# Patient Record
Sex: Male | Born: 1945 | Race: White | Hispanic: No | Smoking: Former smoker
Health system: Southern US, Community
[De-identification: ages and names within clinical notes are randomized; demographics above are authoritative.]

## PROBLEM LIST (undated history)

## (undated) DIAGNOSIS — F1910 Other psychoactive substance abuse, uncomplicated: Secondary | ICD-10-CM

## (undated) DIAGNOSIS — K219 Gastro-esophageal reflux disease without esophagitis: Secondary | ICD-10-CM

## (undated) HISTORY — PX: JOINT REPLACEMENT: SHX530

---

## 2020-11-28 ENCOUNTER — Inpatient Hospital Stay
Admission: EM | Admit: 2020-11-28 | Discharge: 2020-11-28 | DRG: 871 | Disposition: A | Discharge status: Other Acute Inpatient Hospital | Payer: Medicare Other | Attending: Internal Medicine | Admitting: Internal Medicine

## 2020-11-28 ENCOUNTER — Emergency Department: Payer: Medicare Other

## 2020-11-28 ENCOUNTER — Inpatient Hospital Stay: Payer: Medicare Other

## 2020-11-28 ENCOUNTER — Other Ambulatory Visit: Payer: Self-pay

## 2020-11-28 ENCOUNTER — Encounter: Payer: Self-pay | Admitting: Emergency Medicine

## 2020-11-28 DIAGNOSIS — I482 Chronic atrial fibrillation, unspecified: Secondary | ICD-10-CM | POA: Diagnosis present

## 2020-11-28 DIAGNOSIS — E872 Acidosis, unspecified: Secondary | ICD-10-CM | POA: Diagnosis present

## 2020-11-28 DIAGNOSIS — Z888 Allergy status to other drugs, medicaments and biological substances status: Secondary | ICD-10-CM | POA: Diagnosis not present

## 2020-11-28 DIAGNOSIS — S2241XA Multiple fractures of ribs, right side, initial encounter for closed fracture: Secondary | ICD-10-CM | POA: Diagnosis present

## 2020-11-28 DIAGNOSIS — D696 Thrombocytopenia, unspecified: Secondary | ICD-10-CM | POA: Diagnosis present

## 2020-11-28 DIAGNOSIS — Z20822 Contact with and (suspected) exposure to covid-19: Secondary | ICD-10-CM | POA: Diagnosis present

## 2020-11-28 DIAGNOSIS — Z87891 Personal history of nicotine dependence: Secondary | ICD-10-CM

## 2020-11-28 DIAGNOSIS — Z7901 Long term (current) use of anticoagulants: Secondary | ICD-10-CM | POA: Diagnosis not present

## 2020-11-28 DIAGNOSIS — W19XXXA Unspecified fall, initial encounter: Secondary | ICD-10-CM | POA: Diagnosis present

## 2020-11-28 DIAGNOSIS — K219 Gastro-esophageal reflux disease without esophagitis: Secondary | ICD-10-CM | POA: Diagnosis present

## 2020-11-28 DIAGNOSIS — S27391A Other injuries of lung, unilateral, initial encounter: Secondary | ICD-10-CM | POA: Diagnosis present

## 2020-11-28 DIAGNOSIS — I4891 Unspecified atrial fibrillation: Secondary | ICD-10-CM | POA: Diagnosis present

## 2020-11-28 DIAGNOSIS — R410 Disorientation, unspecified: Secondary | ICD-10-CM | POA: Diagnosis not present

## 2020-11-28 DIAGNOSIS — Z880 Allergy status to penicillin: Secondary | ICD-10-CM

## 2020-11-28 DIAGNOSIS — A419 Sepsis, unspecified organism: Secondary | ICD-10-CM | POA: Diagnosis present

## 2020-11-28 DIAGNOSIS — S271XXA Traumatic hemothorax, initial encounter: Secondary | ICD-10-CM | POA: Diagnosis present

## 2020-11-28 DIAGNOSIS — R296 Repeated falls: Secondary | ICD-10-CM

## 2020-11-28 DIAGNOSIS — I700 Atherosclerosis of aorta: Secondary | ICD-10-CM | POA: Diagnosis present

## 2020-11-28 DIAGNOSIS — Z91013 Allergy to seafood: Secondary | ICD-10-CM

## 2020-11-28 DIAGNOSIS — F1010 Alcohol abuse, uncomplicated: Secondary | ICD-10-CM | POA: Diagnosis present

## 2020-11-28 DIAGNOSIS — Z66 Do not resuscitate: Secondary | ICD-10-CM | POA: Diagnosis present

## 2020-11-28 DIAGNOSIS — J948 Other specified pleural conditions: Secondary | ICD-10-CM

## 2020-11-28 DIAGNOSIS — R4182 Altered mental status, unspecified: Secondary | ICD-10-CM | POA: Diagnosis present

## 2020-11-28 DIAGNOSIS — I10 Essential (primary) hypertension: Secondary | ICD-10-CM | POA: Diagnosis present

## 2020-11-28 DIAGNOSIS — J942 Hemothorax: Secondary | ICD-10-CM

## 2020-11-28 HISTORY — DX: Gastro-esophageal reflux disease without esophagitis: K21.9

## 2020-11-28 HISTORY — DX: Other psychoactive substance abuse, uncomplicated: F19.10

## 2020-11-28 LAB — URINALYSIS, ROUTINE W REFLEX MICROSCOPIC
Bacteria, UA: NONE SEEN
Bilirubin Urine: NEGATIVE
Glucose, UA: 50 mg/dL — AB
Ketones, ur: 80 mg/dL — AB
Leukocytes,Ua: NEGATIVE
Nitrite: NEGATIVE
Protein, ur: 100 mg/dL — AB
Specific Gravity, Urine: 1.027 (ref 1.005–1.030)
pH: 5 (ref 5.0–8.0)

## 2020-11-28 LAB — COMPREHENSIVE METABOLIC PANEL
ALT: 41 U/L (ref 0–44)
AST: 41 U/L (ref 15–41)
Albumin: 3.5 g/dL (ref 3.5–5.0)
Alkaline Phosphatase: 50 U/L (ref 38–126)
Anion gap: 17 — ABNORMAL HIGH (ref 5–15)
BUN: 25 mg/dL — ABNORMAL HIGH (ref 8–23)
CO2: 21 mmol/L — ABNORMAL LOW (ref 22–32)
Calcium: 9.4 mg/dL (ref 8.9–10.3)
Chloride: 99 mmol/L (ref 98–111)
Creatinine, Ser: 1.07 mg/dL (ref 0.61–1.24)
GFR, Estimated: >60 mL/min (ref >60)
Glucose, Bld: 154 mg/dL — ABNORMAL HIGH (ref 70–99)
Potassium: 4.1 mmol/L (ref 3.5–5.1)
Sodium: 137 mmol/L (ref 135–145)
Total Bilirubin: 3 mg/dL — ABNORMAL HIGH (ref 0.3–1.2)
Total Protein: 7.5 g/dL (ref 6.5–8.1)

## 2020-11-28 LAB — LIPID PANEL
Cholesterol: 113 mg/dL (ref 0–200)
HDL: 57 mg/dL (ref >40)
LDL Cholesterol: 43 mg/dL (ref 0–99)
Total CHOL/HDL Ratio: 2 RATIO
Triglycerides: 66 mg/dL (ref <150)
VLDL: 13 mg/dL (ref 0–40)

## 2020-11-28 LAB — CK: Total CK: 359 U/L (ref 49–397)

## 2020-11-28 LAB — CBC WITH DIFFERENTIAL/PLATELET
Abs Immature Granulocytes: 0.12 10*3/uL — ABNORMAL HIGH (ref 0.00–0.07)
Basophils Absolute: 0 10*3/uL (ref 0.0–0.1)
Basophils Relative: 0 %
Eosinophils Absolute: 0 10*3/uL (ref 0.0–0.5)
Eosinophils Relative: 0 %
HCT: 35.8 % — ABNORMAL LOW (ref 39.0–52.0)
Hemoglobin: 12.3 g/dL — ABNORMAL LOW (ref 13.0–17.0)
Immature Granulocytes: 1 %
Lymphocytes Relative: 7 %
Lymphs Abs: 0.7 10*3/uL (ref 0.7–4.0)
MCH: 35.4 pg — ABNORMAL HIGH (ref 26.0–34.0)
MCHC: 34.4 g/dL (ref 30.0–36.0)
MCV: 103.2 fL — ABNORMAL HIGH (ref 80.0–100.0)
Monocytes Absolute: 1 10*3/uL (ref 0.1–1.0)
Monocytes Relative: 10 %
Neutro Abs: 7.7 10*3/uL (ref 1.7–7.7)
Neutrophils Relative %: 82 %
Platelets: 133 10*3/uL — ABNORMAL LOW (ref 150–400)
RBC: 3.47 MIL/uL — ABNORMAL LOW (ref 4.22–5.81)
RDW: 15.4 % (ref 11.5–15.5)
WBC: 9.5 10*3/uL (ref 4.0–10.5)
nRBC: 0 % (ref 0.0–0.2)

## 2020-11-28 LAB — TROPONIN I (HIGH SENSITIVITY)
Troponin I (High Sensitivity): 43 ng/L — ABNORMAL HIGH (ref <18)
Troponin I (High Sensitivity): 36 ng/L — ABNORMAL HIGH (ref <18)

## 2020-11-28 LAB — RESP PANEL BY RT-PCR (FLU A&B, COVID) ARPGX2
Influenza A by PCR: NEGATIVE
Influenza B by PCR: NEGATIVE
SARS Coronavirus 2 by RT PCR: NEGATIVE

## 2020-11-28 LAB — LACTIC ACID, PLASMA
Lactic Acid, Venous: 3.2 mmol/L (ref 0.5–1.9)
Lactic Acid, Venous: 1.6 mmol/L (ref 0.5–1.9)

## 2020-11-28 LAB — TSH: TSH: 1.095 u[IU]/mL (ref 0.350–4.500)

## 2020-11-28 LAB — ETHANOL: Alcohol, Ethyl (B): <10 mg/dL (ref <10)

## 2020-11-28 IMAGING — MR MR HEAD W/O CM
11 series · 44 of 48 positions shown · non-contrast
Comparison: Prior head CT from earlier the same day.

CLINICAL DATA: Follow-up examination for acute stroke.  Found down.

EXAM:
MRI HEAD WITHOUT CONTRAST
TECHNIQUE: Multiplanar, multiecho pulse sequences of the brain and surrounding
structures were obtained without intravenous contrast.

[Series 9: ax dwi_tracew · axial · 3.0mm · 0.65mm/px · z∈[-132,+21]mm · 4 of 48 slices shown]
[im 1/48]
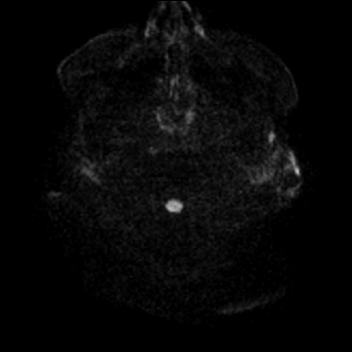
[im 16/48]
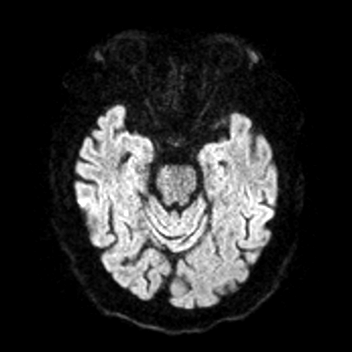
[im 32/48]
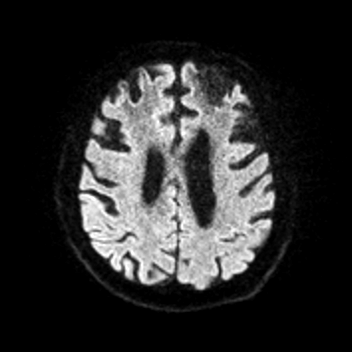
[im 48/48]
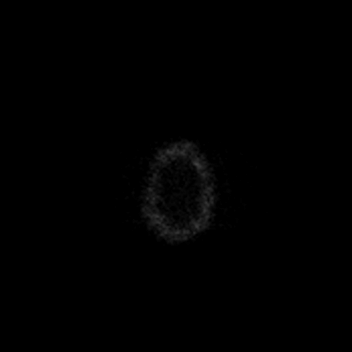

[Series 10: ax dwi_adc · axial · 3.0mm · 0.65mm/px · z∈[-132,+21]mm · 4 of 48 slices shown]
[im 1/48]
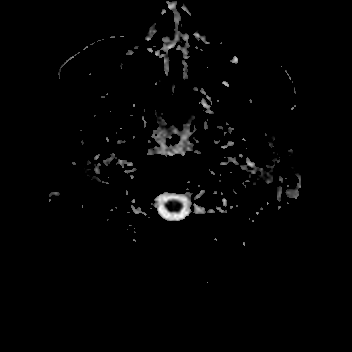
[im 16/48]
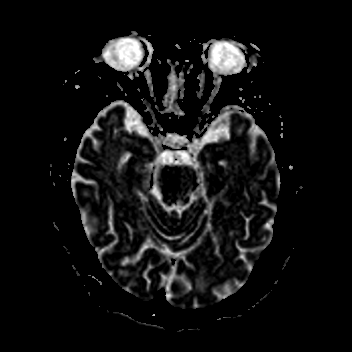
[im 32/48]
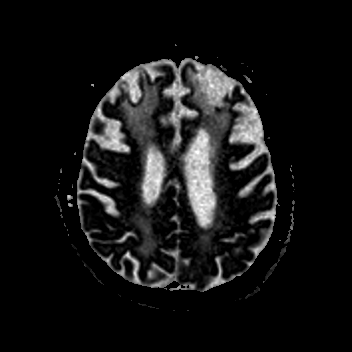
[im 48/48]
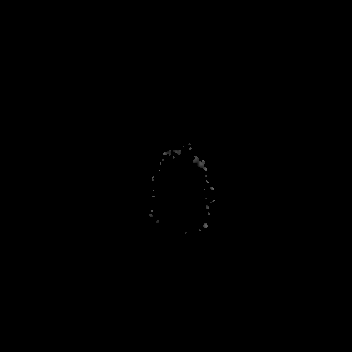

[Series 11: cor dwi_tracew · coronal · 5.0mm · 0.65mm/px · 3 of 40 slices shown]
[im 1/40]
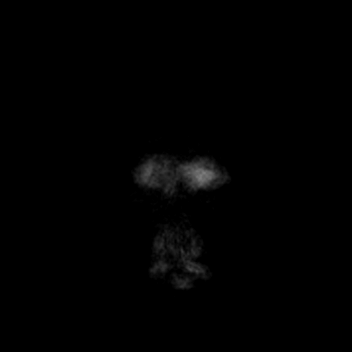
[im 20/40]
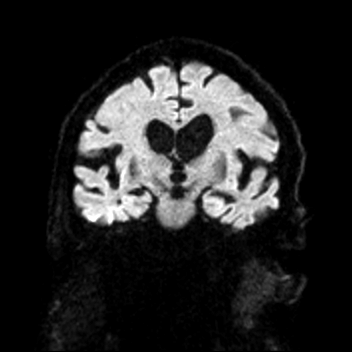
[im 40/40]
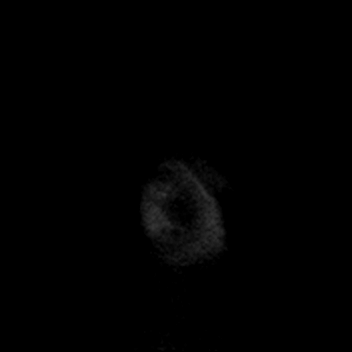

[Series 12: cor dwi_adc · coronal · 5.0mm · 0.65mm/px · 3 of 37 slices shown]
[im 1/37]
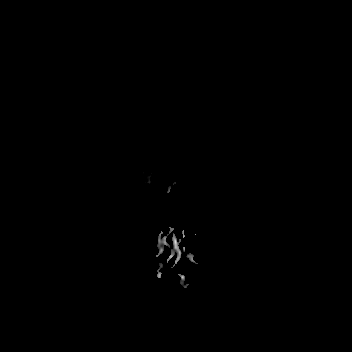
[im 19/37]
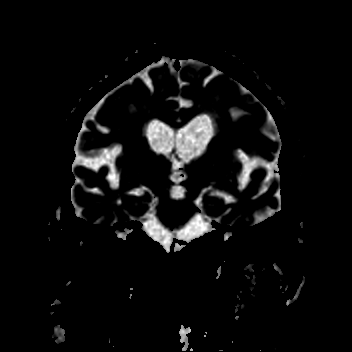
[im 37/37]
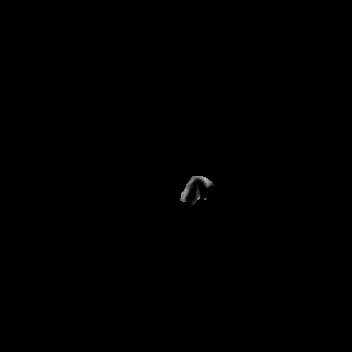

[Series 13: T1 · sagittal · 5.0mm · 0.62mm/px · 2 of 25 slices shown (1 of 2)]
[im 1/25]
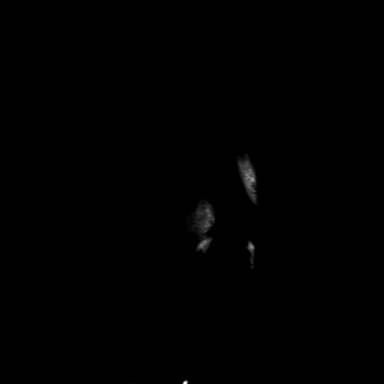
[im 25/25]
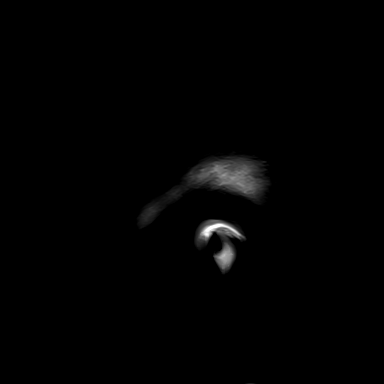

[Series 14: T2 · axial · 5.0mm · 0.53mm/px · z∈[-131,+23]mm · 2 of 27 slices shown (1 of 2)]
[im 1/27]
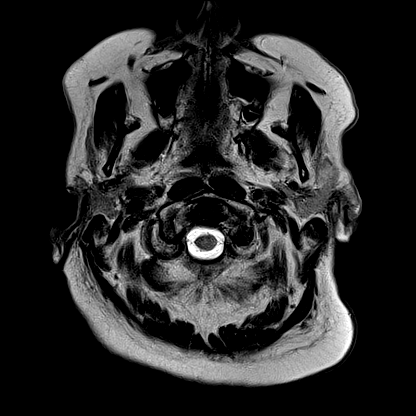
[im 27/27]
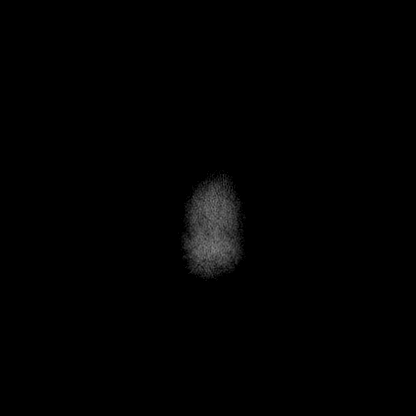

[Series 16: pha_images · axial · 3.0mm · 0.90mm/px · z∈[-142,+27]mm · 5 of 57 slices shown]
[im 1/57]
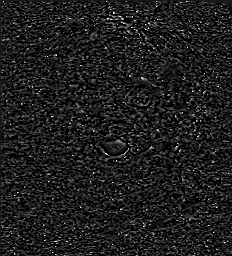
[im 15/57]
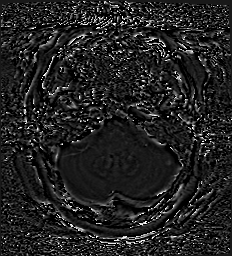
[im 29/57]
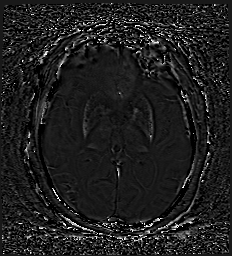
[im 43/57]
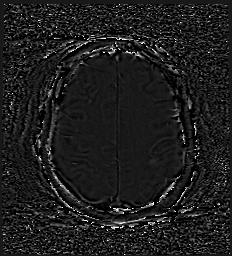
[im 57/57]
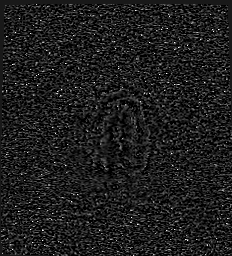

[Series 17: swi_images · axial · 3.0mm · 0.90mm/px · z∈[-142,+33]mm · 5 of 60 slices shown]
[im 1/60]
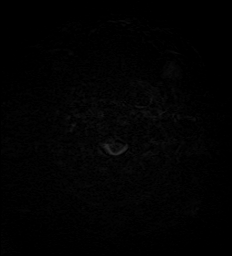
[im 15/60]
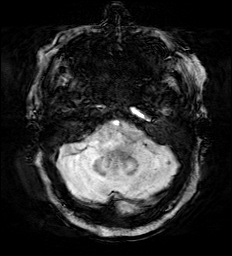
[im 30/60]
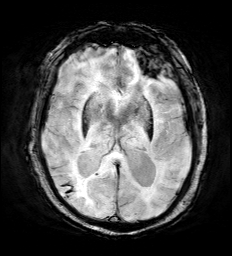
[im 45/60]
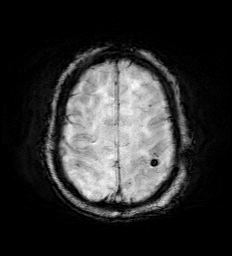
[im 60/60]
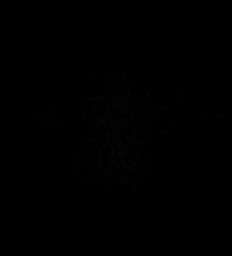

[Series 19: FLAIR · axial · 3.0mm · 0.53mm/px · z∈[-134,+26]mm · 4 of 55 slices shown]
[im 1/55]
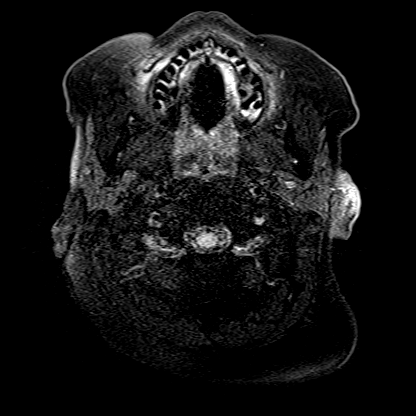
[im 19/55]
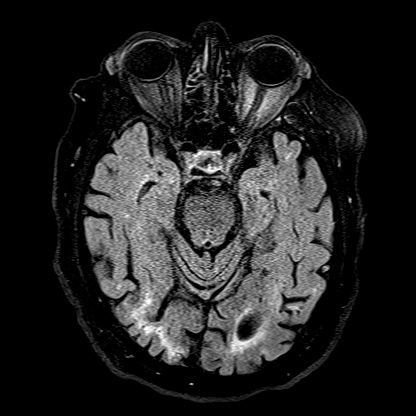
[im 37/55]
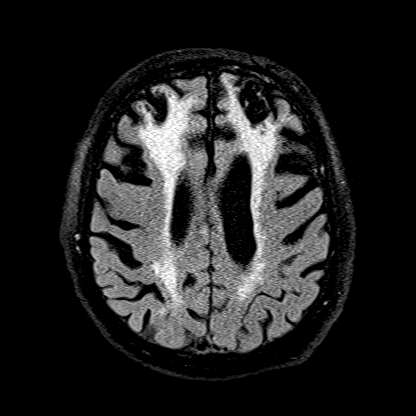
[im 55/55]
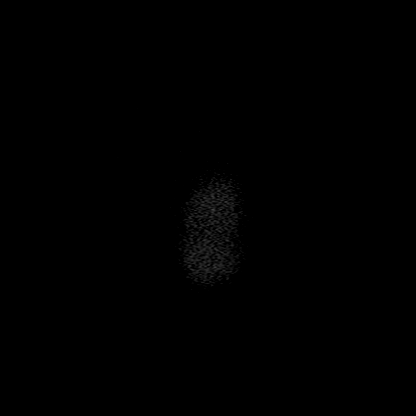

[Series 20: T1 · axial · 1.0mm · 0.98mm/px · z∈[-143,+30]mm · 10 of 176 slices shown (2 of 2)]
[im 1/176]
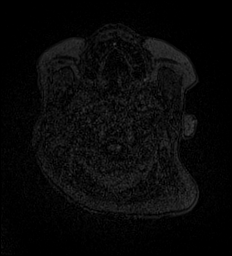
[im 14/176]
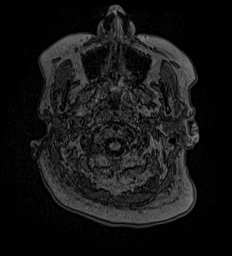
[im 27/176]
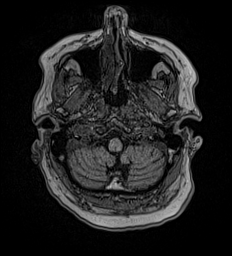
[im 41/176]
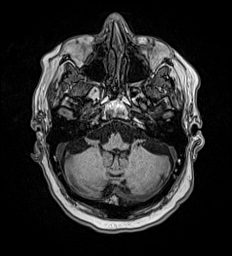
[im 54/176]
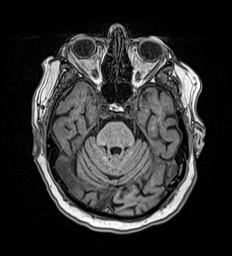
[im 81/176]
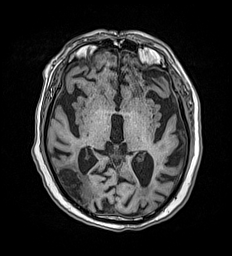
[im 95/176]
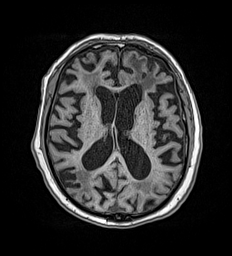
[im 122/176]
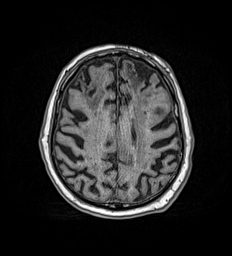
[im 149/176]
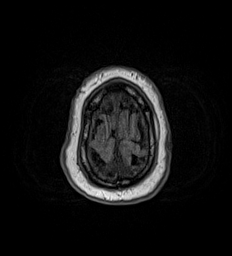
[im 176/176]
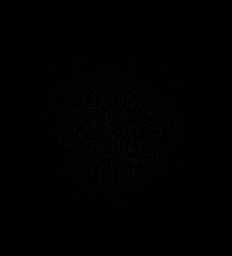

[Series 21: T2 · coronal · 5.0mm · 0.57mm/px · 2 of 30 slices shown (2 of 2)]
[im 1/30]
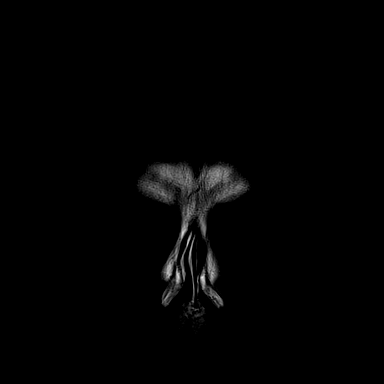
[im 30/30]
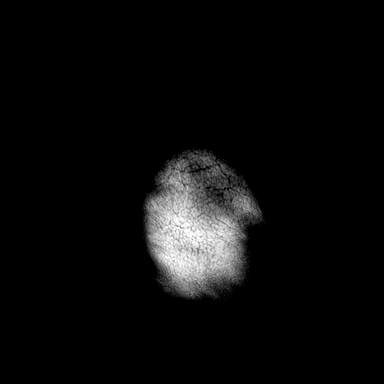

[44 of 48 positions shown; findings below may reference images not displayed]

FINDINGS: Brain: Diffuse prominence of the CSF containing spaces compatible
with generalized cerebral atrophy, moderately advanced in nature.
Extensive patchy and confluent T2/FLAIR hyperintensity involving the
periventricular and deep white matter both cerebral hemispheres most
consistent with chronic small vessel ischemic disease, moderate to
advanced in nature. Multiple scattered areas of encephalomalacia and
gliosis seen involving the bilateral frontal and right parietal
lobes, consistent with chronic ischemic infarcts. Few small remote
bilateral cerebellar infarcts noted, left greater than right.
Scattered chronic hemosiderin staining noted about several of these
chronic infarcts. Note made of a few additional small remote lacunar
infarcts about the deep gray nuclei.

Two small foci of restricted diffusion measuring up to 1 cm seen
involving the left cerebellum, consistent with acute ischemic
infarcts (series 9, image 9). No associated hemorrhage or mass
effect. No other evidence for acute or subacute ischemia. Gray-white
matter differentiation otherwise maintained. No acute intracranial
hemorrhage. Few additional chronic micro hemorrhages noted,
nonspecific, but could be hypertensive and/or small vessel related.

No mass lesion, midline shift or mass effect. Mild ventricular
prominence related to global parenchymal volume loss of
hydrocephalus. No extra-axial fluid collection. Pituitary gland and
suprasellar region normal.

Vascular: Major intracranial vascular flow voids are maintained.

Skull and upper cervical spine: Craniocervical junction normal. Bone
marrow signal intensity within normal limits. No scalp soft tissue
abnormality.

Sinuses/Orbits: Globes and orbital soft tissues within normal
limits. Paranasal sinuses are clear. No significant mastoid
effusion. Inner ear structures grossly normal.

Other: None.
IMPRESSION: 1. Two small acute ischemic nonhemorrhagic left cerebellar infarcts
measuring up to 1 cm.
2. Moderately advanced cerebral atrophy with underlying advanced
chronic microvascular ischemic disease with multiple remote infarcts
as above.

## 2020-11-28 IMAGING — CT CT HEAD W/O CM
3 series · 16 of 47 positions shown, 19 images · non-contrast
Comparison: None.

CLINICAL DATA: Syncope.  Normal neuro exam

EXAM:
CT HEAD WITHOUT CONTRAST
TECHNIQUE: Contiguous axial images were obtained from the base of the skull
through the vertex without intravenous contrast.

[Series 2: head wo · axial · 0.43mm/px · z∈[-95,+40]mm · 10 of 33 slices shown, 13 images]
[im 3/33  brain]
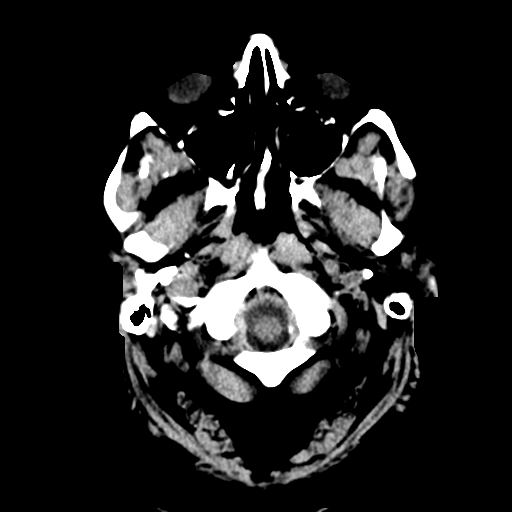
[im 3/33  bone]
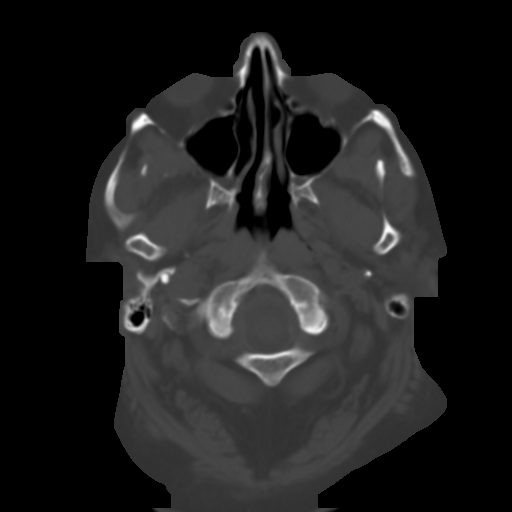
[im 6/33  brain]
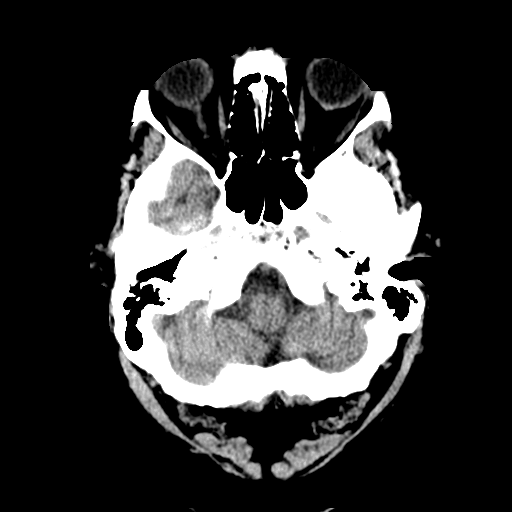
[im 9/33  brain]
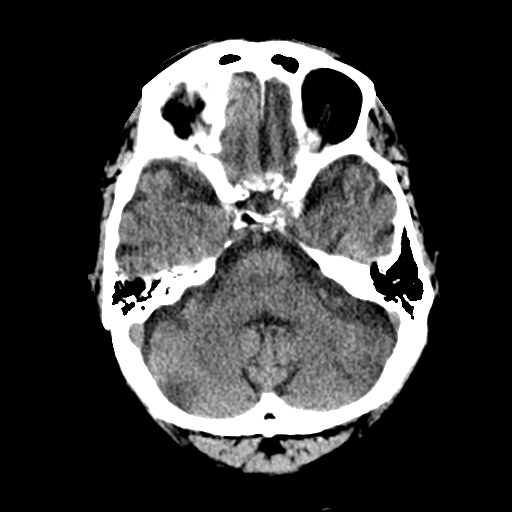
[im 12/33  brain]
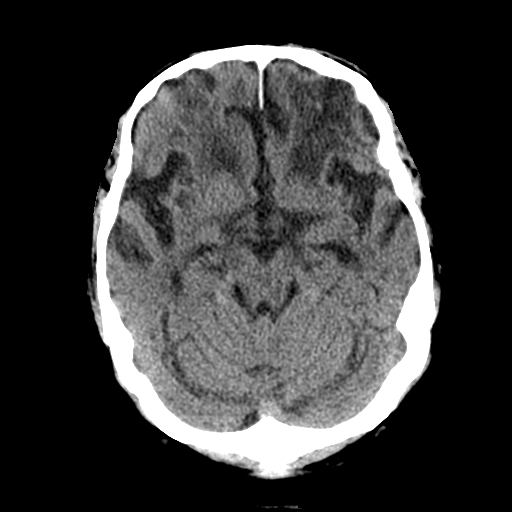
[im 15/33  brain]
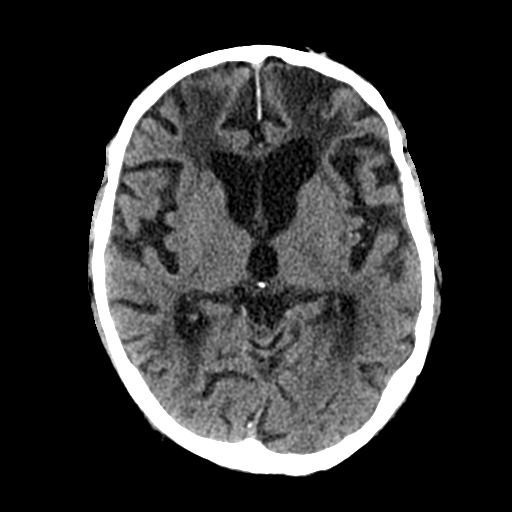
[im 15/33  bone]
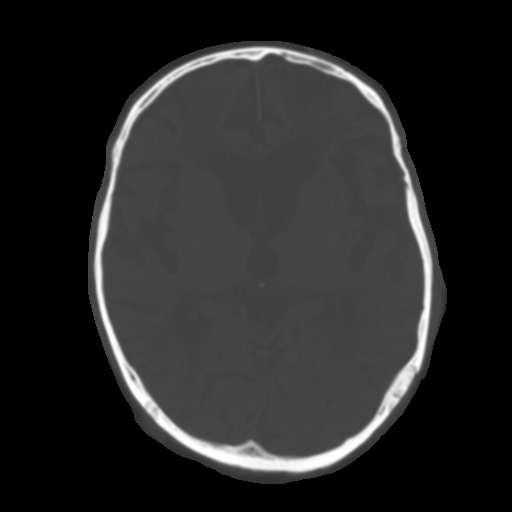
[im 18/33  brain]
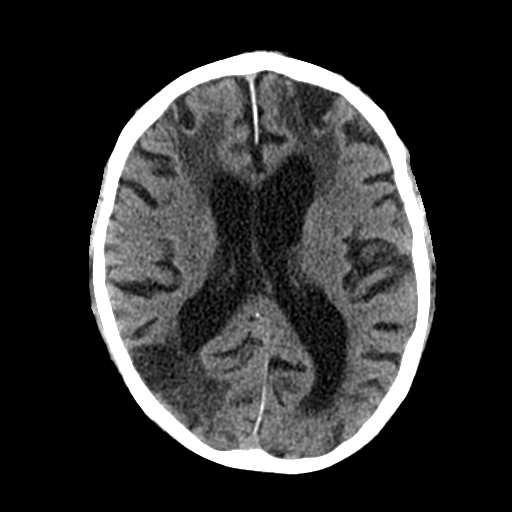
[im 21/33  brain]
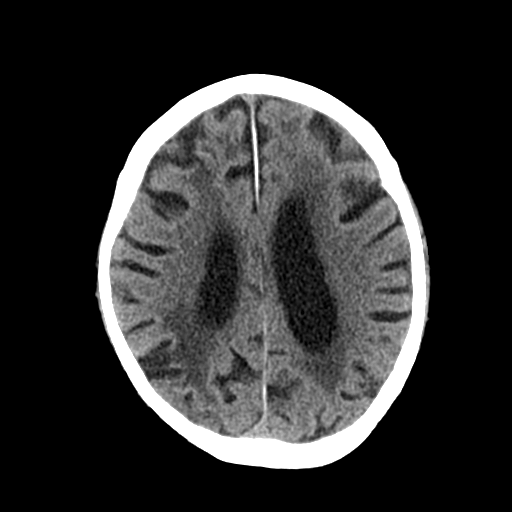
[im 25/33  brain]
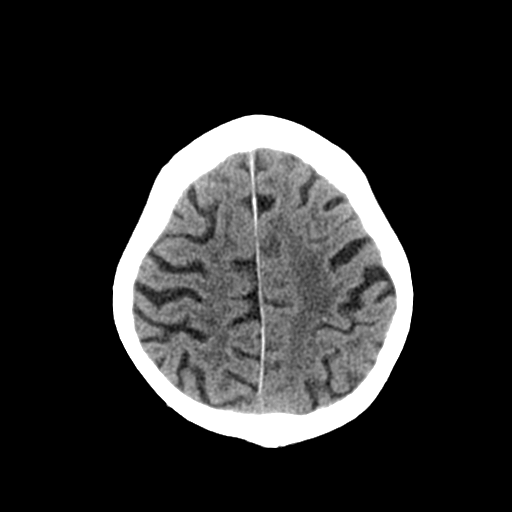
[im 27/33  brain]
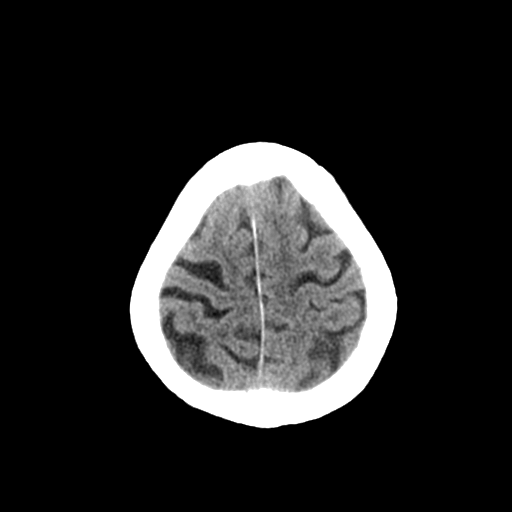
[im 27/33  bone]
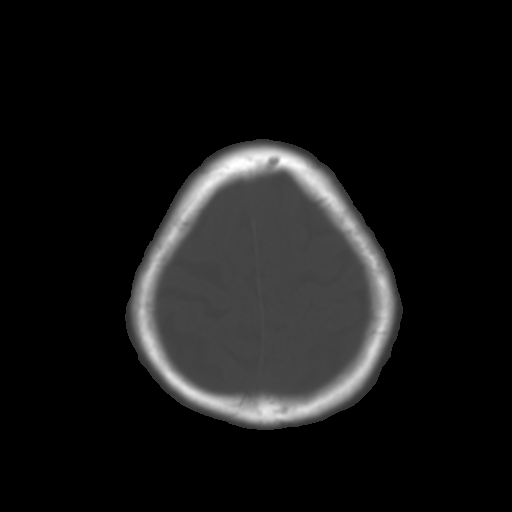
[im 30/33  brain]
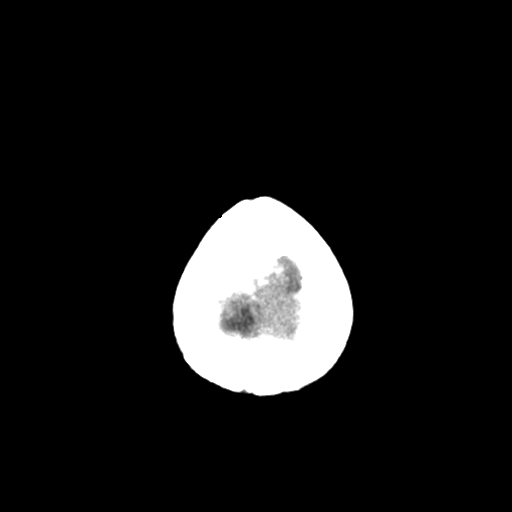

[Series 4: coronal soft tissue · coronal · 0.32mm/px · 3 of 69 slices shown]
[im 23/69  brain]
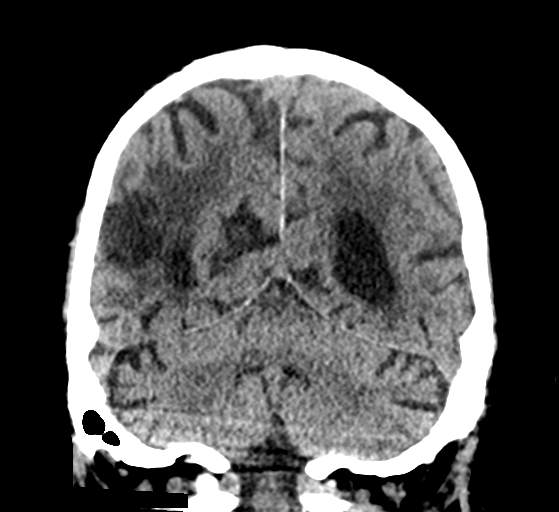
[im 31/69  brain]
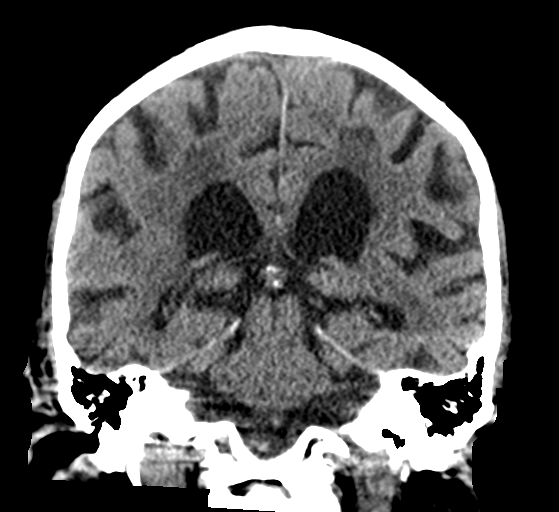
[im 38/69  brain]
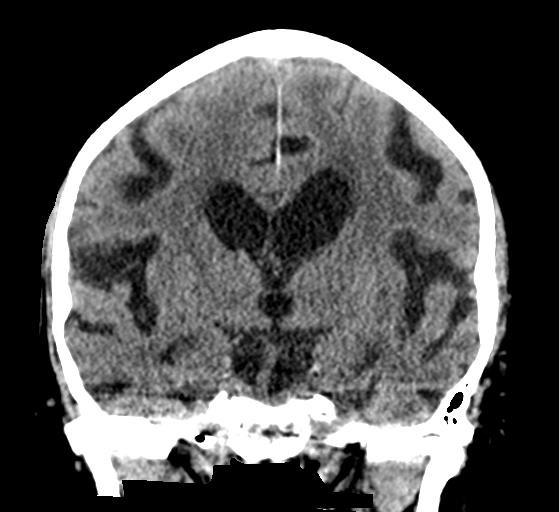

[Series 5: sagittal soft tissue · sagittal · 0.32mm/px · 3 of 60 slices shown]
[im 20/60  brain]
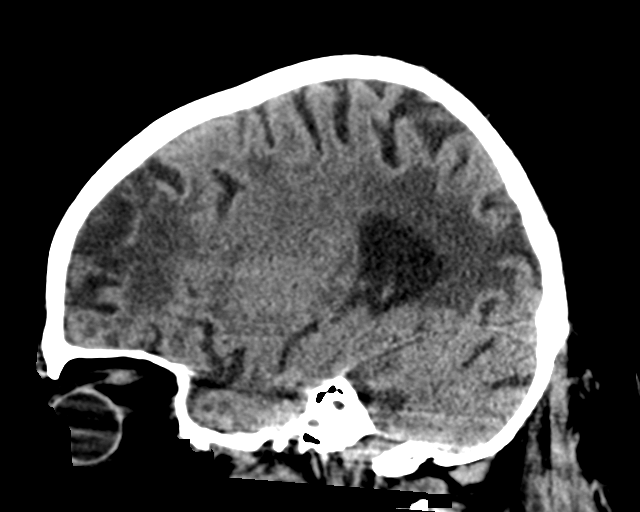
[im 30/60  brain]
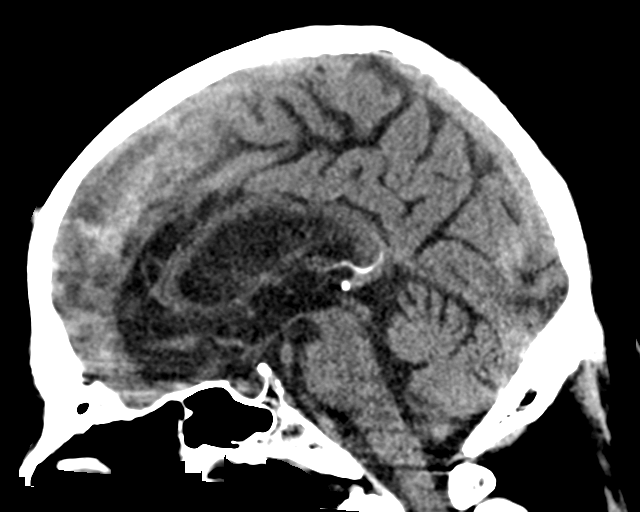
[im 40/60  brain]
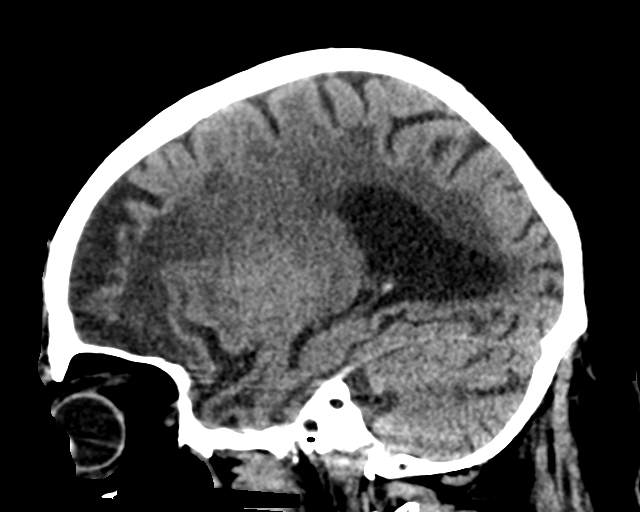

[16 of 47 positions shown; findings below may reference images not displayed]

FINDINGS: Brain: Moderate to advanced atrophy. Ventricular enlargement
consistent with the degree of atrophy.

Hypodensities in the frontal lobes bilaterally compatible with
chronic infarct. Hypodensity in the right parietal cortex also
consistent with chronic infarct. Hypodensity throughout the cerebral
white matter bilaterally compatible chronic ischemia.

Negative for acute cortical infarct, hemorrhage, mass.

Vascular: Negative for hyperdense vessel

Skull: Negative

Sinuses/Orbits: Negative

Other: None
IMPRESSION: Moderate to advanced atrophy.  Extensive chronic ischemic changes.

No acute abnormality detected.

## 2020-11-28 IMAGING — CT CT CHEST W/O CM
2 of 3 series · 15 of 36 positions shown, 18 images · non-contrast
Comparison: None.

CLINICAL DATA: chest trauma, minor.

EXAM:
CT CHEST WITHOUT CONTRAST
TECHNIQUE: Multidetector CT imaging of the chest was performed following the
standard protocol without IV contrast.

[Series 2: thorax · axial · 0.78mm/px · z∈[-501,-225]mm · 12 of 163 slices shown, 15 images]
[im 13/163  mediastinal]
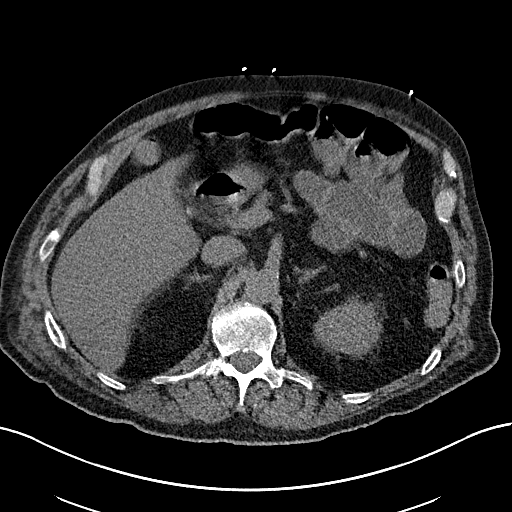
[im 13/163  lung]
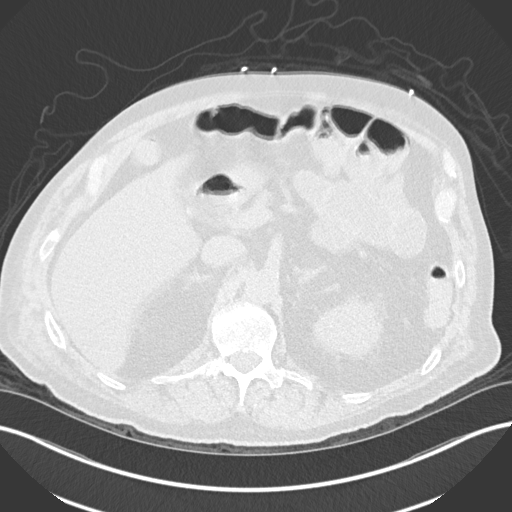
[im 25/163  lung]
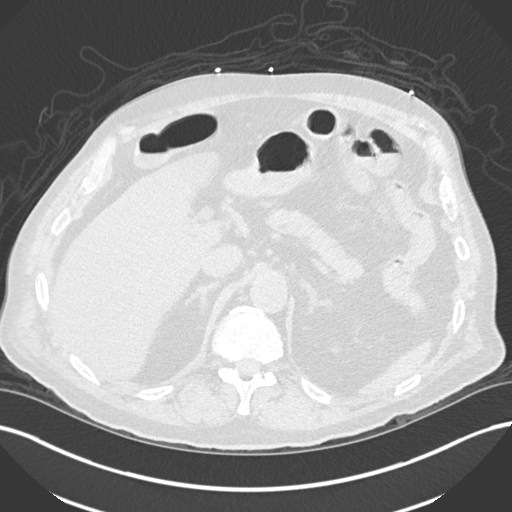
[im 37/163  lung]
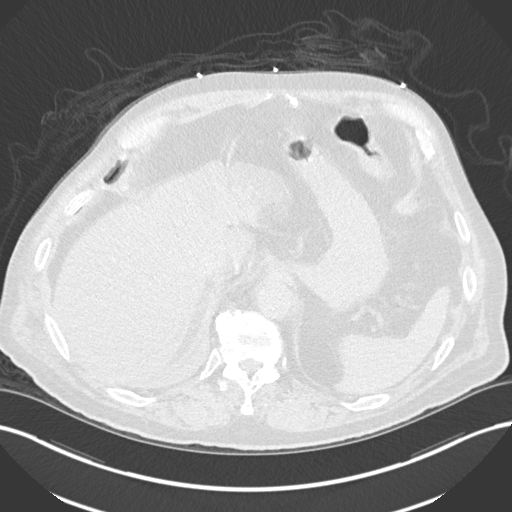
[im 49/163  lung]
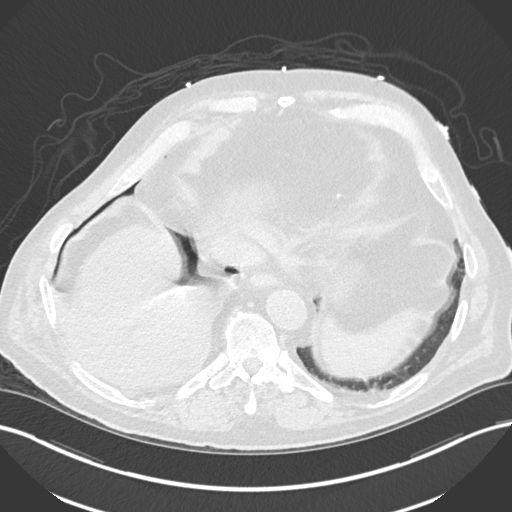
[im 61/163  mediastinal]
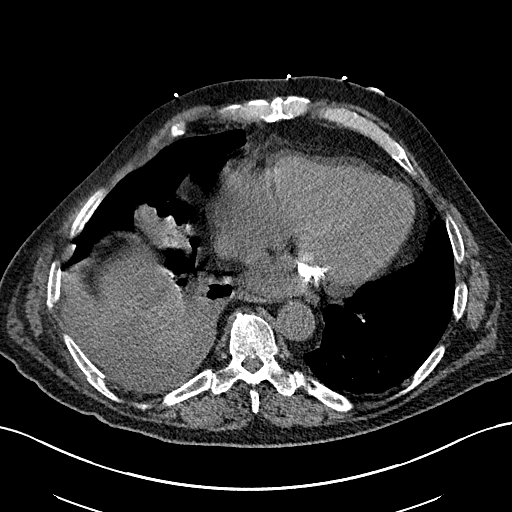
[im 61/163  lung]
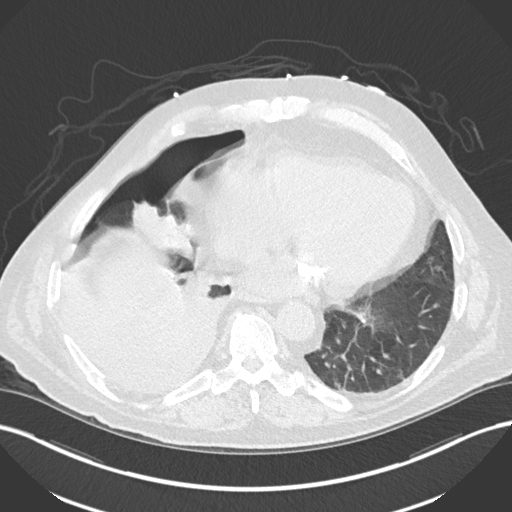
[im 73/163  lung]
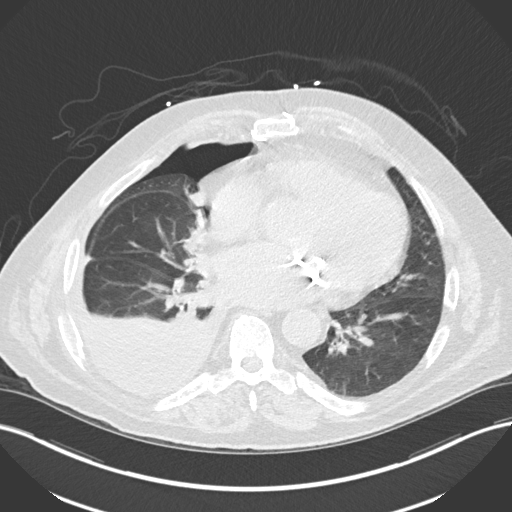
[im 91/163  lung]
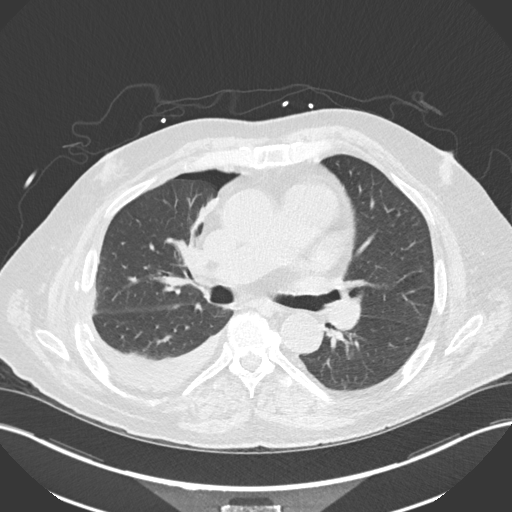
[im 103/163  lung]
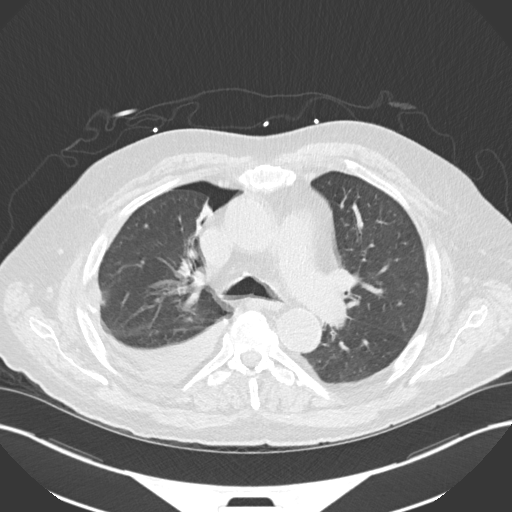
[im 115/163  mediastinal]
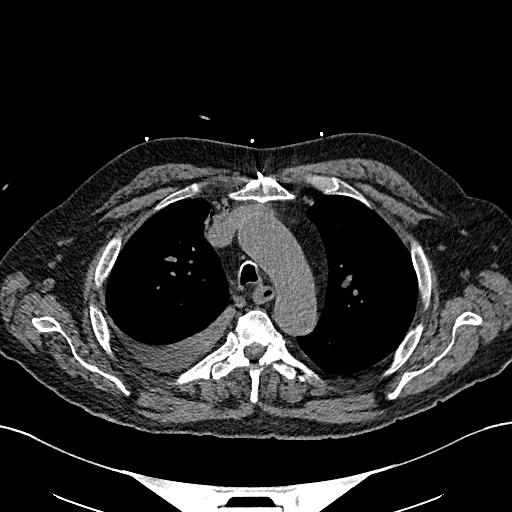
[im 115/163  lung]
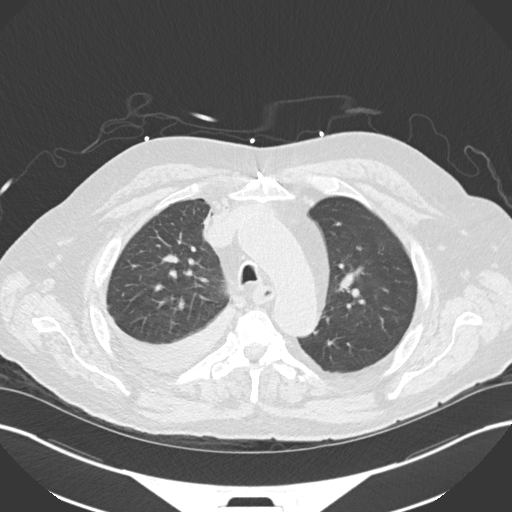
[im 127/163  lung]
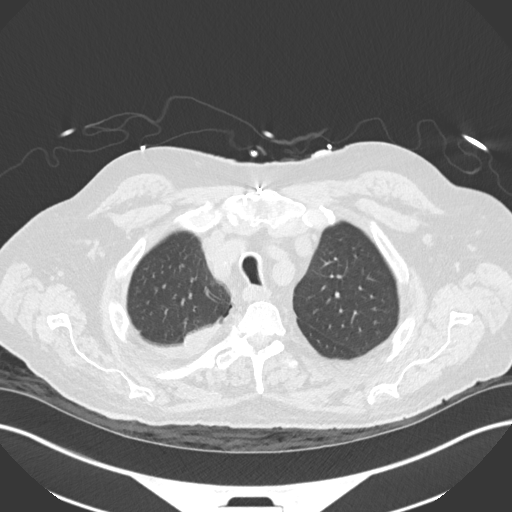
[im 139/163  lung]
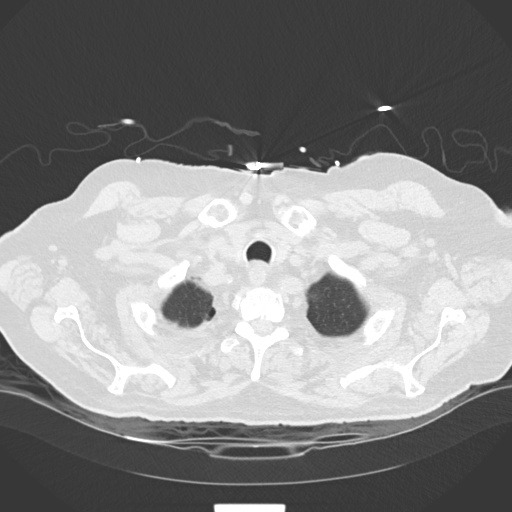
[im 151/163  lung]
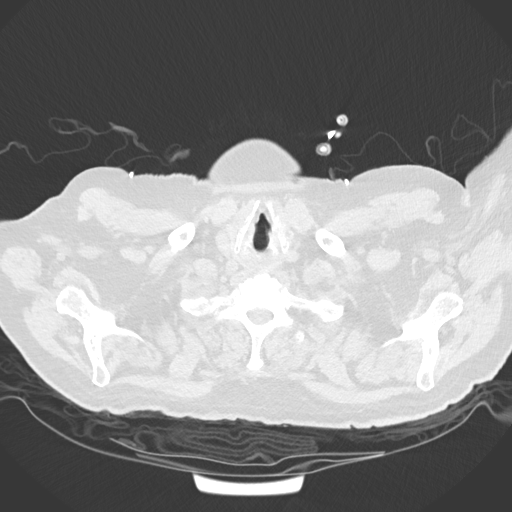

[Series 5: coronal · coronal · 0.68mm/px · 3 of 147 slices shown]
[im 30/147  lung]
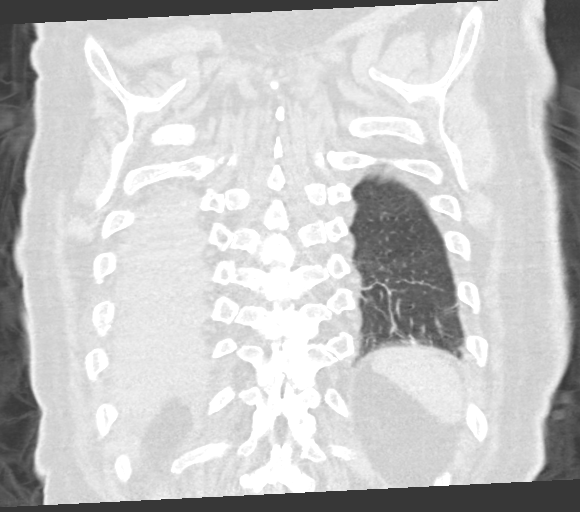
[im 59/147  lung]
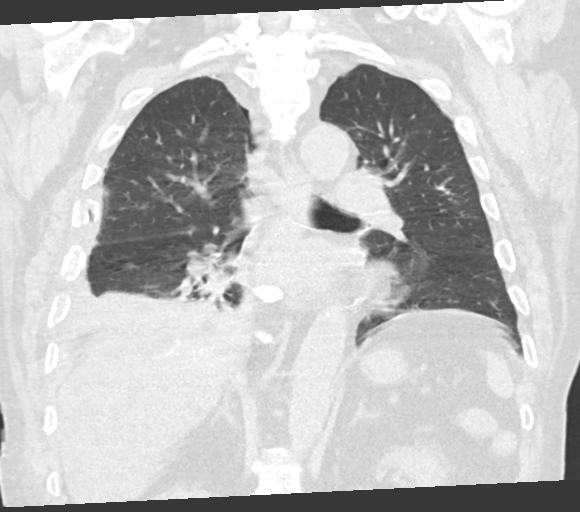
[im 88/147  lung]
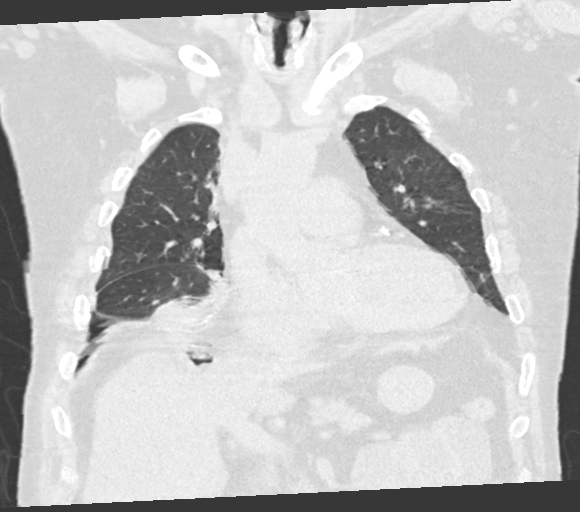

[15 of 36 positions shown; findings below may reference images not displayed]

FINDINGS: Evaluation of the lung bases is degraded by respiratory motion.

Cardiovascular: Aortic atherosclerosis without aneurysmal dilation.
Cardiac enlargement. Three-vessel coronary artery calcifications.
Prostatic mitral valve.

Mediastinum/Nodes: No pathologically enlarged mediastinal, hilar or
axillary lymph nodes, noting limited sensitivity for the detection
of hilar adenopathy on this noncontrast study. No discrete thyroid
nodule. The trachea and esophagus are unremarkable.

Lungs/Pleura: Moderate right hydropneumothorax with adjacent
airspace opacities. Left basilar atelectasis.

Upper Abdomen: No acute abnormality.

Musculoskeletal: Nondisplaced fourth left lateral rib fracture.
Displaced fifth, sixth and seventh left lateral rib fractures.
Nondisplaced eighth, ninth and tenth left lateral rib fractures.
Prior median sternotomy.
IMPRESSION: 1. Moderate right hydropneumothorax with adjacent airspace
opacities, likely reflecting atelectasis.
2. Multiple right-sided rib fractures as described above.
3. Cardiac enlargement with three-vessel coronary artery
calcifications.
4. Aortic Atherosclerosis ([9S]-[9S]).

These results were called by telephone at the time of interpretation
on [DATE] at [DATE] to provider MILEY , who verbally
acknowledged these results.

## 2020-11-28 IMAGING — CR DG CHEST 2V
2 series · 2 of 2 positions shown · non-contrast
Comparison: None.

CLINICAL DATA: Weakness.

EXAM:
CHEST - 2 VIEW

[chest ap]
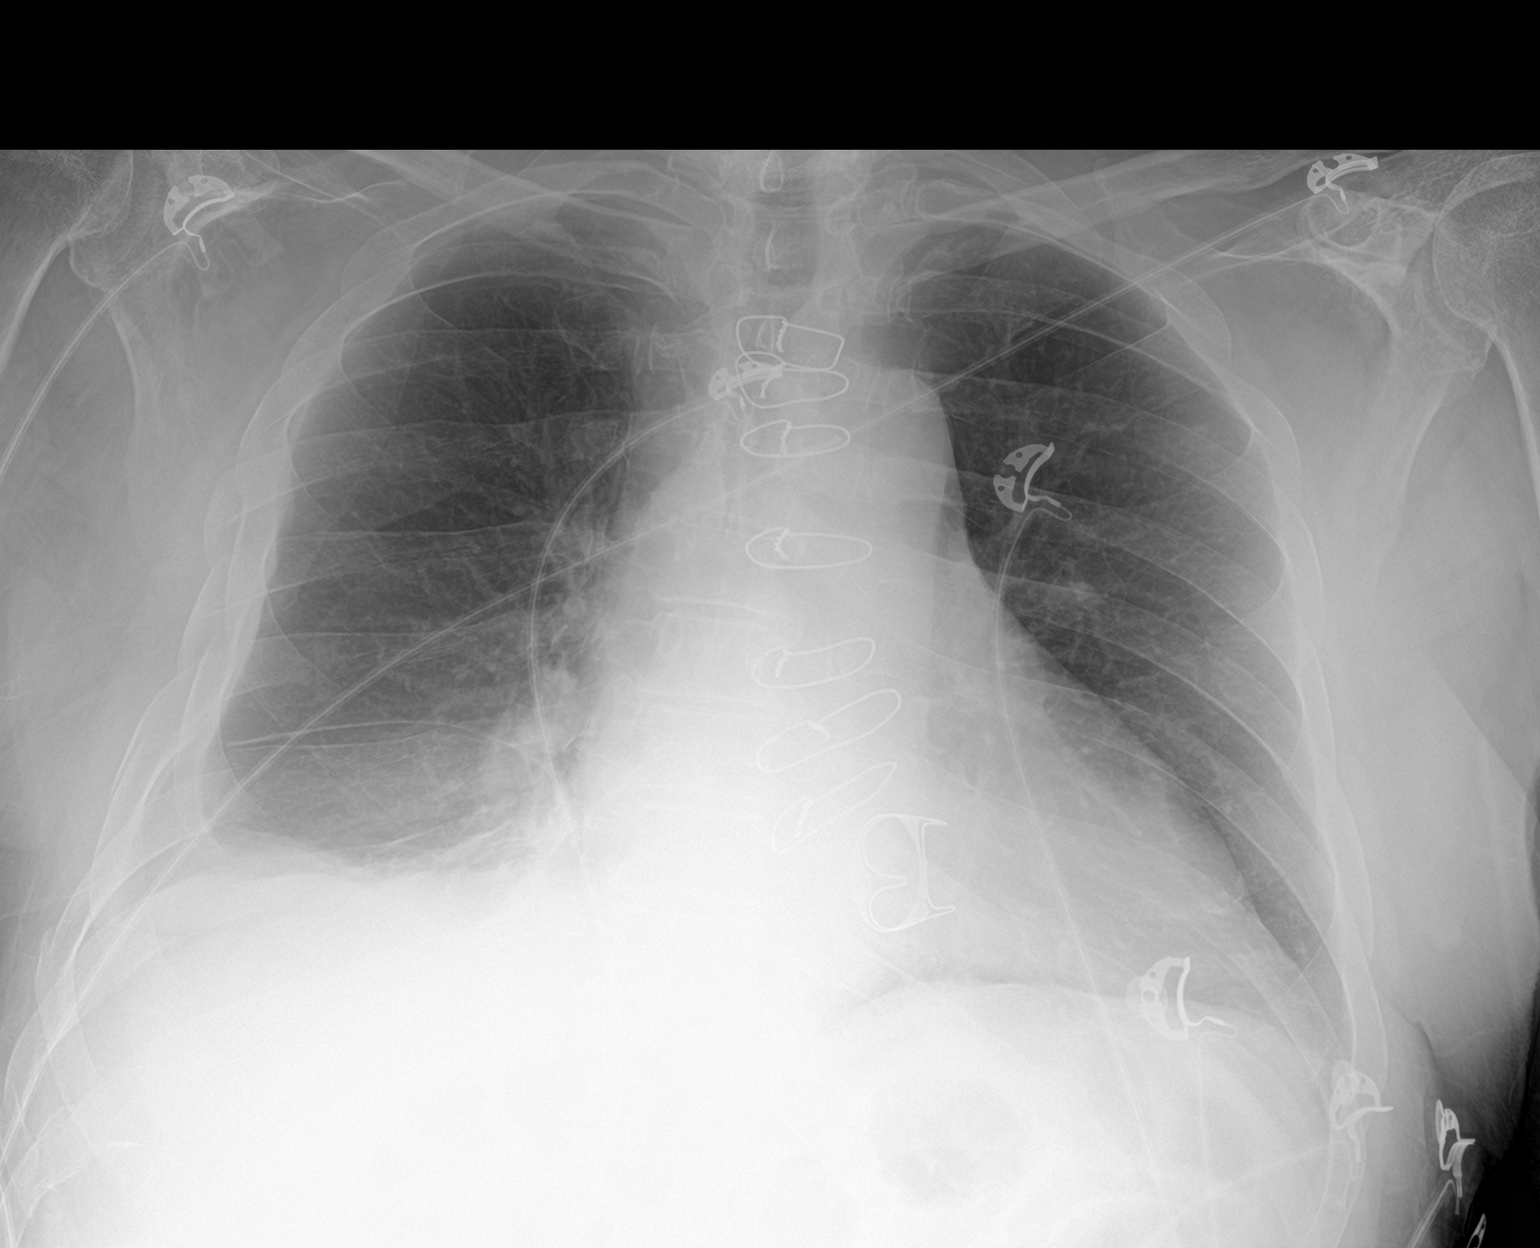

[chest lat]
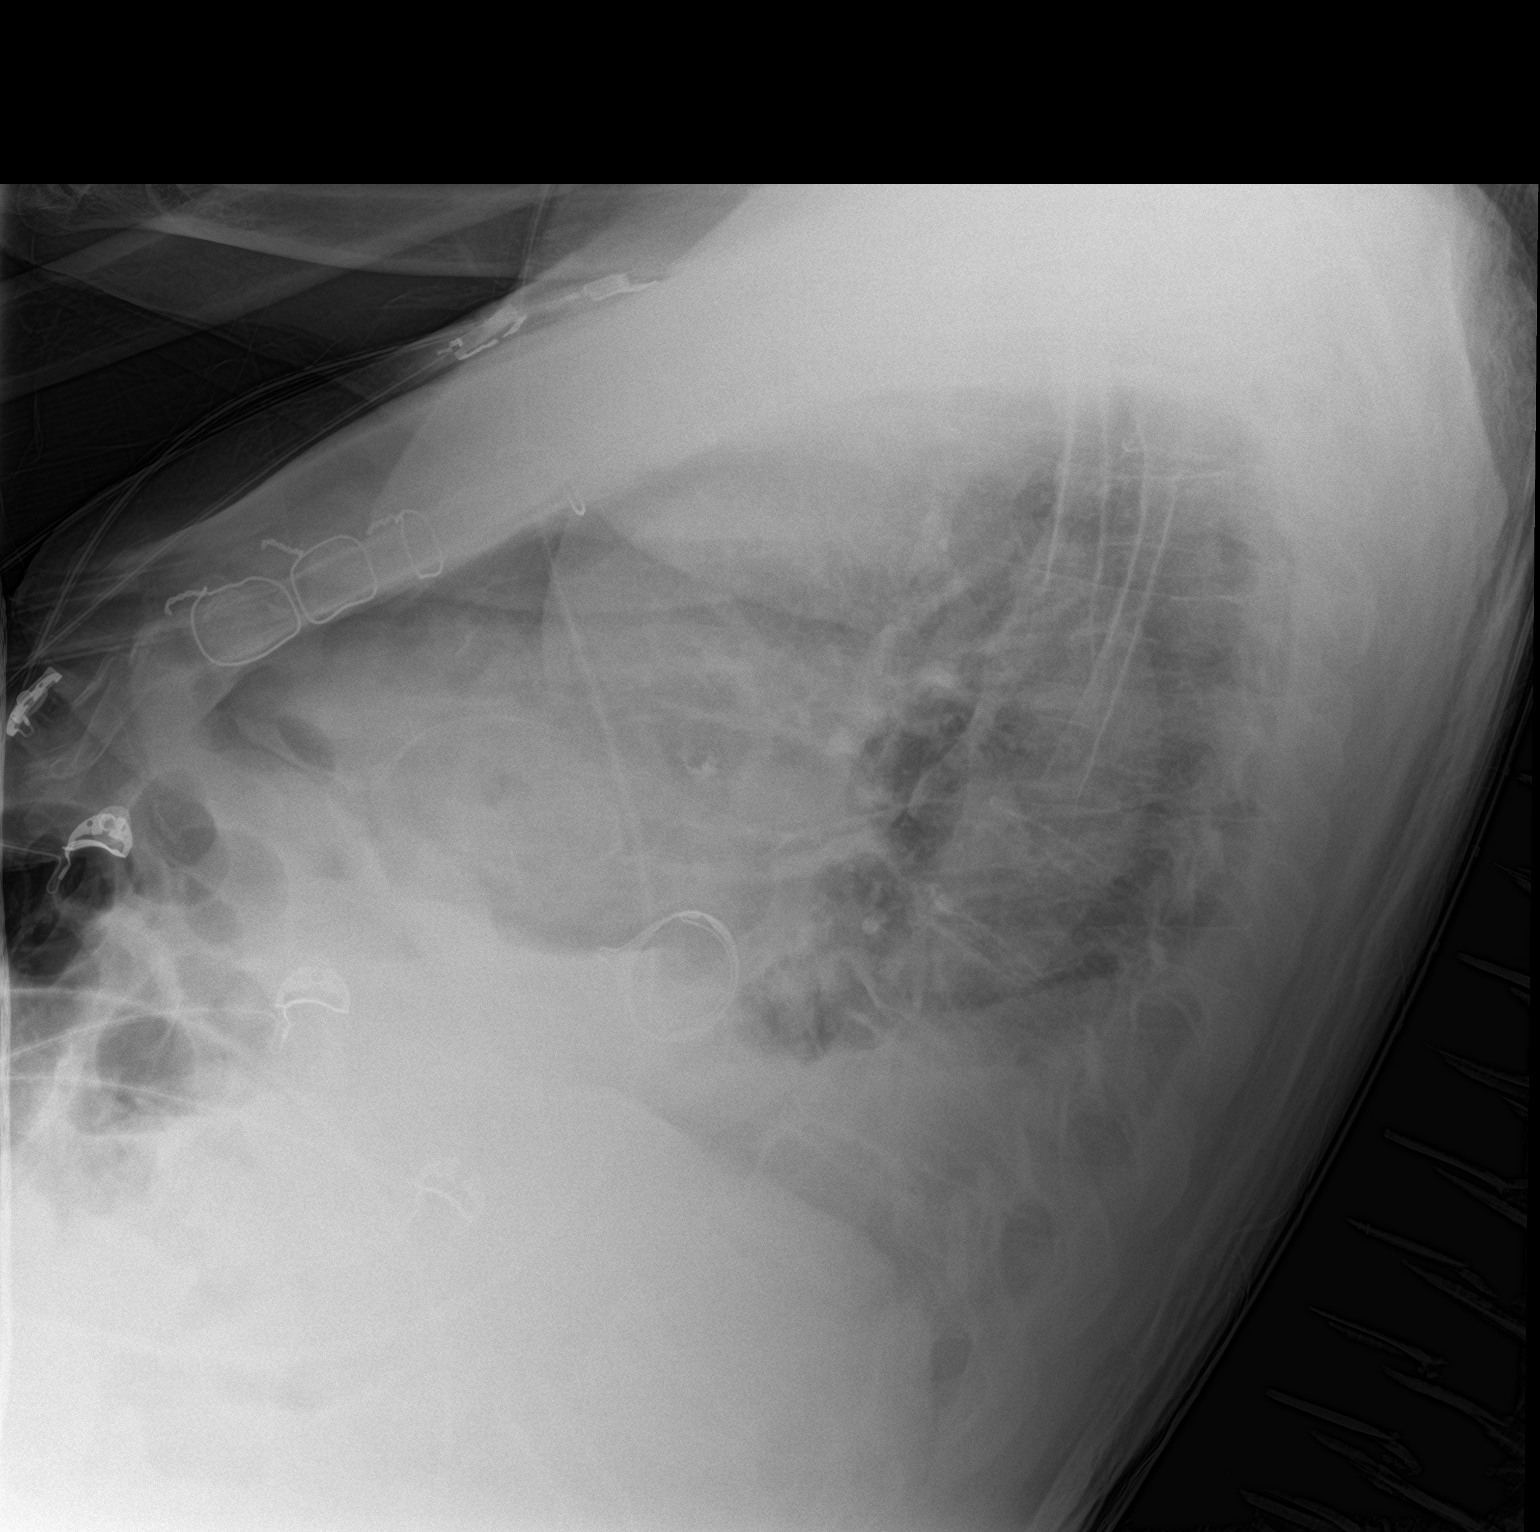

[2 of 2 positions shown; findings below may reference images not displayed]

FINDINGS: Status post cardiac valve repair. No pneumothorax is noted. Small
right pleural effusion is noted with associated right basilar
atelectasis. Displaced right sixth, seventh and eighth rib fractures
are noted.
IMPRESSION: Multiple right rib fractures are noted with associated right basilar
atelectasis and small right pleural effusion.

## 2020-11-28 MED ORDER — VANCOMYCIN HCL 750 MG/150ML IV SOLN
750.0000 mg | Freq: Once | INTRAVENOUS | Status: AC
  Administered 2020-11-28: 750 mg via INTRAVENOUS
  Filled 2020-11-28: qty 150

## 2020-11-28 MED ORDER — VANCOMYCIN HCL IN DEXTROSE 1-5 GM/200ML-% IV SOLN
1000.0000 mg | Freq: Once | INTRAVENOUS | Status: AC
  Administered 2020-11-28: 1000 mg via INTRAVENOUS

## 2020-11-28 MED ORDER — ONDANSETRON HCL 4 MG/2ML IJ SOLN: 4.0000 mg | Freq: Four times a day (QID) | INTRAMUSCULAR | Status: DC | PRN

## 2020-11-28 MED ORDER — LORAZEPAM 2 MG/ML IJ SOLN: 0.0000 mg | Freq: Two times a day (BID) | INTRAMUSCULAR | Status: DC

## 2020-11-28 MED ORDER — ONDANSETRON HCL 4 MG PO TABS: 4.0000 mg | ORAL_TABLET | Freq: Four times a day (QID) | ORAL | Status: DC | PRN

## 2020-11-28 MED ORDER — THIAMINE HCL 100 MG/ML IJ SOLN: 100.0000 mg | Freq: Every day | INTRAMUSCULAR | Status: DC

## 2020-11-28 MED ORDER — VANCOMYCIN HCL IN DEXTROSE 1-5 GM/200ML-% IV SOLN
1000.0000 mg | Freq: Once | INTRAVENOUS | Status: DC
  Filled 2020-11-28: qty 200

## 2020-11-28 MED ORDER — THIAMINE HCL 100 MG PO TABS
100.0000 mg | ORAL_TABLET | Freq: Every day | ORAL | Status: DC
  Administered 2020-11-28: 100 mg via ORAL
  Filled 2020-11-28: qty 1

## 2020-11-28 MED ORDER — FOLIC ACID 1 MG PO TABS
1.0000 mg | ORAL_TABLET | Freq: Every day | ORAL | Status: DC
  Administered 2020-11-28: 1 mg via ORAL
  Filled 2020-11-28: qty 1

## 2020-11-28 MED ORDER — SODIUM CHLORIDE 0.9 % IV SOLN: 2.0000 g | Freq: Three times a day (TID) | INTRAVENOUS | Status: DC

## 2020-11-28 MED ORDER — LORAZEPAM 2 MG PO TABS: 0.0000 mg | ORAL_TABLET | Freq: Two times a day (BID) | ORAL | Status: DC

## 2020-11-28 MED ORDER — ASPIRIN 325 MG PO TABS
325.0000 mg | ORAL_TABLET | Freq: Every day | ORAL | Status: DC
  Filled 2020-11-28 (×2): qty 1

## 2020-11-28 MED ORDER — ATORVASTATIN CALCIUM 20 MG PO TABS: 40.0000 mg | ORAL_TABLET | Freq: Every day | ORAL | Status: DC

## 2020-11-28 MED ORDER — METRONIDAZOLE 500 MG/100ML IV SOLN
500.0000 mg | Freq: Once | INTRAVENOUS | Status: AC
  Administered 2020-11-28: 500 mg via INTRAVENOUS
  Filled 2020-11-28: qty 100

## 2020-11-28 MED ORDER — LORAZEPAM 2 MG PO TABS: 0.0000 mg | ORAL_TABLET | Freq: Four times a day (QID) | ORAL | Status: DC

## 2020-11-28 MED ORDER — LACTATED RINGERS IV BOLUS (SEPSIS)
1000.0000 mL | Freq: Once | INTRAVENOUS | Status: AC
  Administered 2020-11-28: 1000 mL via INTRAVENOUS

## 2020-11-28 MED ORDER — LACTATED RINGERS IV BOLUS (SEPSIS)
500.0000 mL | Freq: Once | INTRAVENOUS | Status: AC
  Administered 2020-11-28: 500 mL via INTRAVENOUS

## 2020-11-28 MED ORDER — ACETAMINOPHEN 325 MG PO TABS: 650.0000 mg | ORAL_TABLET | Freq: Four times a day (QID) | ORAL | Status: DC | PRN

## 2020-11-28 MED ORDER — ACETAMINOPHEN 325 MG RE SUPP: 650.0000 mg | Freq: Four times a day (QID) | RECTAL | Status: DC | PRN

## 2020-11-28 MED ORDER — VANCOMYCIN HCL 1750 MG/350ML IV SOLN: 1750.0000 mg | INTRAVENOUS | Status: DC

## 2020-11-28 MED ORDER — METRONIDAZOLE 500 MG/100ML IV SOLN: 500.0000 mg | Freq: Two times a day (BID) | INTRAVENOUS | Status: DC

## 2020-11-28 MED ORDER — SODIUM CHLORIDE 0.9 % IV SOLN: 2.0000 g | Freq: Once | INTRAVENOUS | Status: DC

## 2020-11-28 MED ORDER — LORAZEPAM 2 MG/ML IJ SOLN: 0.0000 mg | Freq: Four times a day (QID) | INTRAMUSCULAR | Status: DC

## 2020-11-28 MED ORDER — SODIUM CHLORIDE 0.9 % IV SOLN
2.0000 g | Freq: Once | INTRAVENOUS | Status: AC
  Administered 2020-11-28: 2 g via INTRAVENOUS
  Filled 2020-11-28: qty 2

## 2020-11-28 MED ORDER — VANCOMYCIN HCL IN DEXTROSE 1-5 GM/200ML-% IV SOLN: 1000.0000 mg | Freq: Once | INTRAVENOUS | Status: DC

## 2020-11-28 MED ORDER — LACTATED RINGERS IV SOLN: INTRAVENOUS | Status: DC

## 2020-11-28 NOTE — ED Notes (Signed)
[facility] to transport to [facility]. [facility] aware of changes with transport agency.

## 2020-11-28 NOTE — ED Notes (Signed)
Called [facility] for transfer to Trauma  spoke to [employer]  [phone]

## 2020-11-28 NOTE — Progress Notes (Signed)
Pharmacy Antibiotic Note  Christian Clark is a 75 y.o. male admitted on 11/28/2020 with sepsis.  Pharmacy has been consulted for vancomycin and aztreonam dosing. Pt has reported allergy to PCN (hives). Pt has had cefdinir in the past (06/2020) - will change aztreonam to cefepime per protocol.   Plan: Cefepime 2 g IV q8h Pt received vancomycin 1750 mg IV x 1 loading dose. Will order 1500 mg IV q24h Est AUC: 466 Used: Scr 1.07, Vd 0.72, IBW Obtain vanc level around 4th or 5th dose if continued Plan for 7 days Monitor renal function and adjust dose as clinically indicated  Height: 5\' 10"  (177.8 cm) Weight: 79.4 kg (175 lb) IBW/kg (Calculated) : 73  Temp (24hrs), Avg:97.9 F (36.6 C), Min:97.9 F (36.6 C), Max:97.9 F (36.6 C)  Recent Labs  Lab 11/28/20 1325 11/28/20 1553  WBC 9.5  --   CREATININE 1.07  --   LATICACIDVEN 3.2* 1.6    Estimated Creatinine Clearance: 62.5 mL/min (by C-G formula based on SCr of 1.07 mg/dL).    Allergies  Allergen Reactions   Iodine Hives   Penicillins Hives   Shellfish Allergy Hives    Antimicrobials this admission: 11/10 aztreonam x1 11/10 metronidazole >>  11/10 cefepime >> 11/10 vancomycin >>   Dose adjustments this admission:  Microbiology results: 11/10 BCx: sent  Thank you for allowing pharmacy to be a part of this patient's care.  13/10 Christian Clark 11/28/2020 5:28 PM

## 2020-11-28 NOTE — ED Notes (Signed)
Pt to be transported to [facility] ED for chest tube procedure. This RN gave report to Cheral Almas at [employer] [facility] [employer] w/  [employer].  Christa stated that she has no available transport at this time and will call around for options and will call next RN at [phone] with update. Contact at [facility] after shift change will be Dorene Sorrow.  Contact at [facility] ED will be charge Landon.

## 2020-11-28 NOTE — H&P (Addendum)
History and Physical    Christian Clark [id number] DOB: [birth year] DOA: 11/28/2020  PCP: [facility]   Patient coming from: Home  I have personally briefly reviewed patient's old medical records in [place]  Chief Complaint: Falls  Most of the history was obtained from patient's son Christian Clark over the phone. HPI: Christian Clark is a 75 y.o. male with medical history significant for alcohol abuse (daily use of wine and vodka), history of GERD who presents to the emergency room via EMS after he was found on the floor by his son. Patient son states that he have been unable to reach him for the last 2 days.  Usually he stays in contact with him through phone calls but for the last 2 days no one has been able to contact the patient over the phone. Someone went over to check on him 1 day prior to his admission and noted that he appeared to be confused.  Patient's son also states that prior to the last 2 days, most people who talk to his dad over the phone noted that he appeared confused. His son went over to his house today to check on him and found him sleeping on the floor.  Patient was disoriented and told him he was in the hospital.  He had urine and feces all over him.  His son attempted to help him get up but patient was too weak and so his son called EMS who transported patient to the hospital. Patient tells me he has had 2 falls in the last 2 weeks.  He denies hitting his head but states that one of the falls he landed on his right side and has had right lateral chest wall pain. He denies having any nausea, no vomiting, no dizziness, no lightheadedness, no nausea, no vomiting, no palpitations, no diaphoresis, no cough, no urinary symptoms, no blurred vision, no focal deficit, no fever, no chills. Labs show sodium 137, potassium 4.9, chloride 99, bicarb 21, glucose 154, BUN 25, creatinine 1.07, calcium 9.4, anion gap 17, alkaline phosphatase 58, albumin  3.5, AST 41, ALT 41, total protein 7.5, total CK3 59, troponin 43 >> 36, lactic acid 3.2 >> 1.6, white count 9.5, hemoglobin 12.3, hematocrit 35.8, MCV 103.2, RDW 15.4, platelet count 133 Respiratory viral panel is negative Urine analysis is sterile Chest x-ray reviewed by me shows multiple right rib fractures with associated right basilar atelectasis and small right pleural effusion. CT scan of the head and chest are pending at the time of this H&P Twelve-lead EKG reviewed by me shows atrial fibrillation rate controlled.  ED Course: Patient is a 75 year old male with a history of alcohol abuse and frequent falls who presents to the emergency room via EMS after he was found on the floor by his son 2 days after no one was able to contact him over the phone. Labs showed lactic acidosis of 3.2 with a normal white count.  He was tachycardic and tachypneic. He received IV fluids in the ER with improvement in his lactic acid level. He will be admitted to the hospital for further evaluation.   Review of Systems: As per HPI otherwise all other systems reviewed and negative.    Past Medical History:  Diagnosis Date   GERD (gastroesophageal reflux disease)    Substance abuse ([place])     Past Surgical History:  Procedure Laterality Date   JOINT REPLACEMENT       reports that he quit smoking about  12 years ago. His smoking use included cigarettes. He has a 20.00 pack-year smoking history. He has never used smokeless tobacco. No history on file for alcohol use and drug use.  Allergies  Allergen Reactions   Iodine Hives   Penicillins Hives   Shellfish Allergy Hives    Family History  Problem Relation Age of Onset   Atrial fibrillation Sister       Prior to Admission medications   Not on File    Physical Exam: Vitals:   11/28/20 1332 11/28/20 1430 11/28/20 1530 11/28/20 1630  BP: (!) 150/90 (!) 143/80 (!) 150/95 138/78  Pulse: 99 99 98 95  Resp:  (!) 24 (!) 25 (!) 22  Temp:       TempSrc:      SpO2:  97% 97% 96%  Weight:      Height:         Vitals:   11/28/20 1332 11/28/20 1430 11/28/20 1530 11/28/20 1630  BP: (!) 150/90 (!) 143/80 (!) 150/95 138/78  Pulse: 99 99 98 95  Resp:  (!) 24 (!) 25 (!) 22  Temp:      TempSrc:      SpO2:  97% 97% 96%  Weight:      Height:          Constitutional: Alert and oriented x 3 . Not in any apparent distress.  Appears disheveled HEENT:      Head: Normocephalic and atraumatic.         Eyes: PERLA, EOMI, Conjunctivae are normal. Sclera is non-icteric.       Mouth/Throat: Mucous membranes are moist.       Neck: Supple with no signs of meningismus. Cardiovascular: Irregularly irregular. No murmurs, gallops, or rubs. 2+ symmetrical distal pulses are present . No JVD. No LE edema Respiratory: Respiratory effort normal .Lungs sounds clear bilaterally. No wheezes, crackles, or rhonchi.  Gastrointestinal: Soft, non tender, and non distended with positive bowel sounds.  Genitourinary: No CVA tenderness. Musculoskeletal: Nontender with normal range of motion in all extremities. No cyanosis, or erythema of extremities. Neurologic:  Face is symmetric. Moving all extremities. No gross focal neurologic deficits . Skin: Skin is warm, dry.  Bruises on his forearm Psychiatric: Mood and affect are normal    Labs on Admission: I have personally reviewed following labs and imaging studies  CBC: Recent Labs  Lab 11/28/20 1325  WBC 9.5  NEUTROABS 7.7  HGB 12.3*  HCT 35.8*  MCV 103.2*  PLT 133*   Basic Metabolic Panel: Recent Labs  Lab 11/28/20 1325  NA 137  K 4.1  CL 99  CO2 21*  GLUCOSE 154*  BUN 25*  CREATININE 1.07  CALCIUM 9.4   GFR: Estimated Creatinine Clearance: 62.5 mL/min (by C-G formula based on SCr of 1.07 mg/dL). Liver Function Tests: Recent Labs  Lab 11/28/20 1325  AST 41  ALT 41  ALKPHOS 50  BILITOT 3.0*  PROT 7.5  ALBUMIN 3.5   No results for input(s): LIPASE, AMYLASE in the last 168  hours. No results for input(s): AMMONIA in the last 168 hours. Coagulation Profile: No results for input(s): INR, PROTIME in the last 168 hours. Cardiac Enzymes: Recent Labs  Lab 11/28/20 1325  CKTOTAL 359   BNP (last 3 results) No results for input(s): PROBNP in the last 8760 hours. HbA1C: No results for input(s): HGBA1C in the last 72 hours. CBG: No results for input(s): GLUCAP in the last 168 hours. Lipid Profile: No results for input(s): CHOL,  HDL, LDLCALC, TRIG, CHOLHDL, LDLDIRECT in the last 72 hours. Thyroid Function Tests: No results for input(s): TSH, T4TOTAL, FREET4, T3FREE, THYROIDAB in the last 72 hours. Anemia Panel: No results for input(s): VITAMINB12, FOLATE, FERRITIN, TIBC, IRON, RETICCTPCT in the last 72 hours. Urine analysis:    Component Value Date/Time   COLORURINE AMBER (A) 11/28/2020 1325   APPEARANCEUR CLEAR (A) 11/28/2020 1325   LABSPEC 1.027 11/28/2020 1325   PHURINE 5.0 11/28/2020 1325   GLUCOSEU 50 (A) 11/28/2020 1325   HGBUR SMALL (A) 11/28/2020 1325   BILIRUBINUR NEGATIVE 11/28/2020 1325   KETONESUR 80 (A) 11/28/2020 1325   PROTEINUR 100 (A) 11/28/2020 1325   NITRITE NEGATIVE 11/28/2020 1325   LEUKOCYTESUR NEGATIVE 11/28/2020 1325    Radiological Exams on Admission: DG Chest 2 View  Result Date: 11/28/2020 CLINICAL DATA:  Weakness. EXAM: CHEST - 2 VIEW COMPARISON:  None. FINDINGS: Status post cardiac valve repair. No pneumothorax is noted. Small right pleural effusion is noted with associated right basilar atelectasis. Displaced right sixth, seventh and eighth rib fractures are noted. IMPRESSION: Multiple right rib fractures are noted with associated right basilar atelectasis and small right pleural effusion. Electronically Signed   By: Marijo Conception M.D.   On: 11/28/2020 14:15   CT Head Wo Contrast  Result Date: 11/28/2020 CLINICAL DATA:  Syncope.  Normal neuro exam EXAM: CT HEAD WITHOUT CONTRAST TECHNIQUE: Contiguous axial images were  obtained from the base of the skull through the vertex without intravenous contrast. COMPARISON:  None. FINDINGS: Brain: Moderate to advanced atrophy. Ventricular enlargement consistent with the degree of atrophy. 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 Hypodensity in the right parietal cortex also consistent with chronic infarct. Hypodensity throughout the cerebral white matter bilaterally compatible chronic ischemia. Negative for acute cortical infarct, hemorrhage, mass. Vascular: Negative for hyperdense vessel Skull: Negative Sinuses/Orbits: Negative Other: None IMPRESSION: Moderate to advanced atrophy.  Extensive chronic ischemic changes. No acute abnormality detected. Electronically Signed   By: Franchot Gallo M.D.   On: 11/28/2020 17:23   CT Chest Wo Contrast  Result Date: 11/28/2020 CLINICAL DATA:  chest trauma, minor. EXAM: CT CHEST WITHOUT CONTRAST TECHNIQUE: Multidetector CT imaging of the chest was performed following the standard protocol without IV contrast. COMPARISON:  None. FINDINGS: Evaluation of the lung bases is degraded by respiratory motion. Cardiovascular: Aortic atherosclerosis without aneurysmal dilation. Cardiac enlargement. Three-vessel coronary artery calcifications. Prostatic mitral valve. Mediastinum/Nodes: No pathologically enlarged mediastinal, hilar or axillary lymph nodes, noting limited sensitivity for the detection of hilar adenopathy on this noncontrast study. No discrete thyroid nodule. The trachea and esophagus are unremarkable. Lungs/Pleura: Moderate right hydropneumothorax with adjacent airspace opacities. Left basilar atelectasis. Upper Abdomen: No acute abnormality. Musculoskeletal: Nondisplaced fourth left lateral rib fracture. Displaced fifth, sixth and seventh left lateral rib fractures. Nondisplaced eighth, ninth and tenth left lateral rib fractures. Prior median sternotomy. IMPRESSION: 1. Moderate right hydropneumothorax with  adjacent airspace opacities, likely reflecting atelectasis. 2. Multiple right-sided rib fractures as described above. 3. Cardiac enlargement with three-vessel coronary artery calcifications. 4. Aortic Atherosclerosis (ICD10-I70.0). These results were called by telephone at the time of interpretation on 11/28/2020 at 5:34 pm to provider CARI TRIPLETT , who verbally acknowledged these results. Electronically Signed   By: Dahlia Bailiff M.D.   On: 11/28/2020 17:38     Assessment/Plan Principal Problem:   AMS (altered mental status) Active Problems:   Frequent falls   Unspecified atrial fibrillation (HCC)   Sepsis ([place])   Alcohol abuse  Patient is a 75 year old male with a history of alcohol abuse and GERD who presents to the ER for evaluation of frequent falls and mental status changes.    Altered mental status rule out acute stroke Patient was brought into the ER by EMS for evaluation after he was found on the ground by his son. Patient does not remember the events leading to him being on the ground and is unable to tell us how long he was on the ground for Son states that he has been confused for the last couple of days which is unusual for him and no one was able to reach him for 2 days prior to this admission which is also usual for him Twelve-lead EKG shows atrial fibrillation which appears to be new Initial CT scan of the head without contrast does not show an acute bleed Will obtain an MRI of the brain without contrast to rule out a stroke due to patient's mental status changes Obtain 2D echocardiogram to rule out LV thrombus and assess LVEF Place patient on aspirin 325 mg and high intensity statins until MRI results become available and an acute stroke is ruled out.      New onset atrial fibrillation Rate controlled Patient has a CHA2DS2-VASc score of 2 and ideally requires long-term anticoagulation as primary prophylaxis for an acute stroke Will hold off on anticoagulation  for now until an acute stroke is ruled out Place patient on aspirin Consult cardiology Obtain 2D echocardiogram to assess LVEF and rule out valvular pathology     Frequent falls with multiple right-sided rib fractures Place patient on fall precautions Incentive spirometry PT evaluation in a.m. Obtain CT scan of the chest without contrast for further evaluation    Alcohol abuse Patient admits to daily alcohol use and last drink was 2 days prior to his admission Place patient on thiamine and folic acid Place patient on lorazepam per alcohol withdrawal protocol and administer for CIWA score of 8 or greater    Presumed sepsis No obvious source of sepsis at this time patient was noted to be tachycardic, tachypneic and had lactic acidosis on admission He received sepsis IV fluid bolus in the ER with normalization of his lactic acid levels Will continue empiric antibiotic therapy with aztreonam, Flagyl and vancomycin and will de-escalate antibiotic therapy if no source of sepsis is found. Elevated lactic acid level could also be secondary to alcoholic ketoacidosis     Thrombocytopenia Related to alcohol use Monitor platelet count closely    DVT prophylaxis: SCD  Code Status: DO NOT RESUSCITATE Family Communication: Greater than 50% of time was spent discussing patient's condition and plan of care with him at the bedside and with his son Christian Clark who he will assess his healthcare power of attorney over the phone.  CODE STATUS was discussed with patient and he wishes to be a DNR. Disposition Plan: Back to previous home environment Consults called: Cardiology/PT Status:At the time of admission, it appears that the appropriate admission status for this patient is inpatient. This is judged to be reasonable and necessary to provide the required intensity of service to ensure the patient's safety given the presenting symptoms, physical exam findings, and initial radiographic and laboratory  data in the context of their comorbid conditions. Patient requires inpatient status due to high intensity of service, high risk for further deterioration and high frequency of surveillance required.     Collier Bullock MD [facility]     11/28/2020, 5:41 PM

## 2020-11-28 NOTE — ED Notes (Signed)
Provided warm blankets. Pt taken to xray. Son at bedside.

## 2020-11-28 NOTE — Progress Notes (Signed)
Discussed CT scan findings with Christian Clark who was called by the radiologist, and patient is noted to have moderate right hydropneumothorax with adjacent airspace opacities, likely reflecting atelectasis. Multiple right-sided rib fractures as described above. Cardiac enlargement with three-vessel coronary artery calcifications. Aortic Atherosclerosis. Surgery or IR will be consulted for chest tube insertion. Called and spoke to patient's son over the phone and discussed CT scan findings with him as well as the new onset A. fib and the need to rule out an acute stroke.  He verbalizes understanding and agrees to the plan.

## 2020-11-28 NOTE — ED Notes (Signed)
Informed RN bed assigned 

## 2020-11-28 NOTE — Sepsis Progress Note (Signed)
ELink tracking the code sepsis.  

## 2020-11-28 NOTE — Progress Notes (Signed)
CODE SEPSIS - PHARMACY COMMUNICATION  **Broad Spectrum Antibiotics should be administered within 1 hour of Sepsis diagnosis**  Time Code Sepsis Called/Page Received: 1411  Antibiotics Ordered:  Aztreonam 2g IV x1 Metronidazole 500 mg IV x1 Vancomycin 1750 mg IV x1 (1000 mg followed by 750 mg)  Time of 1st antibiotic administration: 1420   Raiford Noble ,PharmD Clinical Pharmacist  11/28/2020  2:14 PM

## 2020-11-28 NOTE — Progress Notes (Signed)
PHARMACY -  BRIEF ANTIBIOTIC NOTE   Pharmacy has received consult(s) for vancomycin and aztreonam from an ED provider.  The patient's profile has been reviewed for ht/wt/allergies/indication/available labs. Pt has documented allergy to PCN (hives). Pt has had cefdinir in the past (06/2020); pt already received dose of aztreonam before order could be changed to cefepime. If aztreonam ordered by admitting physician, may switch to cefepime.   One time order(s) placed for: Aztreonam 2 g  Vancomycin 1750 mg (1000 mg followed by 750 mg)  Further antibiotics/pharmacy consults should be ordered by admitting physician if indicated.                       Thank you, Robyne Peers Tyliek Timberman 11/28/2020  2:24 PM

## 2020-11-28 NOTE — ED Provider Notes (Addendum)
[facility] Emergency Department Provider Note ____________________________________________   Event Date/Time   First MD Initiated Contact with Patient 11/28/20 1305     (approximate)  I have reviewed the triage vital signs and the nursing notes.   HISTORY  Chief Complaint Weakness  HPI Christian Clark is a 75 y.o. 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 Unsure how long he was there. Son called EMS after not being able to get in touch with him for 2 days. He was found on the floor. Patient's only complaint currently is right lateral rib pain after falling into a piece of furniture 2 weeks ago.     History reviewed. No pertinent past medical history.  There are no problems to display for this patient.   Prior to Admission medications   Not on File    Allergies Iodine, Penicillins, and Shellfish allergy  History reviewed. No pertinent family history.  Social History Social History   Tobacco Use   Smoking status: Former    Packs/day: 1.00    Years: 20.00    Pack years: 20.00    Types: Cigarettes    Quit date: 2010    Years since quitting: 12.8   Smokeless tobacco: Never    Review of Systems  Constitutional: No fever/chills Eyes: No visual changes. ENT: No sore throat. Cardiovascular: Denies chest pain. Respiratory: Denies shortness of breath. Gastrointestinal: No abdominal pain.  No nausea, no vomiting.  No diarrhea.  No constipation. Genitourinary: Negative for dysuria. Musculoskeletal: Positive for right lateral rib pain. Skin: Positive for skin breakdown on buttocks. Neurological: Negative for headaches, focal weakness or numbness  ____________________________________________   PHYSICAL EXAM:  Today's Vitals   11/28/20 1332 11/28/20 1430 11/28/20 1530 11/28/20 1630  BP: (!) 150/90 (!) 143/80 (!)  150/95 138/78  Pulse: 99 99 98 95  Resp:  (!) 24 (!) 25 (!) 22  Temp:      TempSrc:      SpO2:  97% 97% 96%  Weight:      Height:      PainSc:       Body mass index is 25.11 kg/m.    Constitutional: Alert and oriented. No acute distress. Eyes: Conjunctivae are normal. Head: Atraumatic. Nose: No congestion/rhinnorhea. Mouth/Throat: Mucous membranes are moist. Oropharynx non-erythematous. Neck: No stridor.   Hematological/Lymphatic/Immunilogical: No cervical lymphadenopathy. Cardiovascular: Tachycardia. Grossly normal heart sounds.  Good peripheral circulation. Respiratory: Normal respiratory effort.  No retractions. Lungs CTAB. Gastrointestinal: Soft and nontender. No distention. No abdominal bruits. No CVA tenderness. Genitourinary:  Musculoskeletal: No lower extremity tenderness nor edema.  No joint effusions. Neurologic:  Normal speech and language. No gross focal neurologic deficits are appreciated. No gait instability. Skin: Emaciated skin and skin breakdown of buttocks and upper thighs. Psychiatric: Mood and affect are normal. Speech and behavior are normal.  ____________________________________________   LABS (all labs ordered are listed, but only abnormal results are displayed)  Labs Reviewed  COMPREHENSIVE METABOLIC PANEL - Abnormal; Notable for the following components:      Result Value   CO2 21 (*)    Glucose, Bld 154 (*)    BUN 25 (*)    Total Bilirubin 3.0 (*)    Anion gap 17 (*)    All other components within normal limits  LACTIC ACID, PLASMA - Abnormal; Notable for the following components:   Lactic Acid, Venous  3.2 (*)    All other components within normal limits  CBC WITH DIFFERENTIAL/PLATELET - Abnormal; Notable for the following components:   RBC 3.47 (*)    Hemoglobin 12.3 (*)    HCT 35.8 (*)    MCV 103.2 (*)    MCH 35.4 (*)    Platelets 133 (*)    Abs Immature Granulocytes 0.12 (*)    All other components within normal limits  URINALYSIS,  ROUTINE W REFLEX MICROSCOPIC - Abnormal; Notable for the following components:   Color, Urine AMBER (*)    APPearance CLEAR (*)    Glucose, UA 50 (*)    Hgb urine dipstick SMALL (*)    Ketones, ur 80 (*)    Protein, ur 100 (*)    All other components within normal limits  TROPONIN I (HIGH SENSITIVITY) - Abnormal; Notable for the following components:   Troponin I (High Sensitivity) 43 (*)    All other components within normal limits  TROPONIN I (HIGH SENSITIVITY) - Abnormal; Notable for the following components:   Troponin I (High Sensitivity) 36 (*)    All other components within normal limits  RESP PANEL BY RT-PCR (FLU A&B, COVID) ARPGX2  CULTURE, BLOOD (ROUTINE X 2)  CULTURE, BLOOD (ROUTINE X 2)  ETHANOL  LACTIC ACID, PLASMA  CK   ____________________________________________  EKG  ED ECG REPORT I, Draylen Lobue, FNP-BC personally viewed and interpreted this ECG. **of note** poor tracing without others available for comparison.    Date: 11/28/2020  EKG Time: 1459  Rate: 88  Rhythm: normal sinus rhythm  Axis: normal  Intervals:none  ST&T Change: none  ____________________________________________  RADIOLOGY  ED MD interpretation:    Chest x-ray showing displaced right 6,7,8 rib fractures without pneumothorax.  I, Kem Boroughs, personally viewed and evaluated these images (plain radiographs) as part of my medical decision making, as well as reviewing the written report by the radiologist.  Official radiology report(s): DG Chest 2 View  Result Date: 11/28/2020 CLINICAL DATA:  Weakness. EXAM: CHEST - 2 VIEW COMPARISON:  None. FINDINGS: Status post cardiac valve repair. No pneumothorax is noted. Small right pleural effusion is noted with associated right basilar atelectasis. Displaced right sixth, seventh and eighth rib fractures are noted. IMPRESSION: Multiple right rib fractures are noted with associated right basilar atelectasis and small right pleural effusion.  Electronically Signed   By: Lupita Raider M.D.   On: 11/28/2020 14:15    ____________________________________________   PROCEDURES  Procedure(s) performed (including Critical Care):  Procedures  ____________________________________________   INITIAL IMPRESSION / ASSESSMENT AND PLAN     75 year old male presenting to the emergency department for treatment and evaluation after son was unable to get in contact with him for 2 days.  He was found down in his home awake but unable to get up.  He does not recall falling.  He is unable to tell us how long he was down.  Clothing was saturated in urine and feces.  See HPI for further details.  Patient is noted to be tachycardic and tachypneic.  He is afebrile and hypertensive.  Sepsis is part of the differential, but he may also be just dehydrated or withdrawing from alcohol.  Will monitor and order labs.  DIFFERENTIAL DIAGNOSIS  Sepsis, dehydration, alcohol withdrawal  ED COURSE  Tachycardia has responded to fluids.  Initial lactate is 3.2.  Sepsis of unknown origin presumed.  Fluids and antibiotics ordered.  On reexamination, patient remains slightly tachypneic at 22 and blood pressure  has decreased to 138/78 with a heart rate of 95 on bedside monitor and oxygen saturation of 96% on room air. IV fluids and vancomycin infusing.   Patient and family aware of plan for admission and are agreeable.  ----------------------------------------- 5:46 PM on 11/28/2020 ----------------------------------------- Notified by radiology of hydropneumothorax on the right side. Communicated this to Dr. Joylene Igo and will consult surgery.   ----------------------------------------- 5:53 PM on 11/28/2020 ----------------------------------------- Discussed with Dr. Everlene Farrier who has viewed the CT. He recommends transfer to trauma center due to concern for need for VATS, which is not done here. Results and plan discussed with patient and family. Patient requests  to be transferred to [facility] if possible.   ----------------------------------------- 6:26 PM on 11/28/2020 ----------------------------------------- Dr. Lavonia Drafts accepts the patient for ED to ED transfer.   CRITICAL CARE Performed by: Kem Boroughs  Total critical care time: 45 minutes  Critical care time was exclusive of separately billable procedures and treating other patients.  Critical care was necessary to treat or prevent imminent or life-threatening deterioration.  Critical care was time spent personally by me on the following activities: development of treatment plan with patient and/or surrogate as well as nursing, discussions with consultants, evaluation of patient's response to treatment, examination of patient, obtaining history from patient or surrogate, ordering and performing treatments and interventions, ordering and review of laboratory studies, ordering and review of radiographic studies, pulse oximetry and re-evaluation of patient's condition.      As part of my medical decision making, I reviewed the following data within the electronic MEDICAL RECORD NUMBER History obtained from family, Labs reviewed, and A consult was requested and obtained from this/these consultant(s) Medicine  ___________________________________________   FINAL CLINICAL IMPRESSION(S) / ED DIAGNOSES  Final diagnoses:  Closed fracture of multiple ribs of right side, initial encounter  Sepsis without acute organ dysfunction, due to unspecified organism [facility])     ED Discharge Orders     None        Christian Clark was evaluated in Emergency Department on 11/28/2020 for the symptoms described in the history of present illness. He was evaluated in the context of the global COVID-19 pandemic, which necessitated consideration that the patient might be at risk for infection with the SARS-CoV-2 virus that causes COVID-19. Institutional protocols and algorithms that pertain to the evaluation of  patients at risk for COVID-19 are in a state of rapid change based on information released by regulatory bodies including the CDC and federal and state organizations. These policies and algorithms were followed during the patient's care in the ED.   Note:  This document was prepared using Dragon voice recognition software and may include unintentional dictation errors.    Chinita Pester, FNP 11/28/20 1700    Chinita Pester, FNP 11/28/20 1952    Merwyn Katos, MD 11/29/20 [phone]

## 2020-11-28 NOTE — Consult Note (Signed)
Patient ID: Christian Clark, male   DOB: 1945/07/18, 75 y.o.   MRN: [id number]  HPI Christian Clark is a 75 y.o. male seen in consultation at the request of Dr. Francine Graven.  Tells me that 2 weeks ago he had a fall and landing on a piece of furniture in the right chest.  No loss of consciousness.  He tells me that he had progressive right-sided chest pain.  2 days ago he again suffered another fall and apparently the son called him and he was found on the floor because he was unable to stand up.  He apparently had some confusion.  EMS arrived patient was disoriented apparently had some urine and feces all over him. He on arrival was tachycardic Labs show sodium 137, potassium 4.9, chloride 99, bicarb 21, glucose 154, BUN 25, creatinine 1.07, calcium 9.4, anion gap 17, alkaline phosphatase 58, albumin 3.5, AST 41, ALT 41, total protein 7.5, total CK3 59, troponin 43 >> 36, lactic acid 3.2 >> 1.6, white count 9.5, hemoglobin 12.3, hematocrit 35.8, MCV 103.2, RDW 15.4, platelet count 133 Respiratory viral panel is negative Urine analysis is sterile Chest x-ray reviewed by me shows multiple right rib fractures with associated right basilar atelectasis and small right pleural effusion. I have personally reviewed his CT scan showing evidence of multiple right-sided rib fractures with hydropneumothorax consistent with retained hemothorax.  No evidence of solid organ injury.  No evidence of tension component prior open heart surgery performed at [facility] by Dr. Arlyn Dunning 5 years ago.  HPI  Past Medical History:  Diagnosis Date   GERD (gastroesophageal reflux disease)    Substance abuse ([place])     Past Surgical History:  Procedure Laterality Date   JOINT REPLACEMENT      Family History  Problem Relation Age of Onset   Atrial fibrillation Sister     Social History Social History   Tobacco Use   Smoking status: Former    Packs/day: 1.00    Years: 20.00    Pack years: 20.00    Types:  Cigarettes    Quit date: 2010    Years since quitting: 12.8   Smokeless tobacco: Never    Allergies  Allergen Reactions   Iodine Hives   Penicillins Hives   Shellfish Allergy Hives    Current Facility-Administered Medications  Medication Dose Route Frequency Provider Last Rate Last Admin   acetaminophen (TYLENOL) tablet 650 mg  650 mg Oral Q6H PRN Agbata, Tochukwu, MD       Or   acetaminophen (TYLENOL) suppository 650 mg  650 mg Rectal Q6H PRN Agbata, Tochukwu, MD       aspirin tablet 325 mg  325 mg Oral Daily Agbata, Tochukwu, MD       atorvastatin (LIPITOR) tablet 40 mg  40 mg Oral Daily Agbata, Tochukwu, MD       ceFEPIme (MAXIPIME) 2 g in sodium chloride 0.9 % 100 mL IVPB  2 g Intravenous Q8H Rauer, Samantha O, RPH       folic acid (FOLVITE) tablet 1 mg  1 mg Oral Daily Agbata, Tochukwu, MD   1 mg at 11/28/20 1738   lactated ringers infusion   Intravenous Continuous Triplett, Cari B, FNP 150 mL/hr at 11/28/20 1558 New Bag at 11/28/20 1558   LORazepam (ATIVAN) injection 0-4 mg  0-4 mg Intravenous Q6H Triplett, Cari B, FNP       Or   LORazepam (ATIVAN) tablet 0-4 mg  0-4 mg Oral Q6H Triplett, Cari B,  FNP       [START ON 12/01/2020] LORazepam (ATIVAN) injection 0-4 mg  0-4 mg Intravenous Q12H Triplett, Cari B, FNP       Or   [START ON 12/01/2020] LORazepam (ATIVAN) tablet 0-4 mg  0-4 mg Oral Q12H Triplett, Cari B, FNP       metroNIDAZOLE (FLAGYL) IVPB 500 mg  500 mg Intravenous Q12H Agbata, Tochukwu, MD       ondansetron (ZOFRAN) tablet 4 mg  4 mg Oral Q6H PRN Agbata, Tochukwu, MD       Or   ondansetron (ZOFRAN) injection 4 mg  4 mg Intravenous Q6H PRN Agbata, Tochukwu, MD       thiamine tablet 100 mg  100 mg Oral Daily Triplett, Cari B, FNP   100 mg at 11/28/20 1738   Or   thiamine (B-1) injection 100 mg  100 mg Intravenous Daily Triplett, Cari B, FNP       [START ON 11/29/2020] vancomycin (VANCOREADY) IVPB 1750 mg/350 mL  1,750 mg Intravenous Q24H Rauer, Robyne Peers, RPH        Current Outpatient Medications  Medication Sig Dispense Refill   ELIQUIS 5 MG TABS tablet Take 5 mg by mouth 2 (two) times daily.       Review of Systems Full ROS  was asked and was negative except for the information on the HPI  Physical Exam Blood pressure 138/78, pulse 95, temperature 97.9 F (36.6 C), temperature source Oral, resp. rate (!) 22, height 5\' 10"  (1.778 m), weight 79.4 kg, SpO2 96 %. CONSTITUTIONAL: NAD. EYES: Pupils are equal, round,  Sclera are non-icteric. EARS, NOSE, MOUTH AND THROAT: He is wearing a mask, Hearing is intact to voice. LYMPH NODES:  Lymph nodes in the neck are normal. RESPIRATORY:  Lungs are decreased on the right side. There is normal respiratory effort, no use of accessory muscles. Chest: exquisitely tender to palpation right chest wall, no sub q air.  CARDIOVASCULAR: Heart is regular without murmurs, gallops, or rubs. GI: The abdomen is soft, nontender, and nondistended. There are no palpable masses. There is no hepatosplenomegaly. There are normal bowel sounds in all quadrants. GU: Rectal deferred.   MUSCULOSKELETAL: Normal muscle strength and tone. No cyanosis or edema.   SKIN: Turgor is good and there are no pathologic skin lesions or ulcers. NEUROLOGIC: Motor and sensation is grossly normal. Cranial nerves are grossly intact. PSYCH:  Oriented to person, place and time. Affect is normal.  Data Reviewed  I have personally reviewed the patient's imaging, laboratory findings and medical records.    Assessment/Plan S/P fall and recent blunt trauma on a patient that is anticoagulated on Eliquis with a prior open heart surgery performed at [facility] by [facility] .  Given the complex retained hemothorax I do think this is better managed by CT surgery and likely will require VATS.  Patient wishes to be transferred to [facility] where his CT surgeon is.  Second option for him will be to transfer him to [facility].  At this time does not need an immediate  chest tube as he is not having any tension pneumothorax or any hemodynamic instability.  Once again he is better managed center where CT capabilities are readily available. D/W ER team in detail.  [facility], MD FACS General Surgeon 11/28/2020, 6:19 PM

## 2020-11-28 NOTE — ED Notes (Signed)
--  Additional Contact: Brother Omelia Blackwater [phone]

## 2020-11-28 NOTE — Sepsis Progress Note (Signed)
Notified bedside nurse of need to draw repeat lactic acid after completing the fluid boluses.  ?

## 2020-11-28 NOTE — ED Notes (Signed)
Informed provider face to face re: lactic 3.2. See new orders.

## 2020-11-28 NOTE — ED Notes (Signed)
Ccalled [place],  [address] and [employer] for transport on list at [employer] no trucks    waiting to hear back from [facility] if possible transport

## 2020-11-28 NOTE — ED Triage Notes (Signed)
Pt arrives via [facility], EMS states that son had tried for 2 days to get in touch with pt and then called EMS.  Pt found on floor in urine and feces.  Pt states he has no memory of incident.  AOx4 at this time. Pt admits to ETOH, says last drink was 2 days ago.

## 2020-11-29 LAB — BLOOD CULTURE ID PANEL (REFLEXED) - BCID2

## 2020-12-01 LAB — CULTURE, BLOOD (ROUTINE X 2): Special Requests: ADEQUATE

## 2020-12-02 NOTE — Discharge Summary (Signed)
Patient admitted to the hospital for evaluation of altered mental status suspicious for an acute stroke, frequent falls and new onset atrial fibrillation. Transfer was recommended to a facility with CT surgery capabilities by general surgery  because patient was found to have multiple right-sided rib fractures with hydropneumothorax consistent with retained hemothorax for evaluation by CT surgery. Please see H&P done for further details.

## 2020-12-03 LAB — CULTURE, BLOOD (ROUTINE X 2)
Culture: NO GROWTH
Special Requests: ADEQUATE

## 2021-06-02 ENCOUNTER — Other Ambulatory Visit: Payer: Self-pay | Admitting: Internal Medicine

## 2021-06-02 DIAGNOSIS — F809 Developmental disorder of speech and language, unspecified: Secondary | ICD-10-CM

## 2021-06-02 DIAGNOSIS — I6981 Attention and concentration deficit following other cerebrovascular disease: Secondary | ICD-10-CM

## 2021-06-02 DIAGNOSIS — R0989 Other specified symptoms and signs involving the circulatory and respiratory systems: Secondary | ICD-10-CM

## 2021-06-03 ENCOUNTER — Observation Stay: Payer: Medicare Other

## 2021-06-03 ENCOUNTER — Encounter: Payer: Self-pay | Admitting: Internal Medicine

## 2021-06-03 ENCOUNTER — Inpatient Hospital Stay
Admission: EM | Admit: 2021-06-03 | Discharge: 2021-06-04 | DRG: 065 | Disposition: A | Discharge status: Skilled Nursing Facility | Payer: Medicare Other | Source: Skilled Nursing Facility | Attending: Internal Medicine | Admitting: Internal Medicine

## 2021-06-03 ENCOUNTER — Emergency Department: Payer: Medicare Other

## 2021-06-03 ENCOUNTER — Other Ambulatory Visit: Payer: Self-pay

## 2021-06-03 ENCOUNTER — Observation Stay (HOSPITAL_BASED_OUTPATIENT_CLINIC_OR_DEPARTMENT_OTHER)
Admit: 2021-06-03 | Discharge: 2021-06-03 | Disposition: A | Discharge status: Home / Self Care | Payer: Medicare Other | Attending: Internal Medicine | Admitting: Internal Medicine

## 2021-06-03 DIAGNOSIS — Z23 Encounter for immunization: Secondary | ICD-10-CM

## 2021-06-03 DIAGNOSIS — I639 Cerebral infarction, unspecified: Principal | ICD-10-CM | POA: Diagnosis present

## 2021-06-03 DIAGNOSIS — K922 Gastrointestinal hemorrhage, unspecified: Secondary | ICD-10-CM | POA: Diagnosis present

## 2021-06-03 DIAGNOSIS — R7309 Other abnormal glucose: Secondary | ICD-10-CM | POA: Insufficient documentation

## 2021-06-03 DIAGNOSIS — R4781 Slurred speech: Secondary | ICD-10-CM | POA: Diagnosis not present

## 2021-06-03 DIAGNOSIS — Z87891 Personal history of nicotine dependence: Secondary | ICD-10-CM

## 2021-06-03 DIAGNOSIS — R29703 NIHSS score 3: Secondary | ICD-10-CM | POA: Diagnosis not present

## 2021-06-03 DIAGNOSIS — I6389 Other cerebral infarction: Secondary | ICD-10-CM | POA: Diagnosis not present

## 2021-06-03 DIAGNOSIS — Z888 Allergy status to other drugs, medicaments and biological substances status: Secondary | ICD-10-CM

## 2021-06-03 DIAGNOSIS — Z9660 Presence of unspecified orthopedic joint implant: Secondary | ICD-10-CM | POA: Diagnosis not present

## 2021-06-03 DIAGNOSIS — Z91013 Allergy to seafood: Secondary | ICD-10-CM

## 2021-06-03 DIAGNOSIS — Z91041 Radiographic dye allergy status: Secondary | ICD-10-CM

## 2021-06-03 DIAGNOSIS — Z8249 Family history of ischemic heart disease and other diseases of the circulatory system: Secondary | ICD-10-CM

## 2021-06-03 DIAGNOSIS — Z880 Allergy status to penicillin: Secondary | ICD-10-CM

## 2021-06-03 DIAGNOSIS — R29810 Facial weakness: Secondary | ICD-10-CM | POA: Diagnosis present

## 2021-06-03 DIAGNOSIS — D500 Iron deficiency anemia secondary to blood loss (chronic): Secondary | ICD-10-CM | POA: Diagnosis present

## 2021-06-03 DIAGNOSIS — K219 Gastro-esophageal reflux disease without esophagitis: Secondary | ICD-10-CM | POA: Diagnosis present

## 2021-06-03 DIAGNOSIS — D649 Anemia, unspecified: Secondary | ICD-10-CM

## 2021-06-03 DIAGNOSIS — I482 Chronic atrial fibrillation, unspecified: Secondary | ICD-10-CM | POA: Diagnosis not present

## 2021-06-03 DIAGNOSIS — Z7901 Long term (current) use of anticoagulants: Secondary | ICD-10-CM | POA: Diagnosis not present

## 2021-06-03 LAB — DIFFERENTIAL
Abs Immature Granulocytes: 0.03 10*3/uL (ref 0.00–0.07)
Basophils Absolute: 0 10*3/uL (ref 0.0–0.1)
Basophils Relative: 1 %
Eosinophils Absolute: 0.2 10*3/uL (ref 0.0–0.5)
Eosinophils Relative: 3 %
Immature Granulocytes: 0 %
Lymphocytes Relative: 23 %
Lymphs Abs: 1.6 10*3/uL (ref 0.7–4.0)
Monocytes Absolute: 0.8 10*3/uL (ref 0.1–1.0)
Monocytes Relative: 12 %
Neutro Abs: 4.2 10*3/uL (ref 1.7–7.7)
Neutrophils Relative %: 61 %

## 2021-06-03 LAB — CBC
HCT: 24.6 % — ABNORMAL LOW (ref 39.0–52.0)
Hemoglobin: 7 g/dL — ABNORMAL LOW (ref 13.0–17.0)
MCH: 24.2 pg — ABNORMAL LOW (ref 26.0–34.0)
MCHC: 28.5 g/dL — ABNORMAL LOW (ref 30.0–36.0)
MCV: 85.1 fL (ref 80.0–100.0)
Platelets: 259 10*3/uL (ref 150–400)
RBC: 2.89 MIL/uL — ABNORMAL LOW (ref 4.22–5.81)
RDW: 14.5 % (ref 11.5–15.5)
WBC: 6.8 10*3/uL (ref 4.0–10.5)
nRBC: 0 % (ref 0.0–0.2)

## 2021-06-03 LAB — ECHOCARDIOGRAM COMPLETE
AR max vel: 0.8 cm2
AV Area VTI: 0.93 cm2
AV Area mean vel: 0.86 cm2
AV Mean grad: 7 mmHg
AV Peak grad: 14.1 mmHg
Ao pk vel: 1.88 m/s
Area-P 1/2: 1.78 cm2
Height: 70 in
MV VTI: 0.6 cm2
S' Lateral: 3.07 cm
Weight: 3559.11 oz

## 2021-06-03 LAB — COMPREHENSIVE METABOLIC PANEL
ALT: 15 U/L (ref 0–44)
AST: 20 U/L (ref 15–41)
Albumin: 4.2 g/dL (ref 3.5–5.0)
Alkaline Phosphatase: 74 U/L (ref 38–126)
Anion gap: 10 (ref 5–15)
BUN: 24 mg/dL — ABNORMAL HIGH (ref 8–23)
CO2: 25 mmol/L (ref 22–32)
Calcium: 9.2 mg/dL (ref 8.9–10.3)
Chloride: 106 mmol/L (ref 98–111)
Creatinine, Ser: 1.22 mg/dL (ref 0.61–1.24)
GFR, Estimated: >60 mL/min (ref >60)
Glucose, Bld: 129 mg/dL — ABNORMAL HIGH (ref 70–99)
Potassium: 3.7 mmol/L (ref 3.5–5.1)
Sodium: 141 mmol/L (ref 135–145)
Total Bilirubin: 0.5 mg/dL (ref 0.3–1.2)
Total Protein: 7.8 g/dL (ref 6.5–8.1)

## 2021-06-03 LAB — APTT: aPTT: 30 seconds (ref 24–36)

## 2021-06-03 LAB — PROTIME-INR
INR: 1.4 — ABNORMAL HIGH (ref 0.8–1.2)
Prothrombin Time: 16.9 seconds — ABNORMAL HIGH (ref 11.4–15.2)

## 2021-06-03 LAB — PREPARE RBC (CROSSMATCH)

## 2021-06-03 LAB — IRON AND TIBC
Iron: 28 ug/dL — ABNORMAL LOW (ref 45–182)
Saturation Ratios: 4 % — ABNORMAL LOW (ref 17.9–39.5)
TIBC: 651 ug/dL — ABNORMAL HIGH (ref 250–450)
UIBC: 623 ug/dL

## 2021-06-03 LAB — HEMOGLOBIN A1C
Hgb A1c MFr Bld: 6.1 % — ABNORMAL HIGH (ref 4.8–5.6)
Mean Plasma Glucose: 128.37 mg/dL

## 2021-06-03 LAB — CBG MONITORING, ED: Glucose-Capillary: 120 mg/dL — ABNORMAL HIGH (ref 70–99)

## 2021-06-03 LAB — ABO/RH: ABO/RH(D): O POS

## 2021-06-03 IMAGING — MR MR HEAD W/O CM
11 series · 47 of 48 positions shown · non-contrast
Comparison: Brain MRI [DATE]

CLINICAL DATA: Acute neurologic deficit

EXAM:
MRI HEAD WITHOUT CONTRAST
TECHNIQUE: Multiplanar, multiecho pulse sequences of the brain and surrounding
structures were obtained without intravenous contrast.

[Series 5: ax dwi_tracew · axial · 3.0mm · 0.65mm/px · z∈[-71,+83]mm · 4 of 48 slices shown]
[im 1/48]
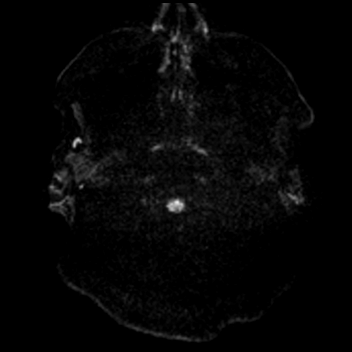
[im 16/48]
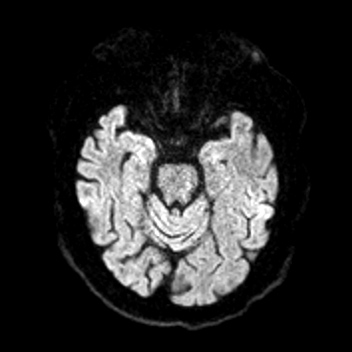
[im 32/48]
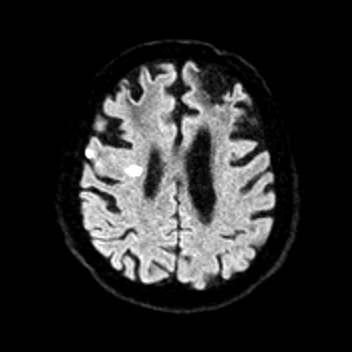
[im 48/48]
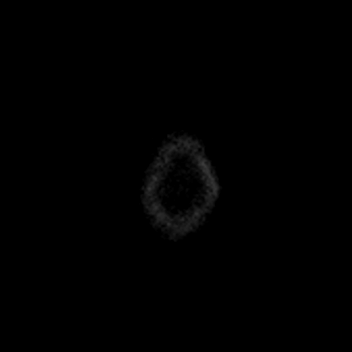

[Series 6: ax dwi_adc · axial · 3.0mm · 0.65mm/px · z∈[-71,+83]mm · 4 of 48 slices shown]
[im 1/48]
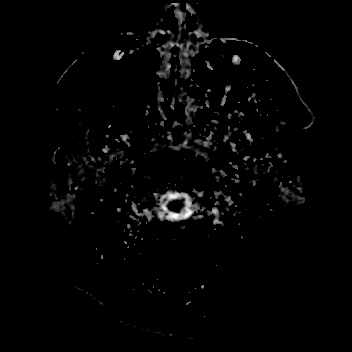
[im 16/48]
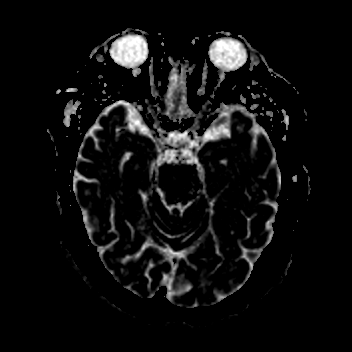
[im 32/48]
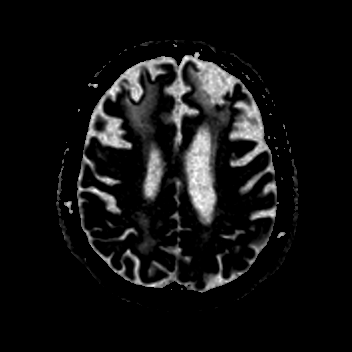
[im 48/48]
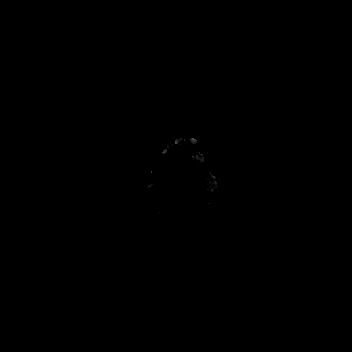

[Series 7: cor dwi_tracew · coronal · 5.0mm · 0.65mm/px · 3 of 40 slices shown]
[im 1/40]
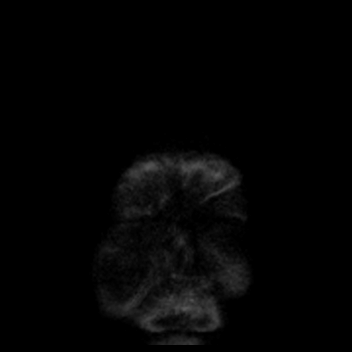
[im 20/40]
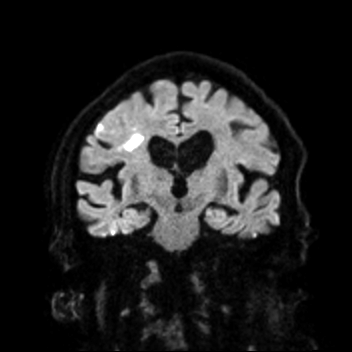
[im 40/40]
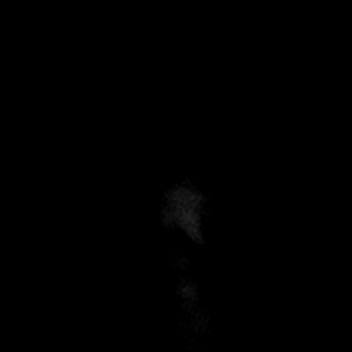

[Series 8: cor dwi_adc · coronal · 5.0mm · 0.65mm/px · 3 of 39 slices shown]
[im 1/39]
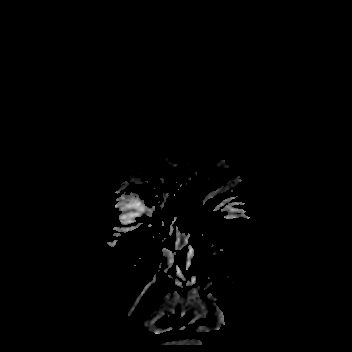
[im 20/39]
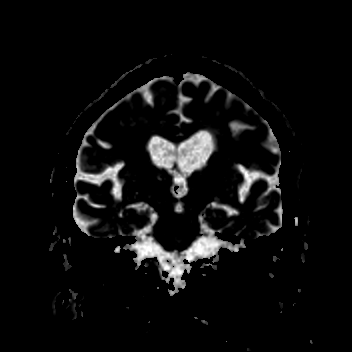
[im 39/39]
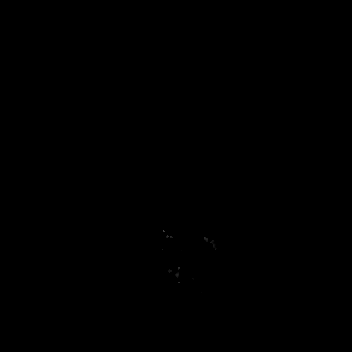

[Series 9: T1 · sagittal · 5.0mm · 0.62mm/px · 2 of 23 slices shown (1 of 2)]
[im 1/23]
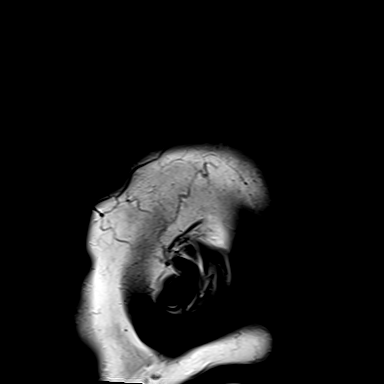
[im 23/23]
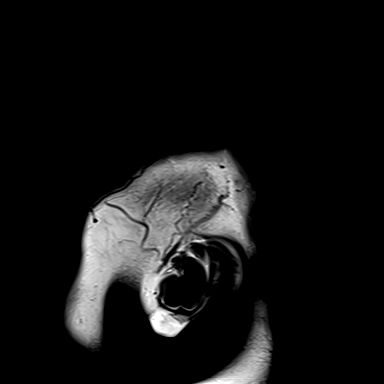

[Series 10: T2 · axial · 5.0mm · 0.53mm/px · z∈[-65,+77]mm · 2 of 25 slices shown (1 of 2)]
[im 1/25]
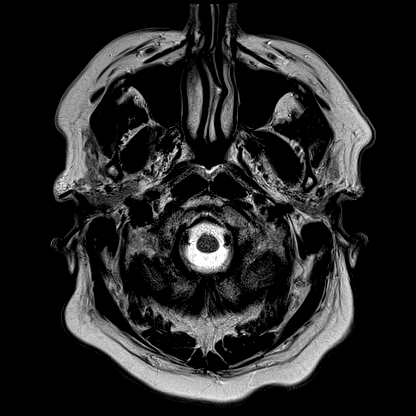
[im 25/25]
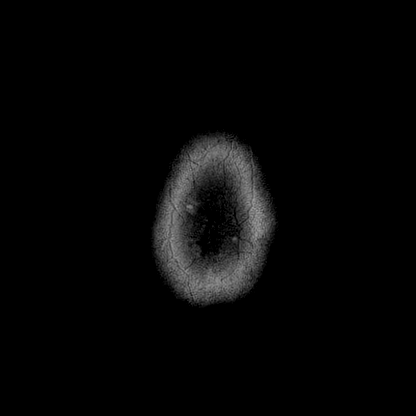

[Series 12: pha_images · axial · 3.0mm · 0.90mm/px · z∈[-83,+92]mm · 5 of 60 slices shown]
[im 1/60]
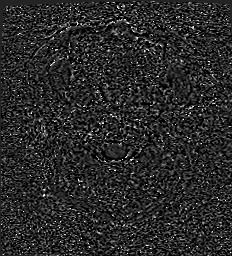
[im 15/60]
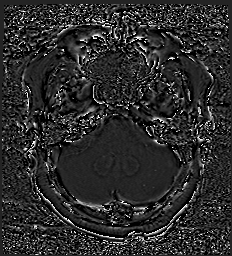
[im 30/60]
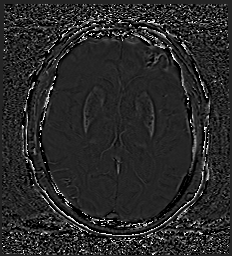
[im 45/60]
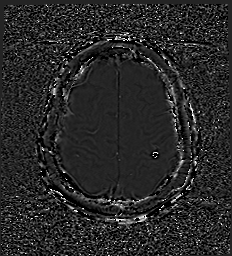
[im 60/60]
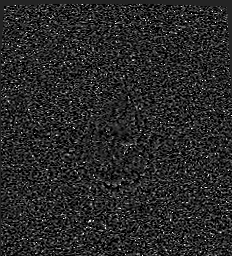

[Series 13: swi_images · axial · 3.0mm · 0.90mm/px · z∈[-83,+92]mm · 5 of 60 slices shown]
[im 1/60]
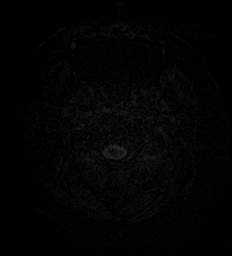
[im 15/60]
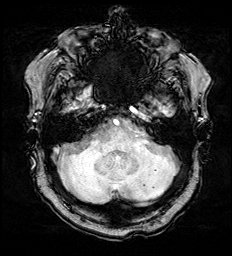
[im 30/60]
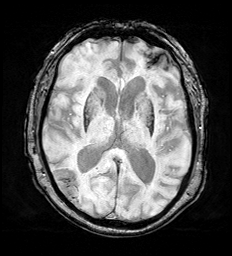
[im 45/60]
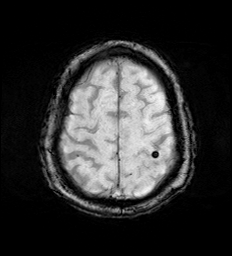
[im 60/60]
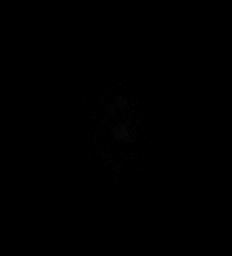

[Series 15: FLAIR · axial · 3.0mm · 0.53mm/px · z∈[-74,+86]mm · 4 of 55 slices shown]
[im 1/55]
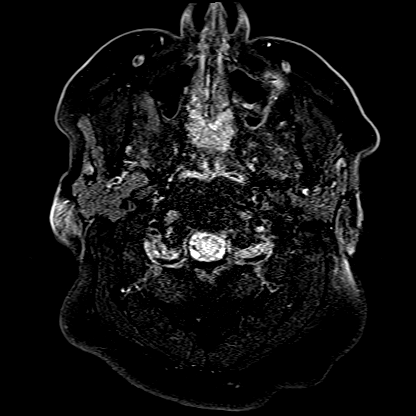
[im 19/55]
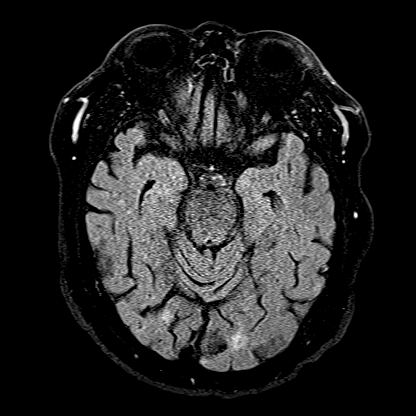
[im 37/55]
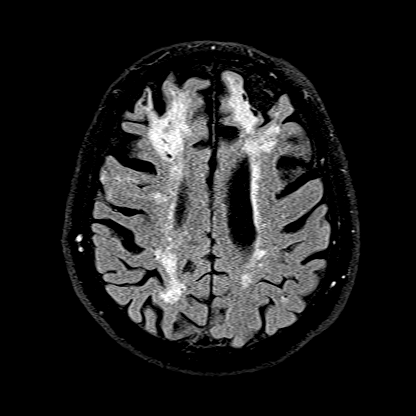
[im 55/55]
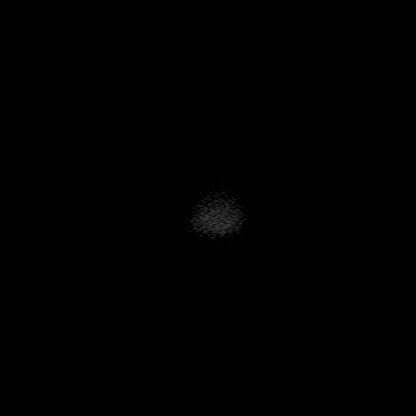

[Series 16: T1 · axial · 1.0mm · 0.98mm/px · z∈[-82,+91]mm · 13 of 175 slices shown (2 of 2)]
[im 1/175]
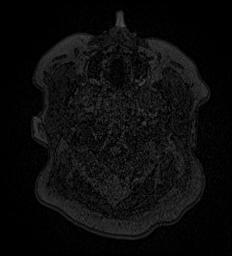
[im 14/175]
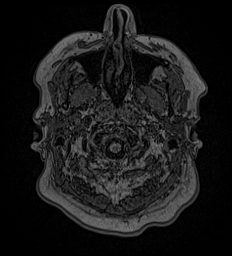
[im 27/175]
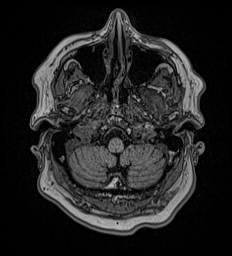
[im 41/175]
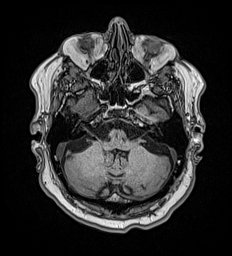
[im 54/175]
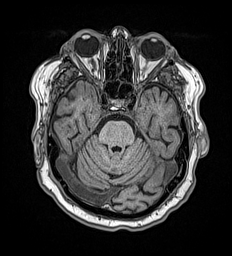
[im 67/175]
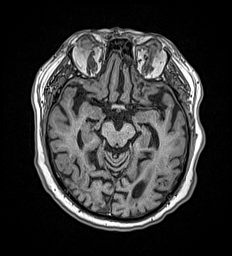
[im 81/175]
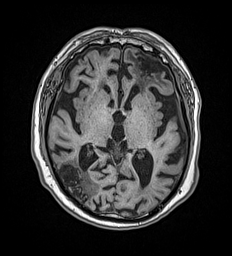
[im 94/175]
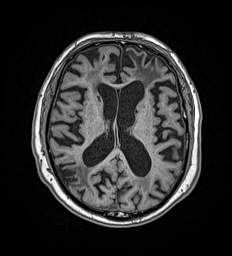
[im 108/175]
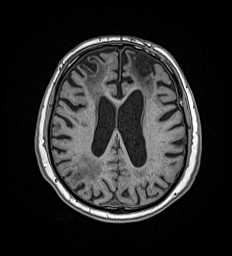
[im 121/175]
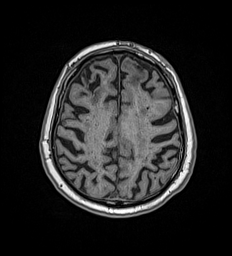
[im 134/175]
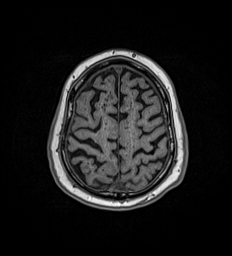
[im 148/175]
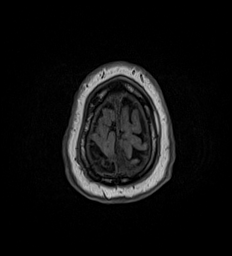
[im 175/175]
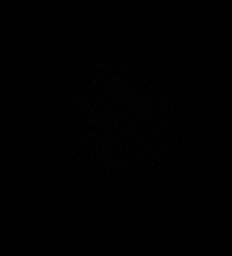

[Series 17: T2 · coronal · 5.0mm · 0.57mm/px · 2 of 29 slices shown (2 of 2)]
[im 1/29]
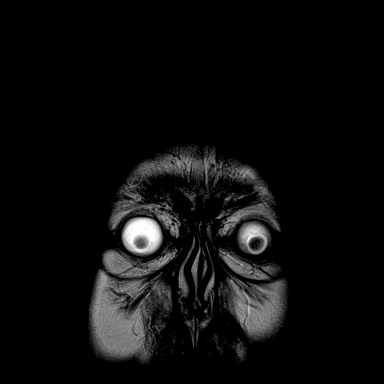
[im 29/29]
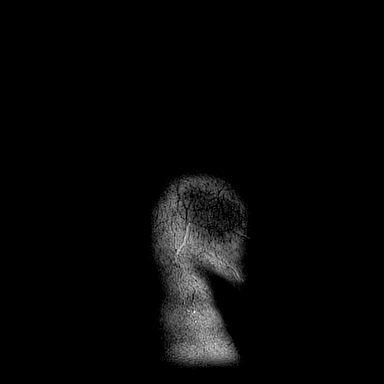

[47 of 48 positions shown; findings below may reference images not displayed]

FINDINGS: Brain: Multifocal acute ischemia within the right frontal lobe.
Multifocal chronic microhemorrhage, greatest in the left cerebellum.
There is confluent hyperintense T2-weighted signal within the white
matter. Generalized cerebral volume loss. Encephalomalacia of both
anterior frontal lobes in the posterior right parietal lobe. Old
cerebellar small vessel infarcts. The midline structures are normal.

Vascular: Major flow voids are preserved.

Skull and upper cervical spine: Normal calvarium and skull base.
Visualized upper cervical spine and soft tissues are normal.

Sinuses/Orbits:No paranasal sinus fluid levels or advanced mucosal
thickening. No mastoid or middle ear effusion. Normal orbits.
IMPRESSION: 1. Multifocal acute ischemia within the right frontal lobe. No
hemorrhage or mass effect.
2. Findings of chronic ischemic microangiopathy and multiple old
infarcts.
3. Multifocal chronic microhemorrhage.

## 2021-06-03 IMAGING — CT CT HEAD CODE STROKE
4 series · 16 of 47 positions shown, 18 images · non-contrast
Comparison: CT head [DATE].

CLINICAL DATA: Code stroke. Neuro deficit, acute, stroke suspected
droop



[Series 3: head bone · axial · 0.43mm/px · z∈[-131,-99]mm · 3 of 81 slices shown]
[im 9/81  bone]
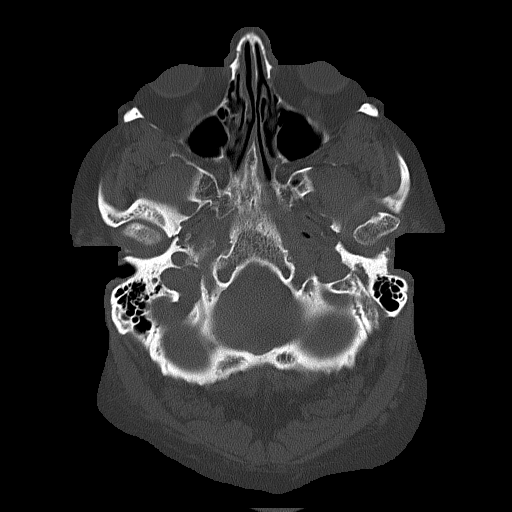
[im 17/81  bone]
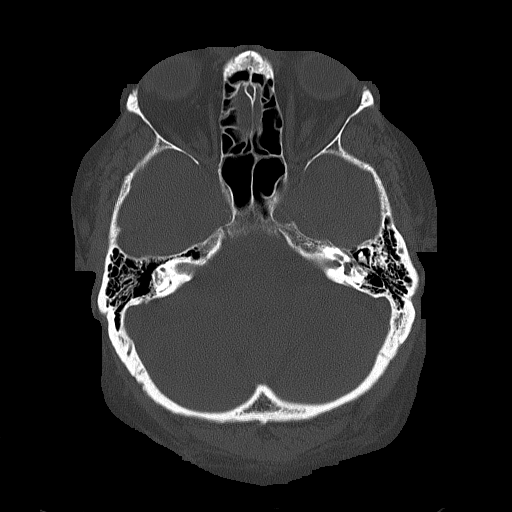
[im 25/81  bone]
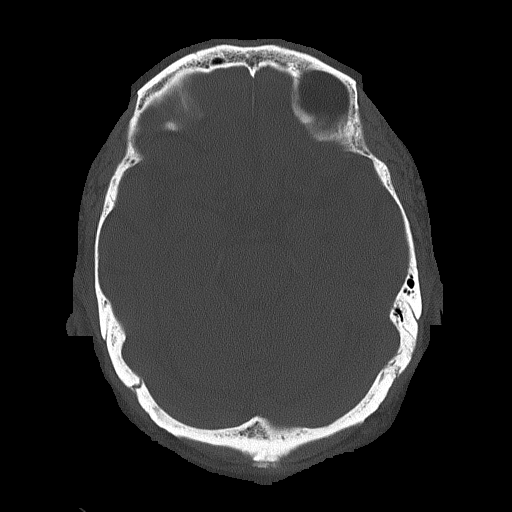

[Series 4: head wo · axial · 0.43mm/px · z∈[-127,-7]mm · 7 of 33 slices shown, 9 images]
[im 5/33  brain]
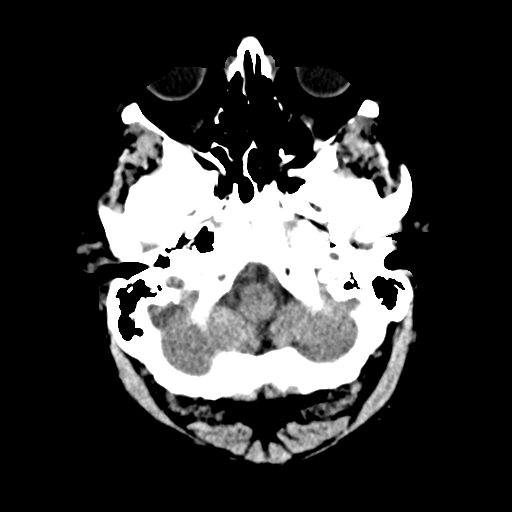
[im 5/33  bone]
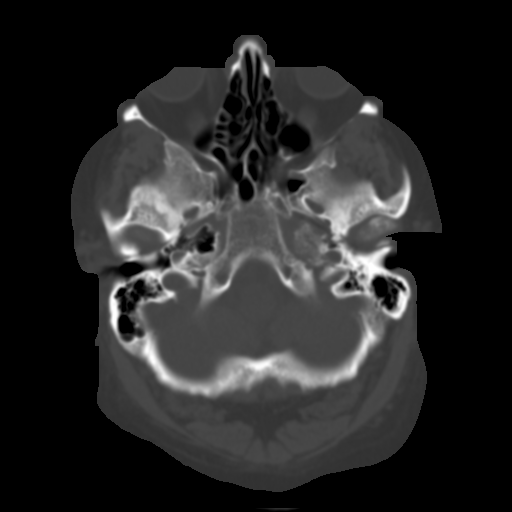
[im 9/33  brain]
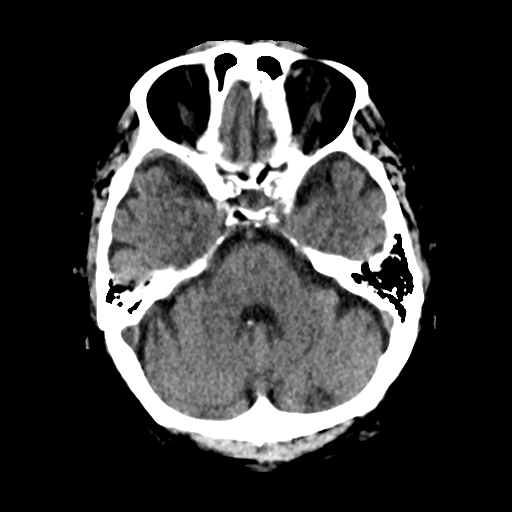
[im 13/33  brain]
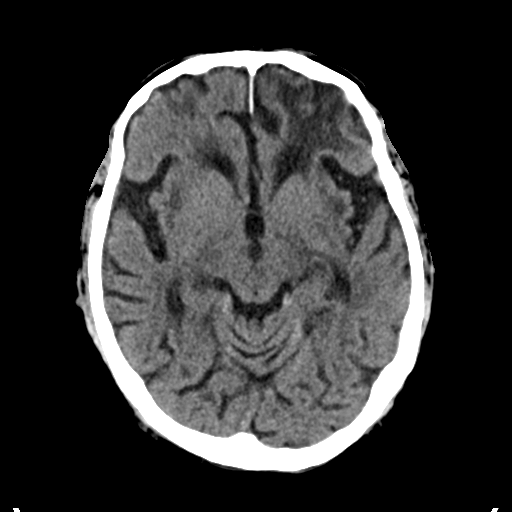
[im 17/33  brain]
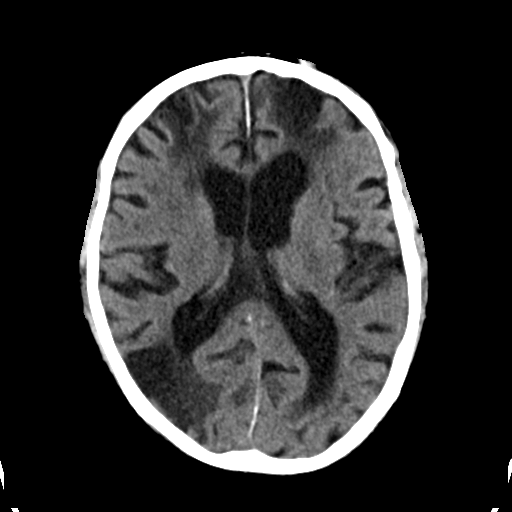
[im 21/33  brain]
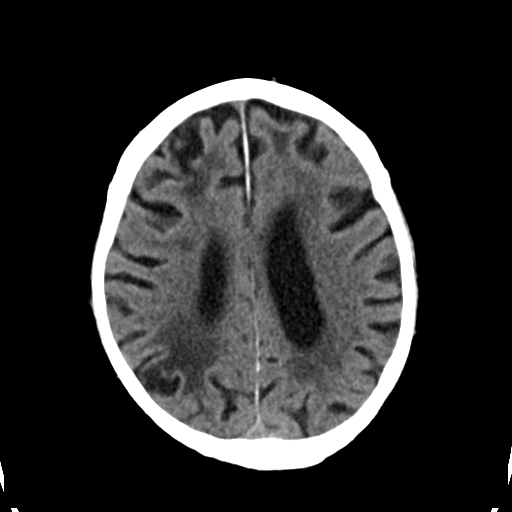
[im 21/33  bone]
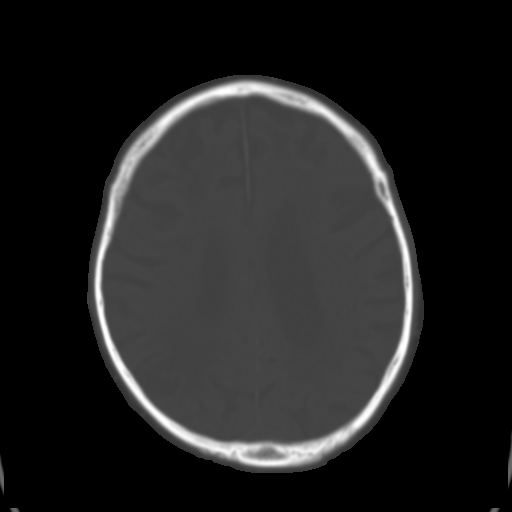
[im 25/33  brain]
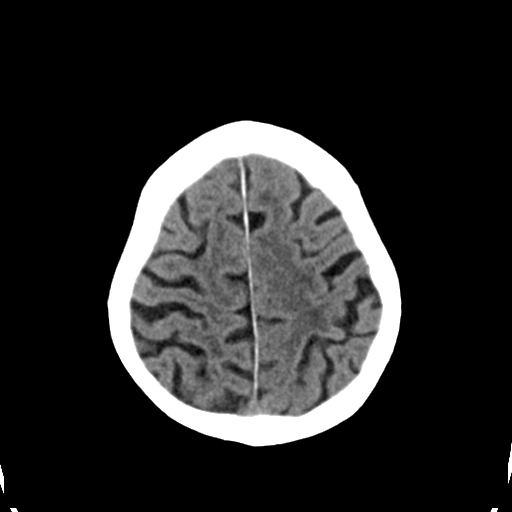
[im 29/33  brain]
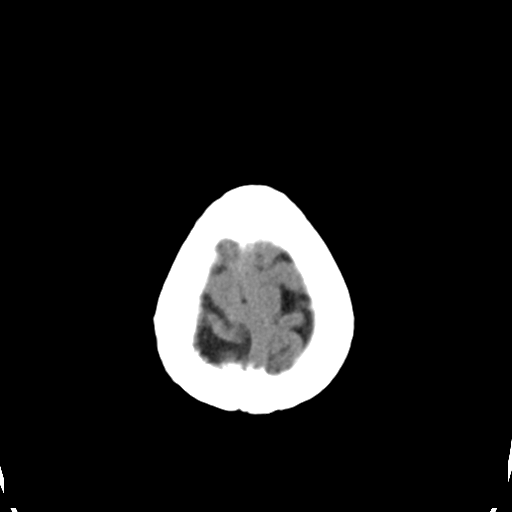

[Series 5: coronal soft tissue · coronal · 0.33mm/px · 3 of 73 slices shown]
[im 25/73  brain]
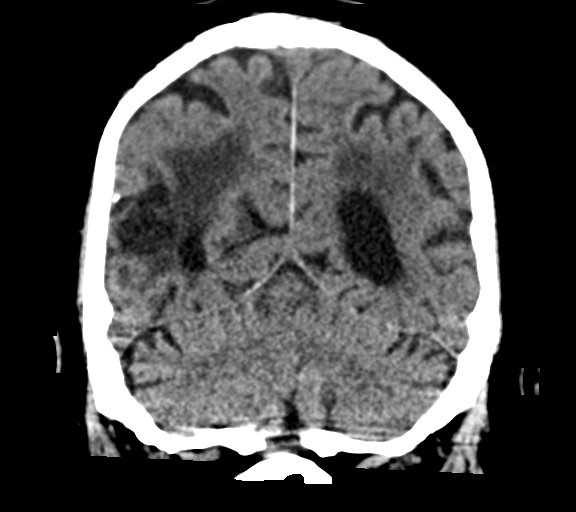
[im 33/73  brain]
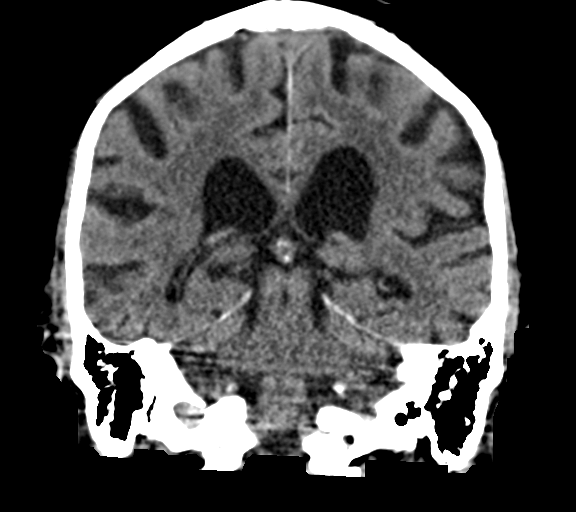
[im 41/73  brain]
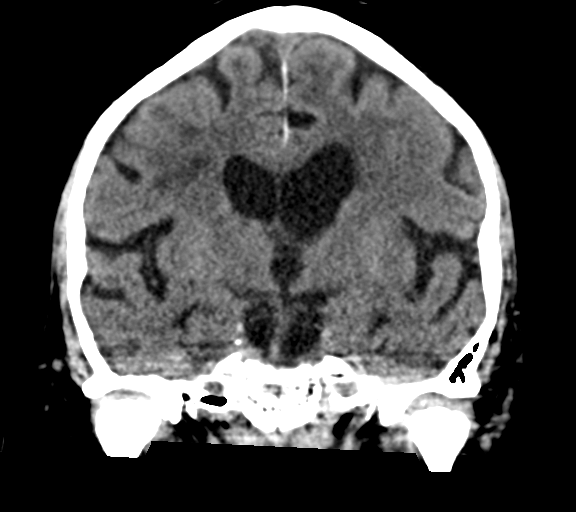

[Series 6: sagittal soft tissue · sagittal · 0.33mm/px · 3 of 63 slices shown]
[im 21/63  brain]
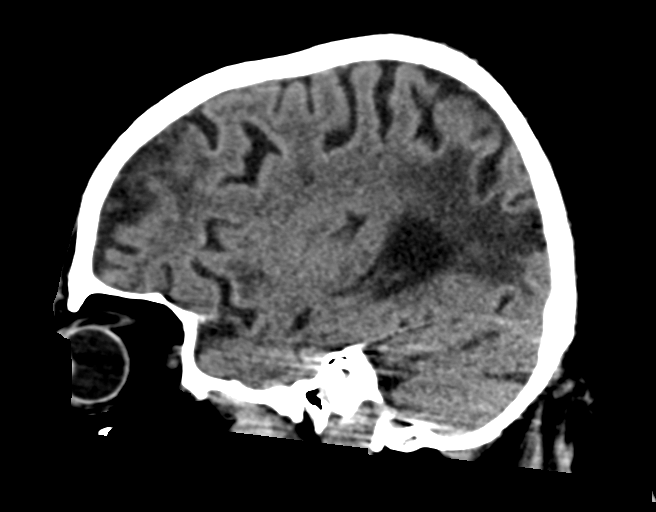
[im 32/63  brain]
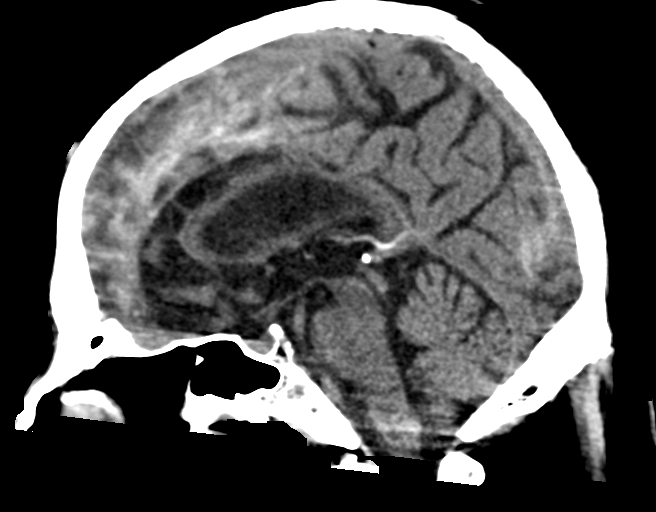
[im 42/63  brain]
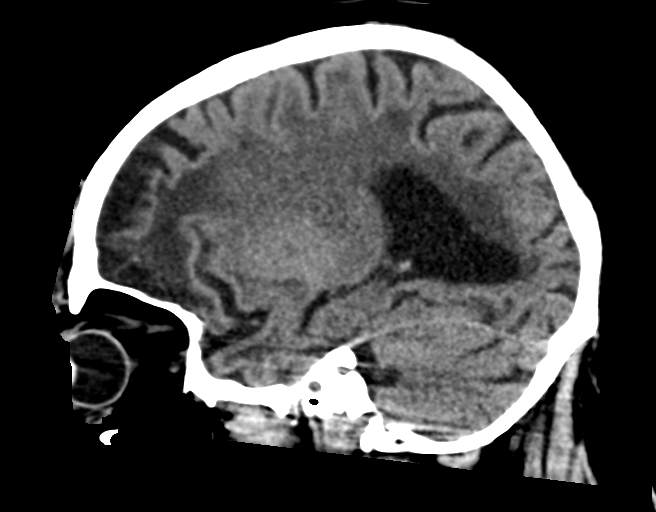

[16 of 47 positions shown; findings below may reference images not displayed]

FINDINGS: Brain: Extensive chronic ischemic change including multiple remote
infarcts and patchy white matter hypoattenuation. Cerebral atrophy.
No definite evidence of acute large vascular territory infarct,
acute hemorrhage, mass lesion, midline shift, or hydrocephalus.

Vascular: No hyperdense vessel identified. Calcific intracranial
atherosclerosis.

Skull: No acute fracture.

Sinuses/Orbits: Clear sinuses.  No acute orbital findings.

Other: No mastoid effusions.

ASPECTS ([place] Stroke Program Early CT Score) total score (0-10
with 10 being normal): 10.
IMPRESSION: Extensive chronic microvascular ischemic disease without evidence of
acute intracranial abnormality. ASPECTS 10. MRI could provide more
sensitive evaluation for acute infarct.

Code stroke imaging results were communicated on [DATE] at [DATE] to provider Dr. AUTOLUJOS via secure text paging.

## 2021-06-03 MED ORDER — MELATONIN 5 MG PO TABS
5.0000 mg | ORAL_TABLET | Freq: Every evening | ORAL | Status: DC | PRN
  Filled 2021-06-03: qty 1

## 2021-06-03 MED ORDER — STROKE: EARLY STAGES OF RECOVERY BOOK: Freq: Once | Status: AC

## 2021-06-03 MED ORDER — PANTOPRAZOLE SODIUM 40 MG IV SOLR
40.0000 mg | INTRAVENOUS | Status: DC
  Administered 2021-06-03 – 2021-06-04 (×2): 40 mg via INTRAVENOUS
  Filled 2021-06-03: qty 10

## 2021-06-03 MED ORDER — PNEUMOCOCCAL 20-VAL CONJ VACC 0.5 ML IM SUSY
0.5000 mL | PREFILLED_SYRINGE | INTRAMUSCULAR | Status: AC
  Administered 2021-06-04: 0.5 mL via INTRAMUSCULAR
  Filled 2021-06-03: qty 0.5

## 2021-06-03 MED ORDER — ACETAMINOPHEN 650 MG RE SUPP: 650.0000 mg | RECTAL | Status: DC | PRN

## 2021-06-03 MED ORDER — SODIUM CHLORIDE 0.9 % IV SOLN: 10.0000 mL/h | Freq: Once | INTRAVENOUS | Status: DC

## 2021-06-03 MED ORDER — DIPHENHYDRAMINE HCL 50 MG/ML IJ SOLN
12.5000 mg | Freq: Once | INTRAMUSCULAR | Status: AC
  Administered 2021-06-04: 12.5 mg via INTRAVENOUS
  Filled 2021-06-03: qty 1

## 2021-06-03 MED ORDER — SODIUM CHLORIDE 0.9% FLUSH
3.0000 mL | Freq: Once | INTRAVENOUS | Status: AC
  Administered 2021-06-03: 3 mL via INTRAVENOUS

## 2021-06-03 MED ORDER — ATORVASTATIN CALCIUM 20 MG PO TABS
40.0000 mg | ORAL_TABLET | Freq: Every day | ORAL | Status: DC
  Administered 2021-06-04: 40 mg via ORAL
  Filled 2021-06-03: qty 2

## 2021-06-03 MED ORDER — ACETAMINOPHEN 325 MG PO TABS: 650.0000 mg | ORAL_TABLET | ORAL | Status: DC | PRN

## 2021-06-03 MED ORDER — ACETAMINOPHEN 160 MG/5ML PO SOLN: 650.0000 mg | ORAL | Status: DC | PRN

## 2021-06-03 NOTE — Evaluation (Signed)
Clinical/Bedside Swallow Evaluation ?Patient Details  ?Name: Christian Clark ?MRN: [id number] ?Date of Birth: [birth year] ? ?Today's Date: 06/03/2021 ?Time: SLP Start Time (ACUTE ONLY): 1605 SLP Stop Time (ACUTE ONLY): 1705 ?SLP Time Calculation (min) (ACUTE ONLY): 60 min ? ?Past Medical History:  ?Past Medical History:  ?Diagnosis Date  ? GERD (gastroesophageal reflux disease)   ? Substance abuse (HCC)   ? ?Past Surgical History:  ?Past Surgical History:  ?Procedure Laterality Date  ? JOINT REPLACEMENT    ? ?HPI:  ?Pt  is a 76 y.o. male with medical history significant for atrial fibrillation on chronic anticoagulation therapy, history of GERD w/ monitoring for Barrett's Esophagus(GI) who resides in an assisted living facility and presents to the ER as a code stroke for evaluation of left facial droop and slurred speech which she has had for several days (at least 3 days).  He denies having any difficulty swallowing, no headache, no blurred vision, no focal deficits, no falls, no headache, no dizziness or lightheadedness.  He has had dark stools but denies having any frank blood or hematemesis.   ?Head CT: Extensive chronic ischemic change including multiple remote  infarcts and patchy white matter hypoattenuation. Cerebral atrophy.  No definite evidence of acute large vascular territory infarct.  MRI pending.  ?  ?Assessment / Plan / Recommendation  ?Clinical Impression ? Pt was seen for BSE this date; will f/u w/ cognitive-linguistic evaluation tomorrow(briefly discussed Dysarthria w/ pt). Pt sitting EOB talking w/ staff member in room. Pt verbal and followed all instructions appropriately. Quite pleasant and engaging. He endorsed s/s of Left facial weakness and difficulty w/ his speech ongoing for 1-2 days.  ? ?Pt appears to present w/ adequate oral phase swallow function and adequate pharyngeal phase swallow function w/ thin liquid and puree consistencies w/ no overt oropharyngeal phase dysphagia noted, and no  sensorimotor deficits w/ these consistencies when following general aspiration precautions in setting of Left oral weakness. However, when pt attempted trials of increased textured foods/solids, he exhibited throat clearing and coughing stating the consistency "tickled" the back of his throat and he could "feel it back there".  In setting of suspected CVA w/ Left oral motor weakness and Dysarthria  (MRI pending) and overt s/s of aspiration w/ textured food trials, pt appears at risk for aspiration -- however, this risk is reduced when following general aspiration precautions and eating a modified diet consistency.   ?During po trials, pt consumed thin liquid (via cup) and puree consistencies w/ no overt coughing, decline in vocal quality, or change in respiratory presentation during/post trials. Oral phase appeared [facility] w/ timely bolus management, mastication, and control of bolus propulsion for A-P transfer for swallowing. Oral clearing achieved w/ all trial consistencies. With coughing noted w/ the textured solid food trials, limited trials were given. OM Exam revealed Moderate Left unilateral labial and lingual weakness; Left lingual deviation and lateral weakness. Pt required increased focus and effort for Left lingual sweeping to clear mouth. No Left anterior leakage noted w/ trials. Mild+ Dysarthria present but speech fully intelligible. Pt fed self sitting EOB himself.   ? ?Recommend a Full Liquid diet consistency for little to no increased texture foods to initiate an oral diet this date. Thin liquids VIA CUP (for increased oral control) - pt does not use straws at home. Recommend aspiration precautions including SINGLE, SMALL SIPS w/ thin liquids; all oral intake sitting Upright/EOB. Recommend a lingual sweep and f/u Dry swallow when needed d/t increased saliva pooling orally.  No Talking during oral intake. Recommend Pills CRUSHED in Puree for safer, easier swallowing. Education given on Pills in Puree; food  consistencies currently and easy to eat options; general aspiration precautions. NSG updated. ST services will f/u tomorrow; objective assessment (MBSS) TBD.  ?SLP Visit Diagnosis: Dysphagia, pharyngeal phase (R13.13) ?   ?Aspiration Risk ? Mild aspiration risk;Risk for inadequate nutrition/hydration  ?  ?Diet Recommendation   Full Liquid diet consistency for little to no increased texture foods to initiate an oral diet this date. Thin liquids VIA CUP (for increased oral control) - pt does not use straws at home. Recommend aspiration precautions including SINGLE, SMALL SIPS w/ thin liquids; all oral intake sitting Upright/EOB. Recommend a lingual sweep and f/u Dry swallow when needed. No Talking during oral intake. ? ?Medication Administration: Crushed with puree  ?  ?Other  Recommendations Recommended Consults:  (GI following) ?Oral Care Recommendations: Oral care BID;Oral care before and after PO;Patient independent with oral care ?Other Recommendations:  (n/a)   ? ?Recommendations for follow up therapy are one component of a multi-disciplinary discharge planning process, led by the attending physician.  Recommendations may be updated based on patient status, additional functional criteria and insurance authorization. ? ?Follow up Recommendations Outpatient SLP (TBD)  ? ? ?  ?Assistance Recommended at Discharge PRN  ?Functional Status Assessment Patient has had a recent decline in their functional status and demonstrates the ability to make significant improvements in function in a reasonable and predictable amount of time.  ?Frequency and Duration min 2x/week  ?2 weeks ?  ?   ? ?Prognosis Prognosis for Safe Diet Advancement: Good ?Barriers to Reach Goals: Time post onset;Severity of deficits;Motivation  ? ?  ? ?Swallow Study   ?General Date of Onset: 06/03/21 ?HPI: Pt  is a 76 y.o. male with medical history significant for atrial fibrillation on chronic anticoagulation therapy, history of GERD w/ monitoring for  Barrett's Esophagus(GI) who resides in an assisted living facility and presents to the ER as a code stroke for evaluation of left facial droop and slurred speech which she has had for several days (at least 3 days).  He denies having any difficulty swallowing, no headache, no blurred vision, no focal deficits, no falls, no headache, no dizziness or lightheadedness.  He has had dark stools but denies having any frank blood or hematemesis.  Head CT: Extensive chronic ischemic change including multiple remote  infarcts and patchy white matter hypoattenuation. Cerebral atrophy.  No definite evidence of acute large vascular territory infarct.  MRI pending. ?Type of Study: Bedside Swallow Evaluation ?Previous Swallow Assessment: none ?Diet Prior to this Study: NPO ?Temperature Spikes Noted: No (wbc 6.8) ?Respiratory Status: Room air ?History of Recent Intubation: No ?Behavior/Cognition: Alert;Cooperative;Pleasant mood (follows all commands) ?Oral Cavity Assessment: Excessive secretions (pt endorses; able to clear w/ lingual sweep and f/u swallow) ?Oral Care Completed by SLP: Yes ?Oral Cavity - Dentition: Adequate natural dentition ?Vision: Functional for self-feeding ?Self-Feeding Abilities: Able to feed self ?Patient Positioning: Upright in bed (EOB independently) ?Baseline Vocal Quality: Normal ?Volitional Cough: Strong ?Volitional Swallow: Able to elicit  ?  ?Oral/Motor/Sensory Function Overall Oral Motor/Sensory Function: Moderate impairment ?Facial ROM: Reduced left;Suspected CN VII (facial) dysfunction ?Facial Symmetry: Abnormal symmetry left;Suspected CN VII (facial) dysfunction ?Facial Strength: Reduced left;Suspected CN VII (facial) dysfunction ?Facial Sensation:  (adequate) ?Lingual ROM: Reduced left;Suspected CN XII (hypoglossal) dysfunction ?Lingual Symmetry: Abnormal symmetry left;Suspected CN XII (hypoglossal) dysfunction ?Lingual Strength: Reduced;Suspected CN XII (hypoglossal) dysfunction ?Lingual  Sensation:  (adequate) ?Velum: Within  Functional Limits ?Mandible: Within Functional Limits   ?Ice Chips Ice chips: Within functional limits ?Presentation: Spoon (fed; 5 trials)   ?Thin Liquid Thin Liquid: W

## 2021-06-03 NOTE — ED Notes (Signed)
Paper copy of blood consent signed by patient at this time due to pin pad not working in room at this time. ?

## 2021-06-03 NOTE — ED Triage Notes (Addendum)
Pt comes with c/o right sided facial droop. Pt states nurse noticed it at 8am. Pt states he woke up at 730 and felt normal. Pt denies any weakness. Pt is on Eliquis. ? ?Code stroke called at this time to secretary ?

## 2021-06-03 NOTE — Progress Notes (Signed)
CODE STROKE- PHARMACY COMMUNICATION ? ? ?Time CODE STROKE called/page received:[id number] ? ?Time response to CODE STROKE was made (in person): 0858 ? ?Time Stroke Kit retrieved from [employer]: patient is not a TNK candidate ? ?Name of Provider/Nurse contacted: Curt Bears ? ?Past Medical History:  ?Diagnosis Date  ? GERD (gastroesophageal reflux disease)   ? Substance abuse ([place])   ? ?Prior to Admission medications   ?Medication Sig Start Date End Date Taking? Authorizing Provider  ?ELIQUIS 5 MG TABS tablet Take 5 mg by mouth 2 (two) times daily. 07/15/20   [provider]  ? ? ?Dallie Piles ,PharmD ?Clinical Pharmacist  ?06/03/2021  8:52 AM ? ? ?

## 2021-06-03 NOTE — Progress Notes (Signed)
PT Screen Note ? ?Patient Details ?Name: Christian Clark ?MRN: [id number] ?DOB: 05/02/1945 ? ? ?Cancelled Treatment:    Reason Eval/Treat Not Completed: PT screened, no needs identified, will sign off Physical Therapy order received and patient's chart reviewed. Per conversation with patient, patient reports being at/near baseline level of function, and has no further questions for therapy staff. If there is a change in status, please put in new PT orders. Signing off. ? ?Angelica Ran, PT  ?06/03/21. 1:04 PM ? ?

## 2021-06-03 NOTE — Assessment & Plan Note (Signed)
Patient presents to the ER for stroke evaluation but is noted to have a hemoglobin of 7 compared to baseline of 12. ?He has had dark stools over the last several weeks and is on Eliquis primary prophylaxis for an acute stroke ?Serum iron is low and TIBC is elevated ?We will transfuse 2 units of packed RBC ?Start patient on iron supplements ?GI consult ?

## 2021-06-03 NOTE — ED Provider Notes (Signed)
? ?[facility] ?Provider Note ? ? ? Event Date/Time  ? First MD Initiated Contact with Patient 06/03/21 [phone]   ?  (approximate) ? ?History  ? ?Chief Complaint: Code Stroke ? ?HPI ? ?Christian Clark is a 76 y.o. male with a past medical history of gastric reflux, atrial fibrillation on Eliquis, presents to the emergency department for slurred speech and left facial droop.  According to the patient since Saturday he has been experiencing some slurred speech.  Patient states he was told this morning that his right face was drooping, he states he checked in the mirror but it looks normal to him.  (On my evaluation appears to have left facial droop).  Patient came to the emergency department for further evaluation. ? ?Physical Exam  ? ?Triage Vital Signs: ?ED Triage Vitals  ?Enc Vitals Group  ?   BP 06/03/21 0849 (!) 108/56  ?   Pulse Rate 06/03/21 0849 69  ?   Resp 06/03/21 0849 18  ?   Temp 06/03/21 0849 99 ?F (37.2 ?C)  ?   Temp Source 06/03/21 0849 Oral  ?   SpO2 06/03/21 0849 94 %  ?   Weight --   ?   Height --   ?   Head Circumference --   ?   Peak Flow --   ?   Pain Score 06/03/21 0845 0  ?   Pain Loc --   ?   Pain Edu? --   ?   Excl. in [place]? --   ? ? ?Most recent vital signs: ?Vitals:  ? 06/03/21 0849 06/03/21 0902  ?BP: (!) 108/56 (!) 131/47  ?Pulse: 69 82  ?Resp: 18 18  ?Temp: 99 ?F (37.2 ?C)   ?SpO2: 94% 97%  ? ? ?General: Awake, no distress.  ?CV:  Good peripheral perfusion.  Regular rate and rhythm  ?Resp:  Normal effort.  Equal breath sounds bilaterally.  ?Abd:  No distention.  Soft, nontender.  No rebound or guarding. ?Other:  Rectal examination shows light brown stool but guaiac positive. ? ? ?ED Results / Procedures / Treatments  ? ?EKG ? ?EKG viewed and interpreted by myself shows atrial fibrillation at 68 bpm with a narrow QRS, normal axis, normal intervals, nonspecific ST changes. ? ?RADIOLOGY ? ?I personally reviewed the CT images of the head, no acute bleed seen on my  evaluation. ?CT scan read as significant microvascular disease with no acute abnormality. ? ? ?MEDICATIONS ORDERED IN ED: ?Medications  ?0.9 %  sodium chloride infusion (has no administration in time range)  ?sodium chloride flush (NS) 0.9 % injection 3 mL (3 mLs Intravenous Given 06/03/21 0909)  ? ? ? ?IMPRESSION / MDM / ASSESSMENT AND PLAN / ED COURSE  ?I reviewed the triage vital signs and the nursing notes. ? ?Patient presents emergency department for left facial droop and slurred speech ongoing since Saturday.  On my evaluation patient has a mild left facial droop does have slurred speech but otherwise intact neurological exam with no extremity weakness noted.  Patient's labs have resulted however showing significant hemoglobin drop over the last 6 months.  Rectal examination shows light brown stool but it is guaiac positive.  Patient states at times his stool has been very dark in color.  Remainder of the labs show normal white blood cell count.  Normal chemistry including reassuring renal function.  INR 1.4.  Dr. Leonel Ramsay of neurology is also seen the patient in conjunction with myself as performed  neurological examination (please see his note for NIH stroke scale).  As symptoms started 3 days ago patient is not a TNK candidate.  Patient is also on anticoagulation.  Given the patient's significant anemia I have ordered 2 units of packed red blood cells and have consented the patient for transfusion.  Patient states he has had a transfusion once before many years ago.  Patient will be admitted to the hospital service for ongoing work-up and treatment with neurologic consultation.  Patient agreeable to plan of care. ? ? ?CRITICAL CARE ?Performed by: Harvest Dark ? ? ?Total critical care time: 30 minutes ? ?Critical care time was exclusive of separately billable procedures and treating other patients. ? ?Critical care was necessary to treat or prevent imminent or life-threatening  deterioration. ? ?Critical care was time spent personally by me on the following activities: development of treatment plan with patient and/or surrogate as well as nursing, discussions with consultants, evaluation of patient's response to treatment, examination of patient, obtaining history from patient or surrogate, ordering and performing treatments and interventions, ordering and review of laboratory studies, ordering and review of radiographic studies, pulse oximetry and re-evaluation of patient's condition. ? ?FINAL CLINICAL IMPRESSION(S) / ED DIAGNOSES  ? ?CVA ?Symptomatic anemia ?GI bleed ? ?Note:  This document was prepared using Dragon voice recognition software and may include unintentional dictation errors. ?  Harvest Dark, MD ?06/03/21 [phone] ? ?

## 2021-06-03 NOTE — H&P (Signed)
?History and Physical  ? ? ?Patient: Christian Clark DOB: 03/08/1945 ?DOA: 06/03/2021 ?DOS: the patient was seen and examined on 06/03/2021 ?PCP: Kirk Ruths, MD  ?Patient coming from: Home ? ?Chief Complaint:  ?Chief Complaint  ?Patient presents with  ? Code Stroke  ? ?HPI: Christian Clark is a 76 y.o. male with medical history significant for atrial fibrillation on chronic anticoagulation therapy, history of GERD who resides in an assisted living facility and presents to the ER as a code stroke for evaluation of left facial droop and slurred speech which she has had for several days (at least 3 days).  He denies having any difficulty swallowing, no headache, no blurred vision, no focal deficits, no falls, no headache, no dizziness or lightheadedness. ?He has had dark stools but denies having any frank blood or hematemesis. He denies having any abdominal pain and denies any NSAID use.  He has exertional dyspnea but denies having any chest pain, no dizziness, no lightheadedness, no urinary symptoms, no fever, no chills, no cough. ?He was seen by neurology and is not a candidate for tPA. ? ?Review of Systems: As mentioned in the history of present illness. All other systems reviewed and are negative. ?Past Medical History:  ?Diagnosis Date  ? GERD (gastroesophageal reflux disease)   ? Substance abuse ([place])   ? ?Past Surgical History:  ?Procedure Laterality Date  ? JOINT REPLACEMENT    ? ?Social History:  reports that he quit smoking about 13 years ago. His smoking use included cigarettes. He has a 20.00 pack-year smoking history. He has never used smokeless tobacco. No history on file for alcohol use and drug use. ? ?Allergies  ?Allergen Reactions  ? Iodine Hives  ? Penicillins Hives  ? Shellfish Allergy Hives  ? ? ?Family History  ?Problem Relation Age of Onset  ? Atrial fibrillation Sister   ? ? ?Prior to Admission medications   ?Medication Sig Start Date End Date Taking? Authorizing  Provider  ?ELIQUIS 5 MG TABS tablet Take 5 mg by mouth 2 (two) times daily. 07/15/20   [provider]  ? ? ?Physical Exam: ?Vitals:  ? 06/03/21 0849 06/03/21 0902 06/03/21 0930 06/03/21 1022  ?BP: (!) 108/56 (!) 131/47 110/61   ?Pulse: 69 82 62   ?Resp: 18 18 18    ?Temp: 99 ?F (37.2 ?C)   98 ?F (36.7 ?C)  ?TempSrc: Oral   Oral  ?SpO2: 94% 97% 96%   ? ?Physical Exam ?Vitals and nursing note reviewed.  ?Constitutional:   ?   Appearance: Normal appearance.  ?   Comments: Left facial droop  ?HENT:  ?   Head: Normocephalic and atraumatic.  ?   Nose: Nose normal.  ?   Mouth/Throat:  ?   Mouth: Mucous membranes are moist.  ?Eyes:  ?   Comments: Pale conjunctiva  ?Cardiovascular:  ?   Rate and Rhythm: Rhythm irregular.  ?Pulmonary:  ?   Effort: Pulmonary effort is normal.  ?   Breath sounds: Normal breath sounds.  ?Abdominal:  ?   General: Bowel sounds are normal.  ?   Palpations: Abdomen is soft.  ?   Comments: Central adiposity  ?Musculoskeletal:     ?   General: Normal range of motion.  ?   Cervical back: Normal range of motion and neck supple.  ?Skin: ?   General: Skin is warm and dry.  ?Neurological:  ?   General: No focal deficit present.  ?   Mental  Status: He is alert and oriented to person, place, and time.  ?Psychiatric:     ?   Mood and Affect: Mood normal.     ?   Behavior: Behavior normal.  ? ? ?Data Reviewed: ?Relevant notes from primary care and specialist visits, past discharge summaries as available in EHR, including Care Everywhere. ?Prior diagnostic testing as pertinent to current admission diagnoses ?Updated medications and problem lists for reconciliation ?ED course, including vitals, labs, imaging, treatment and response to treatment ?Triage notes, nursing and pharmacy notes and ED provider's notes ?Notable results as noted in HPI ?Labs reviewed.  Serum iron 28, total iron-binding capacity 651, PT 16.9, INR 1.4, sodium 141, potassium 3.7, chloride 106, bicarb 25, glucose 129, BUN 24,  creatinine 1.22, calcium 9.2, white count 6.8, hemoglobin 7.0 compared to a baseline of 12 6 months ago, hematocrit 24.6, MCV 85.1, platelet count 259 ?CT scan of the head without contrast shows Extensive chronic microvascular ischemic disease without evidence of acute intracranial abnormality ?Twelve-lead EKG reviewed by me shows A-fib with diffuse ST changes ?There are no new results to review at this time. ? ?Assessment and Plan: ?* CVA (cerebral vascular accident) ([place]) ?Patient presents to the ER for evaluation of 3 days of slurred speech and a left facial droop ?He has a history of chronic atrial fibrillation ?Initial CT scan of the head is negative for an acute bleed ?We will obtain MRI of the brain to rule out an acute infarct ?Obtain 2D echocardiogram to assess LVEF and rule out cardiac thrombus ?Hold antiplatelets for now due to symptomatic iron deficiency anemia ?Hold Eliquis ?We will request PT/OT/ST consult ?Allow for permissive hypertension ?We will start patient on high intensity statins ? ?Atrial fibrillation, chronic ([place]) ?Patient has a history of chronic A-fib and is on Eliquis as primary prophylaxis for an acute stroke. ?Hold Eliquis for now due to symptomatic iron deficiency anemia ? ?Iron deficiency anemia due to chronic blood loss ?Patient presents to the ER for stroke evaluation but is noted to have a hemoglobin of 7 compared to baseline of 12. ?He has had dark stools over the last several weeks and is on Eliquis primary prophylaxis for an acute stroke ?Serum iron is low and TIBC is elevated ?We will transfuse 2 units of packed RBC ?Start patient on iron supplements ?GI consult ? ?GERD (gastroesophageal reflux disease) ?Stable ?Continue PPI ? ? ? ? ? Advance Care Planning:   Code Status: Full Code  ? ?Consults: Gastroenterology, Neurology ? ?Family Communication: Greater than 50% of time was spent discussing patient's condition and plan of care with him at the bedside.  All questions and  concerns have been addressed.  He verbalizes understanding and agrees with plan. ? ?Severity of Illness: ?The appropriate patient status for this patient is OBSERVATION. Observation status is judged to be reasonable and necessary in order to provide the required intensity of service to ensure the patient's safety. The patient's presenting symptoms, physical exam findings, and initial radiographic and laboratory data in the context of their medical condition is felt to place them at decreased risk for further clinical deterioration. Furthermore, it is anticipated that the patient will be medically stable for discharge from the hospital within 2 midnights of admission.  ? ?Author: ?Collier Bullock, MD ?06/03/2021 10:41 AM ? ?For on call review [email or web address].  ?

## 2021-06-03 NOTE — Consult Note (Signed)
?  ?Wyline Mood , MD ?[address], [place], [place], [address] ?[address], [place], [place], [address] ?Phone: [phone]  ?Fax: [phone] ? Consultation ? ?Referring Provider:   Dr Joylene Igo ?Primary Care Physician:  Lauro Regulus, MD ?Primary Gastroenterologist:  None          ?Reason for Consultation:     Anemia  ? ?Date of Admission:  06/03/2021 ?Date of Consultation:  06/03/2021 ?       ? HPI:   ?Christian Clark is a 76 y.o. male with a history of atrial fibrillation Eliquis presented to the emergency room with concerns for an acute stroke.  I have been consulted for possible iron deficiency anemia. ? ?Hemoglobin 7 g MCV 85 6 months back hemoglobin was 12.3 g, iron 28 TIBC elevated, creatinine 1.22. ? ?CT head showed no acute intracranial abnormality MRI has been ordered.  Rectal examination in the ER showed brown stool. ? ?He denies any overt blood loss.  He states that his stools were dark brown a few days back but not black.  He denies any blood in the urine.  Denies any hematemesis.  No change in bowel habits.  He has had some nosebleeds yesterday.  He states that he came to the hospital with a history of drooping of the left side of his face for the past 2 days.  No weakness in his limbs. ? ?He states that he has had a colonoscopy at [facility] within the past 1 year and was normal and he has an upper endoscopy every 2 years for concerns of Barrett's esophagus.  I tried accessing results through Care Everywhere and I am unable to do so ? ?Past Medical History:  ?Diagnosis Date  ? GERD (gastroesophageal reflux disease)   ? Substance abuse (HCC)   ? ? ?Past Surgical History:  ?Procedure Laterality Date  ? JOINT REPLACEMENT    ? ? ?Prior to Admission medications   ?Medication Sig Start Date End Date Taking? Authorizing Provider  ?ELIQUIS 5 MG TABS tablet Take 5 mg by mouth 2 (two) times daily. 07/15/20   [provider]  ? ? ?Family History  ?Problem Relation Age of Onset   ? Atrial fibrillation Sister   ?  ? ?Social History  ? ?Tobacco Use  ? Smoking status: Former  ?  Packs/day: 1.00  ?  Years: 20.00  ?  Pack years: 20.00  ?  Types: Cigarettes  ?  Quit date: 2010  ?  Years since quitting: 13.3  ? Smokeless tobacco: Never  ? ? ?Allergies as of 06/03/2021 - Review Complete 11/28/2020  ?Allergen Reaction Noted  ? Iodine Hives 11/28/2020  ? Penicillins Hives 11/28/2020  ? Shellfish allergy Hives 11/28/2020  ? ? ?Review of Systems:    ?All systems reviewed and negative except where noted in HPI. ? ? Physical Exam:  ?Vital signs in last 24 hours: ?Temp:  [98 ?F (36.7 ?C)-99 ?F (37.2 ?C)] 98 ?F (36.7 ?C) (05/16 1022) ?Pulse Rate:  [62-82] 62 (05/16 0930) ?Resp:  [18] 18 (05/16 0930) ?BP: (108-131)/(47-61) 110/61 (05/16 0930) ?SpO2:  [94 %-97 %] 96 % (05/16 0930) ?  ?General:   Pleasant, cooperative in NAD ?Head:  Normocephalic and atraumatic. ?Eyes:   No icterus.   Conjunctiva pink. PERRLA. ?Ears:  Normal auditory acuity. ?Neck:  Supple; no masses or thyroidomegaly ?Lungs: Respirations even and unlabored. Lungs clear to auscultation bilaterally.   No wheezes, crackles, or rhonchi.  ?Heart:  Regular  rate and rhythm;  Without murmur, clicks, rubs or gallops ?Abdomen:  Soft, nondistended, nontender. Normal bowel sounds. No appreciable masses or hepatomegaly.  No rebound or guarding.  ?Neurologic:  Alert and oriented x3; drooping of his left side of the face. ?Psych:  Alert and cooperative. Normal affect. ? ?LAB RESULTS: ?Recent Labs  ?  06/03/21 ?0852  ?WBC 6.8  ?HGB 7.0*  ?HCT 24.6*  ?PLT 259  ? ?BMET ?Recent Labs  ?  06/03/21 ?0852  ?NA 141  ?K 3.7  ?CL 106  ?CO2 25  ?GLUCOSE 129*  ?BUN 24*  ?CREATININE 1.22  ?CALCIUM 9.2  ? ?LFT ?Recent Labs  ?  06/03/21 ?0852  ?PROT 7.8  ?ALBUMIN 4.2  ?AST 20  ?ALT 15  ?ALKPHOS 74  ?BILITOT 0.5  ? ?PT/INR ?Recent Labs  ?  06/03/21 ?0852  ?LABPROT 16.9*  ?INR 1.4*  ? ? ?STUDIES: ?CT HEAD CODE STROKE WO CONTRAST ? ?Result Date: 06/03/2021 ?CLINICAL DATA:   Code stroke. Neuro deficit, acute, stroke suspected droop EXAM: CT HEAD WITHOUT CONTRAST TECHNIQUE: Contiguous axial images were obtained from the base of the skull through the vertex without intravenous contrast. RADIATION DOSE REDUCTION: This exam was performed according to the departmental dose-optimization program which includes automated exposure control, adjustment of the mA and/or kV according to patient size and/or use of iterative reconstruction technique. COMPARISON:  CT head November 28, 2020. FINDINGS: Brain: Extensive chronic ischemic change including multiple remote infarcts and patchy white matter hypoattenuation. Cerebral atrophy. No definite evidence of acute large vascular territory infarct, acute hemorrhage, mass lesion, midline shift, or hydrocephalus. Vascular: No hyperdense vessel identified. Calcific intracranial atherosclerosis. Skull: No acute fracture. Sinuses/Orbits: Clear sinuses.  No acute orbital findings. Other: No mastoid effusions. ASPECTS [facility]([place] Stroke Program Early CT Score) total score (0-10 with 10 being normal): 10. IMPRESSION: Extensive chronic microvascular ischemic disease without evidence of acute intracranial abnormality. ASPECTS 10. MRI could provide more sensitive evaluation for acute infarct. Code stroke imaging results were communicated on 06/03/2021 at 9:07 am to provider Dr. Amada JupiterKirkpatrick via secure text paging. Electronically Signed   By: Feliberto HartsFrederick S Jones M.D.   On: 06/03/2021 09:07   ? ? ? Impression / Plan:  ? ?Christian Clark is a 76 y.o. y/o male who presented to the emergency room with an acute stroke and undergoing evaluation for code stroke.  MRI of the head pending.  Patient is on Eliquis for atrial fibrillation.  I have been consulted for iron deficiency anemia.  Brown stool noted on rectal exam in the ER. ? ? ?Plan ?1.  In the setting of an acute stroke it would not be wise to perform any evaluation for a GI bleed.  Would suggest to check ferritin B12  folate urine analysis for blood loss, celiac serology.  If there is no obvious bleed seen  which would be described as hematemesis or melena then no acute evaluation will be required.  Can be done once the issue with the stroke has been resolved.  If there is evidence of hematochezia, melena, hematemesis then would recommend CT angiogram unless a stroke has been ruled out which would make it safe to perform anesthesia and its associated risks ? ?2.  The patient has iron deficiency give IV iron if has B12 deficiency replace B12 ? ?3.  There is no role for checking stool occult as it is a test for colon cancer screening and inappropriate in the setting of a code stroke.  It would not change the  management as a negative test or positive test has no bearing on the next  steps.  He has had a colonoscopy and endoscopy per his history within the past 1 to 2 years which were not significantly abnormal. ? ?4.  Try and obtain prior EGD and colonoscopy reports ? ?Thank you for involving me in the care of this patient.   ? ? ? LOS: 0 days  ? ?Wyline Mood, MD  06/03/2021, 10:39 AM ? ?   ?

## 2021-06-03 NOTE — Consult Note (Addendum)
Neurology Consultation ?Reason for Consult: Facial droop ?Referring Physician: Paduchowski, K ? ?CC: Facial droop ? ?History is obtained from: Patient ? ?HPI: Christian Clark is a 76 y.o. male who lives in an assisted living he states that he has been having trouble with "fluid pooling in my mouth" and slurred speech since Saturday.  When asked if anybody has told him his speech was slurred, he tells me "yes everybody."  Today, however, he had a different nurse who noticed that he had a left facial droop and therefore he presented to the emergency department where a code stroke was activated. ? ? ?LKW: Saturday ?tpa given?: no, outside of window ? ? ? ?Past Medical History:  ?Diagnosis Date  ? GERD (gastroesophageal reflux disease)   ? Substance abuse ([place])   ? ? ? ?Family History  ?Problem Relation Age of Onset  ? Atrial fibrillation Sister   ? ? ? ?Social History:  reports that he quit smoking about 13 years ago. His smoking use included cigarettes. He has a 20.00 pack-year smoking history. He has never used smokeless tobacco. No history on file for alcohol use and drug use. ? ? ?Exam: ?Current vital signs: ?BP (!) 108/56 (BP Location: Left Arm)   Pulse 69   Temp 99 ?F (37.2 ?C) (Oral)   Resp 18   SpO2 94%  ?Vital signs in last 24 hours: ?Temp:  [99 ?F (37.2 ?C)] 99 ?F (37.2 ?C) (05/16 0849) ?Pulse Rate:  [69] 69 (05/16 0849) ?Resp:  [18] 18 (05/16 0849) ?BP: (108)/(56) 108/56 (05/16 0849) ?SpO2:  [94 %] 94 % (05/16 0849) ? ? ?Physical Exam  ?Constitutional: Appears well-developed and well-nourished.  ?Psych: Affect appropriate to situation ?Eyes: No scleral injection ?HENT: No OP obstruction ?MSK: no joint deformities.  ?Cardiovascular: Normal rate and regular rhythm.  ?Respiratory: Effort normal, non-labored breathing ?GI: Soft.  No distension. There is no tenderness.  ?Skin: WDI ? ?Neuro: ?Mental Status: ?Patient is awake, alert, oriented to person, place, month, year, and situation. ?Patient is able  to give a clear and coherent history. ?No signs of aphasia or neglect ?Cranial Nerves: ?II: Visual Fields are full. Pupils are equal, round, and reactive to light.   ?III,IV, VI: EOMI without ptosis or diploplia.  ?V: Facial sensation is symmetric to temperature ?VII: Facial movement with left facial weakness ?X: Uvula elevates symmetrically ?XII: tongue deviates to the left.  ?Motor: ?Tone is normal. Bulk is normal. 5/5 strength was present in all four extremities.  ?Sensory: ?Sensation is symmetric to light touch and temperature in the arms and legs. ?Cerebellar: ?No ataxia on FNF ? ? ? ? ?I have reviewed labs in epic and the results pertinent to this consultation are: ?CBG 120 ? ?I have reviewed the images obtained: CT head - no acute abnormality ? 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 He will need to be admitted for secondary risk factor modification and speech evaluation. He takes eliquis, and until size is known, would hold this for now and use asa, but long term he will need to be back on eliquis monotherapy ? ?Recommendations: ?- HgbA1c, fasting lipid panel ?- MRI of the brain without contrast ?- Frequent neuro checks ?- Echocardiogram ?- MRA head and neck ?- Prophylactic therapy-Antiplatelet med: Aspirin - dose 325mg  daily, hold eliquis for now.  ?- Risk factor modification ?- Telemetry monitoring ?- PT consult, OT consult, Speech consult ? ? ? ?  Roland Rack, MD ?[facility] ?[phone] ? ?If 7pm- 7am, please page neurology on call as listed in [place]. ? ?

## 2021-06-03 NOTE — Evaluation (Signed)
Occupational Therapy Evaluation ?Patient Details ?Name: Christian Clark ?MRN: [id number] ?DOB: [birth year] ?Today's Date: 06/03/2021 ? ? ?History of Present Illness 76 y.o. male with a history of atrial fibrillation Eliquis presented to the emergency room with concerns for an acute stroke.  I have been consulted for possible iron deficiency anemia.  ? ?Clinical Impression ?  ?Upon entering the room, pt supine in bed and agreeable to OT evaluation. Pt reports currently living in [facility] but reports moving to ILF part in the next few days. He will have a cottage with level entry. Pt has SPC but does not normally use. He has difficulty donning socks and shoes at baseline and wears things he can just slip one. Pt demonstrates being independent with functional mobility, transfers, and self care tasks. Pt reports feeling at baseline other than occasional slurred speech. Pt has no new coordination, strength, or vision disturbances. Pt does not need skilled OT intervention at this time. OT to SIGN OFF.  ?   ? ?Recommendations for follow up therapy are one component of a multi-disciplinary discharge planning process, led by the attending physician.  Recommendations may be updated based on patient status, additional functional criteria and insurance authorization.  ? ?Follow Up Recommendations ? No OT follow up  ?  ?Assistance Recommended at Discharge None  ?   ?Functional Status Assessment ? Patient has not had a recent decline in their functional status  ?Equipment Recommendations ? None recommended by OT  ?  ?   ?Precautions / Restrictions Precautions ?Precautions: Fall ?Precaution Comments: moderate fall risks  ? ?  ? ?Mobility Bed Mobility ?Overal bed mobility: Independent ?  ?  ?  ?  ?  ?  ?  ?  ? ?Transfers ?Overall transfer level: Independent ?Equipment used: None ?  ?  ?  ?  ?  ?  ?  ?  ?  ? ?  ?Balance Overall balance assessment: Independent ?  ?  ?  ?  ?  ?  ?  ?  ?  ?  ?  ?  ?  ?  ?  ?  ?  ?  ?   ? ?ADL  either performed or assessed with clinical judgement  ? ?ADL Overall ADL's : Independent ?  ?  ?  ?  ?  ?  ?  ?  ?  ?  ?  ?  ?  ?  ?  ?  ?  ?  ?  ?   ? ? ? ?Vision Baseline Vision/History: 1 Wears glasses ?Ability to See in Adequate Light: 0 Adequate ?Patient Visual Report: No change from baseline ?   ?   ?   ?   ? ?Pertinent Vitals/Pain Pain Assessment ?Pain Assessment: No/denies pain  ? ? ? ?Hand Dominance Right ?  ?Extremity/Trunk Assessment Upper Extremity Assessment ?Upper Extremity Assessment: Overall WFL for tasks assessed;RUE deficits/detail ?RUE Deficits / Details: decreased shoulder flexion to 90-100 degrees but remains functional ?  ?Lower Extremity Assessment ?Lower Extremity Assessment: Overall WFL for tasks assessed ?  ?  ?  ?Communication Communication ?Communication: No difficulties ?  ?Cognition Arousal/Alertness: Awake/alert ?Behavior During Therapy: [facility] for tasks assessed/performed ?Overall Cognitive Status: Within Functional Limits for tasks assessed ?  ?  ?  ?  ?  ?  ?  ?  ?  ?  ?  ?  ?  ?  ?  ?  ?General Comments: Pt is pleasant and cooperative. Some slurred speech but able to  understand and communicate effectively this session. ?  ?  ?   ?   ?   ? ? ?Home Living Family/patient expects to be discharged to:: Assisted living ?  ?  ?  ?  ?  ?  ?  ?  ?  ?  ?  ?  ?  ?  ?Home Equipment: Kasandra Knudsen - single point ?  ?Additional Comments: Pt reports he will be moving to ILF tomorrow into a cottage. ?  ? ?  ?Prior Functioning/Environment Prior Level of Function : Independent/Modified Independent ?  ?  ?  ?  ?  ?  ?Mobility Comments: ambulation with use of SPC ?ADLs Comments: Ind all all ADLs and IADLs ?  ? ?   ?   ?   ?   ?OT Goals(Current goals can be found in the care plan section) Acute Rehab OT Goals ?Patient Stated Goal: to go home ?OT Goal Formulation: With patient ?Time For Goal Achievement: 06/03/21 ?Potential to Achieve Goals: Good  ?OT Frequency:   ?  ? ?   ?AM-PAC OT "6 Clicks" Daily Activity      ?Outcome Measure Help from another person eating meals?: None ?Help from another person taking care of personal grooming?: None ?Help from another person toileting, which includes using toliet, bedpan, or urinal?: None ?Help from another person bathing (including washing, rinsing, drying)?: None ?Help from another person to put on and taking off regular upper body clothing?: None ?Help from another person to put on and taking off regular lower body clothing?: None ?6 Click Score: 24 ?  ?End of Session Nurse Communication: Mobility status ? ?Activity Tolerance: Patient tolerated treatment well ?Patient left: in chair;with call bell/phone within reach ? ?   ?              ?Time: 1202-1217 ?OT Time Calculation (min): 15 min ?Charges:  OT General Charges ?$OT Visit: 1 Visit ?OT Evaluation ?$OT Eval Low Complexity: 1 Low ?Darleen Crocker, [place], OTR/L , CBIS ?ascom [phone]  ?06/03/21, 12:35 PM  ?

## 2021-06-03 NOTE — ED Notes (Signed)
Full rainbow sent 

## 2021-06-03 NOTE — ED Notes (Signed)
Pt taken to CT at this time.

## 2021-06-03 NOTE — Assessment & Plan Note (Signed)
Patient presents to the ER for evaluation of 3 days of slurred speech and a left facial droop ?He has a history of chronic atrial fibrillation ?Initial CT scan of the head is negative for an acute bleed ?We will obtain MRI of the brain to rule out an acute infarct ?Obtain 2D echocardiogram to assess LVEF and rule out cardiac thrombus ?Hold antiplatelets for now due to symptomatic iron deficiency anemia ?Hold Eliquis ?We will request PT/OT/ST consult ?Allow for permissive hypertension ?We will start patient on high intensity statins ?

## 2021-06-03 NOTE — Code Documentation (Addendum)
Stroke Response Nurse Documentation ?Code Documentation ? ?Christian Clark is a 76 y.o. male arriving to [facility] ED via [facility] on 5/16 with past medical hx of afib. On Eliquis (apixaban) daily. Code stroke was activated by Triage RN ? ?Patient from [facility] where he was [facility] Saturday. Pt reports he began to notice that he was slurring his speech and his saliva was collecting to the side of his mouth. This morning is when his nurse noted left sided facial droop.   ? ?Stroke team at the bedside on patient arrival. Labs drawn and patient cleared for CT by Dr. Kerman Passey. Patient to CT with team. NIHSS 3, see documentation for details and code stroke times. The following imaging was completed:  CT head. Patient is not a candidate for IV Thrombolytic due to LKW>4.5hrs.  ?Care/Plan: Pt to have stroke workup per Dr Leonel Ramsay.  ? ?Bedside handoff with ED RN Ria Comment.   ? ?Velta Addison ?Stroke Coordinator RN ?  ?

## 2021-06-03 NOTE — Assessment & Plan Note (Signed)
Patient has a history of chronic A-fib and is on Eliquis as primary prophylaxis for an acute stroke. ?Hold Eliquis for now due to symptomatic iron deficiency anemia ?

## 2021-06-03 NOTE — ED Notes (Signed)
Code  stroke  called  to  [facility] 

## 2021-06-03 NOTE — Assessment & Plan Note (Signed)
Stable.  Continue PPI. 

## 2021-06-03 NOTE — Progress Notes (Signed)
?  Chaplain On-Call responded to Code Stroke notification at 0850 hours. ? ?Chaplain visited with the patient in ED room 8. ? ?Patient described several symptoms affecting his breathing and his speech since Saturday, and how he arrived at the Emergency Room this morning after further slurred speech noticed by a Nurse in his care facility. ? ?Chaplain provided spiritual and emotional support, and assured patient of further availability if requested. ? ?Chaplain Evelena Peat ?M.Div., [facility] ?

## 2021-06-04 ENCOUNTER — Inpatient Hospital Stay: Payer: Medicare Other

## 2021-06-04 DIAGNOSIS — Z23 Encounter for immunization: Secondary | ICD-10-CM | POA: Diagnosis not present

## 2021-06-04 DIAGNOSIS — R29810 Facial weakness: Secondary | ICD-10-CM | POA: Diagnosis present

## 2021-06-04 DIAGNOSIS — I639 Cerebral infarction, unspecified: Secondary | ICD-10-CM | POA: Diagnosis not present

## 2021-06-04 LAB — HEMOGLOBIN AND HEMATOCRIT, BLOOD
HCT: 29.4 % — ABNORMAL LOW (ref 39.0–52.0)
Hemoglobin: 9 g/dL — ABNORMAL LOW (ref 13.0–17.0)

## 2021-06-04 LAB — LIPID PANEL
Cholesterol: 112 mg/dL (ref 0–200)
HDL: 36 mg/dL — ABNORMAL LOW (ref >40)
LDL Cholesterol: 61 mg/dL (ref 0–99)
Total CHOL/HDL Ratio: 3.1 RATIO
Triglycerides: 77 mg/dL (ref <150)
VLDL: 15 mg/dL (ref 0–40)

## 2021-06-04 LAB — VITAMIN B12: Vitamin B-12: 185 pg/mL (ref 180–914)

## 2021-06-04 IMAGING — MR MR MRA HEAD W/O CM
1 series · 27 of 48 positions shown · IV contrast (gadavist)
Comparison: None Available.

CLINICAL DATA: Stroke, follow-up; abnormal brain MRI

EXAM:
MRA NECK WITHOUT AND WITH CONTRAST
MRA HEAD WITHOUT CONTRAST
TECHNIQUE: Multiplanar and multiecho pulse sequences of the neck were obtained
without and with intravenous contrast. Angiographic images of the
neck were obtained using MRA technique without and with intravenous
contrast; Angiographic images of the Circle of Willis were obtained
using MRA technique without intravenous contrast.
CONTRAST:  10mL GADAVIST GADOBUTROL 1 MMOL/ML IV SOLN

[Series 5: TOF · axial · 0.5mm · 0.48mm/px · z∈[-72,+23]mm · 27 of 217 slices shown]
[im 1/217]
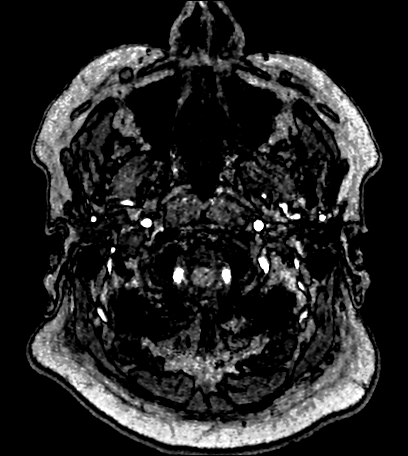
[im 5/217]
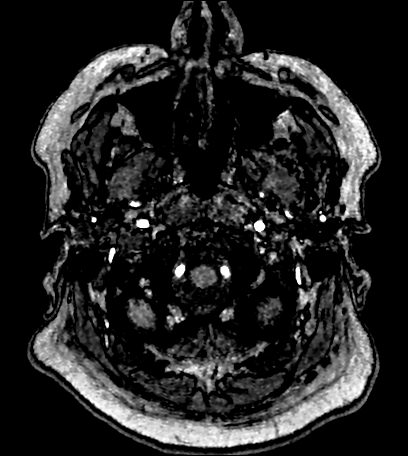
[im 10/217]
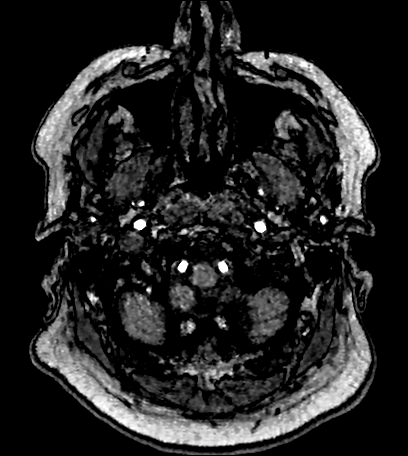
[im 14/217]
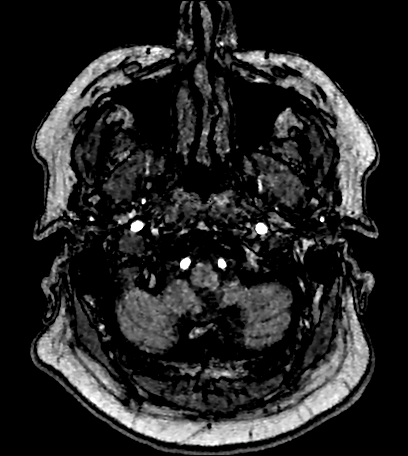
[im 19/217]
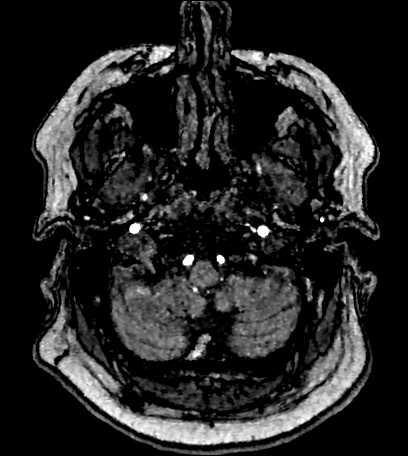
[im 23/217]
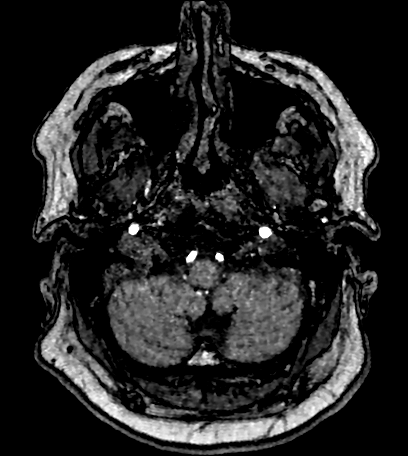
[im 28/217]
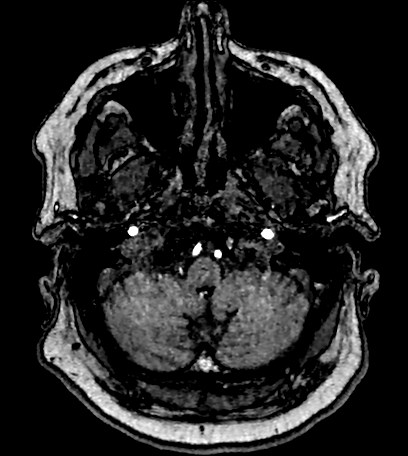
[im 33/217]
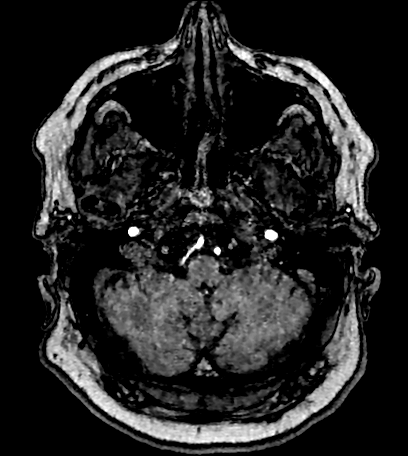
[im 37/217]
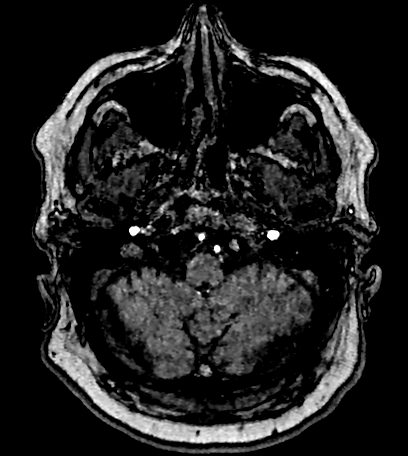
[im 42/217]
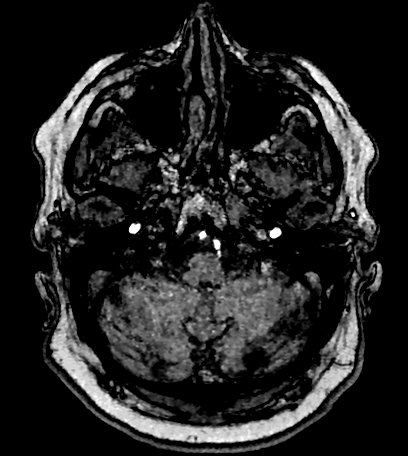
[im 46/217]
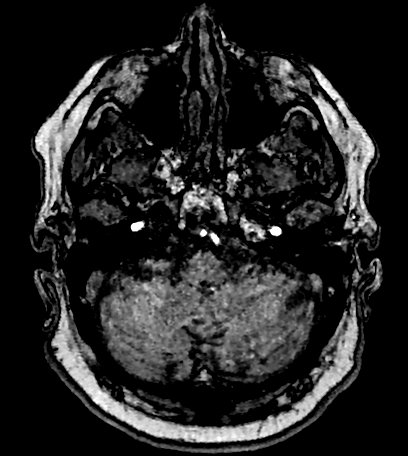
[im 51/217]
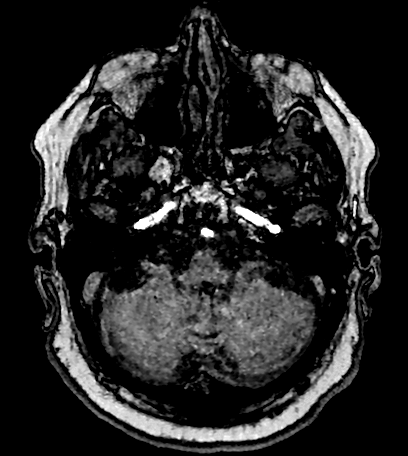
[im 56/217]
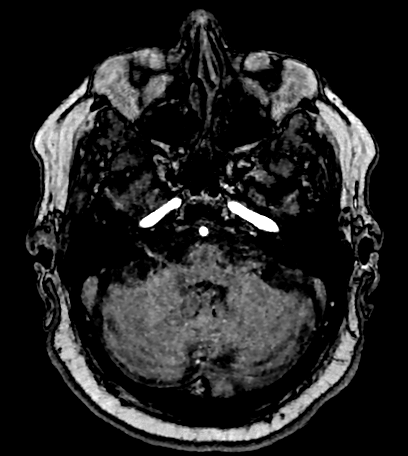
[im 60/217]
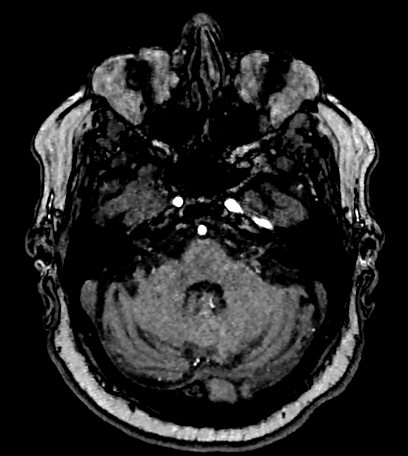
[im 65/217]
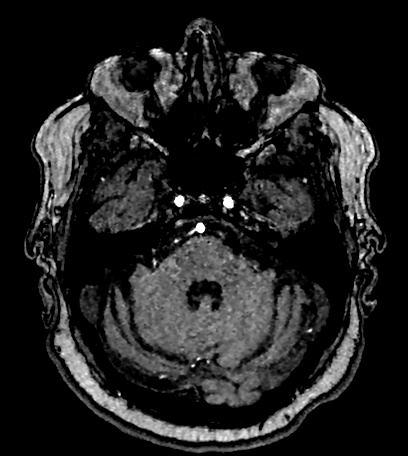
[im 69/217]
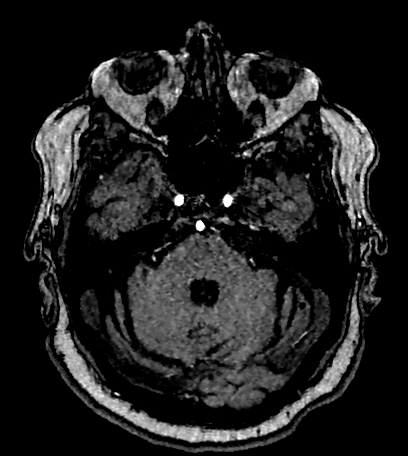
[im 74/217]
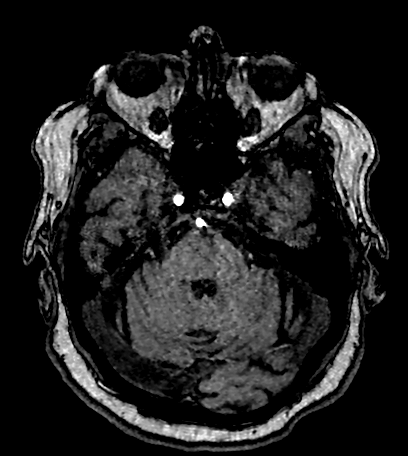
[im 79/217]
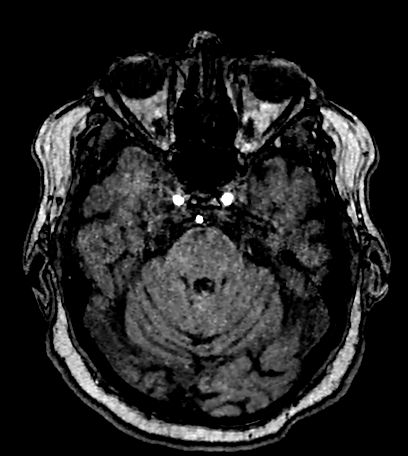
[im 83/217]
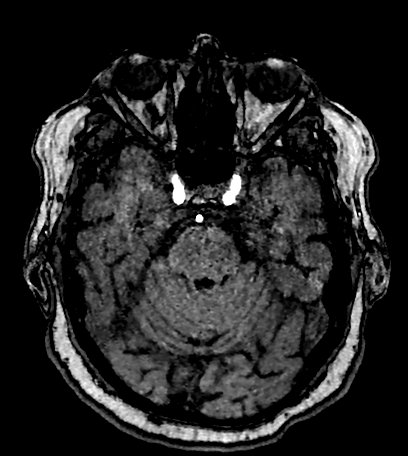
[im 88/217]
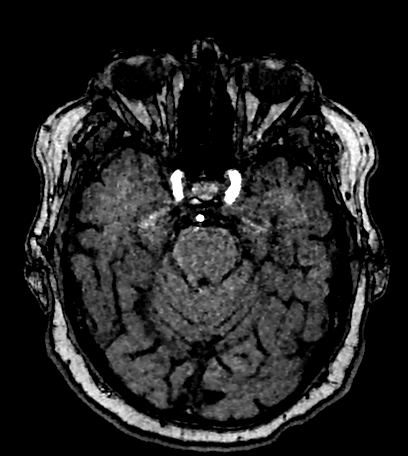
[im 97/217]
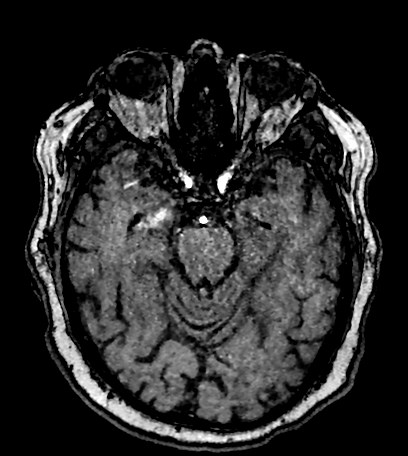
[im 111/217]
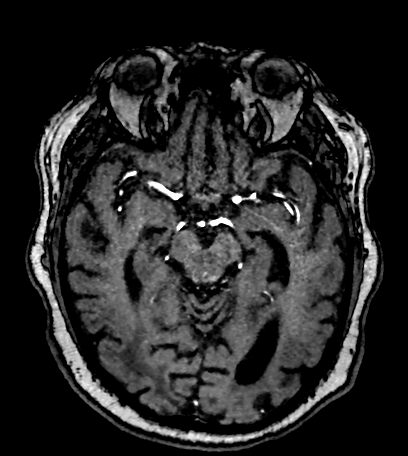
[im 125/217]
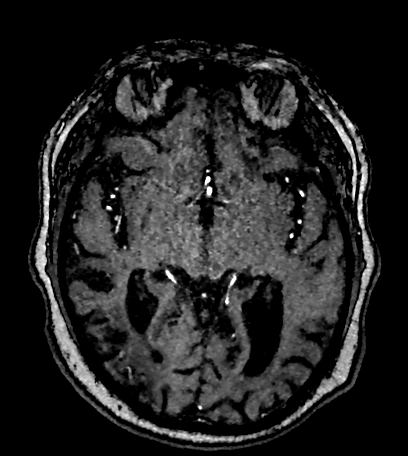
[im 152/217]
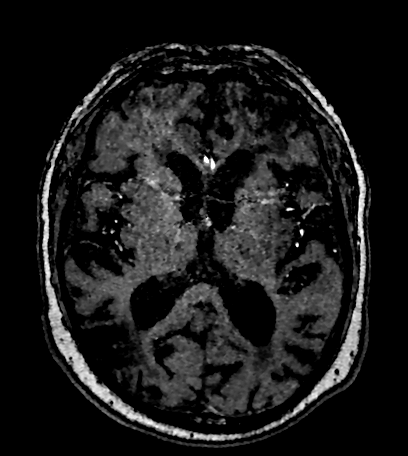
[im 180/217]
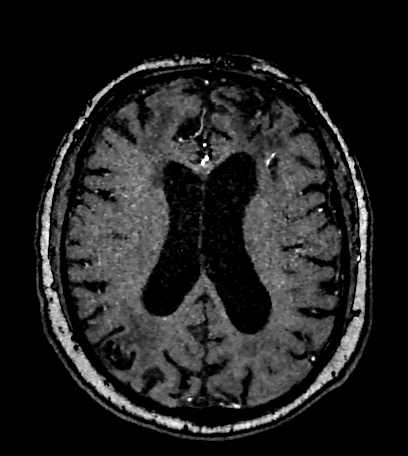
[im 184/217]
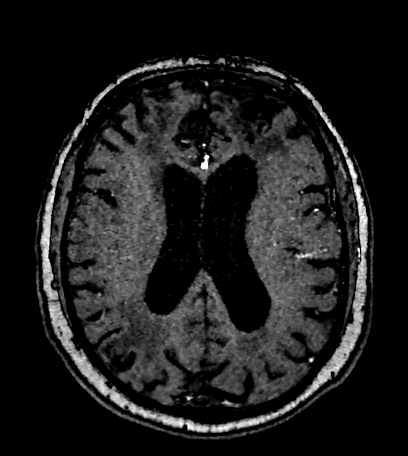
[im 207/217]
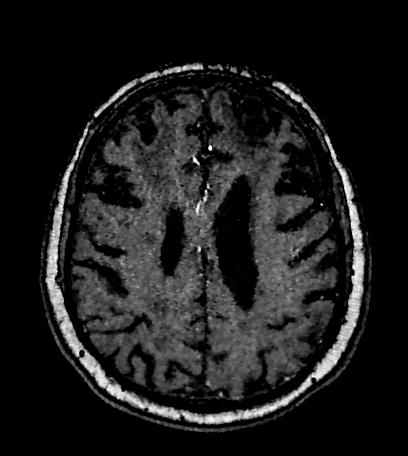

[27 of 48 positions shown; findings below may reference images not displayed]

FINDINGS: MRA NECK FINDINGS

Included aortic arch is unremarkable. Great vessel origins are
patent. Common, internal, and external carotid arteries are patent.
Extracranial vertebral arteries are patent and codominant. The
vertebral artery origins are not well evaluated due to artifact. No
hemodynamically significant stenosis or evidence of dissection.

MRA HEAD FINDINGS

Intracranial internal carotid arteries are patent. Anterior and
middle cerebral arteries are patent.

Intracranial vertebral arteries are patent. Basilar artery is
patent. Major cerebellar artery origins are patent. Posterior
cerebral arteries are patent. A small left posterior communicating
artery is identified.

No significant stenosis or aneurysm.
IMPRESSION: No large vessel occlusion or hemodynamically significant stenosis.

## 2021-06-04 IMAGING — MR MR MRA NECK WO/W CM
4 of 5 series · 36 of 48 positions shown · IV contrast (10ml Gadavist)
Comparison: None Available.

CLINICAL DATA: Stroke, follow-up; abnormal brain MRI

EXAM:
MRA NECK WITHOUT AND WITH CONTRAST
MRA HEAD WITHOUT CONTRAST
TECHNIQUE: Multiplanar and multiecho pulse sequences of the neck were obtained
without and with intravenous contrast. Angiographic images of the
neck were obtained using MRA technique without and with intravenous
contrast; Angiographic images of the Circle of Willis were obtained
using MRA technique without intravenous contrast.
CONTRAST:  10mL GADAVIST GADOBUTROL 1 MMOL/ML IV SOLN

[Series 18: angio_fl3d_cor_pre_ttc=2.0s · coronal · 0.9mm · 0.85mm/px · 9 of 96 slices shown]
[im 1/96]
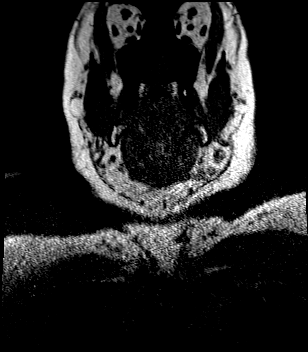
[im 12/96]
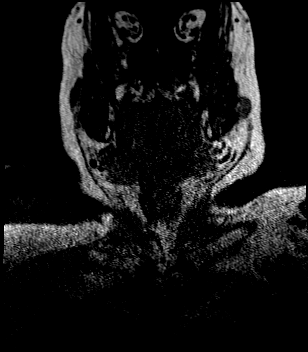
[im 24/96]
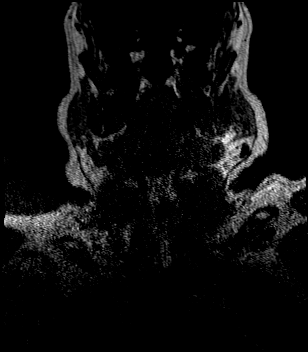
[im 36/96]
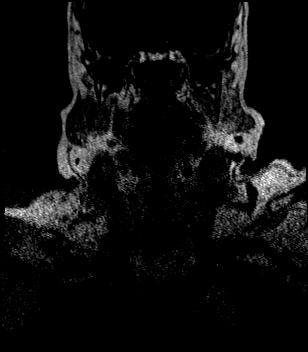
[im 48/96]
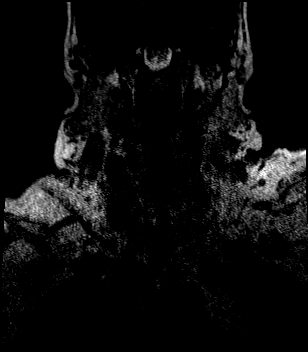
[im 60/96]
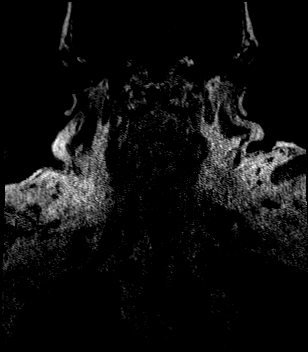
[im 72/96]
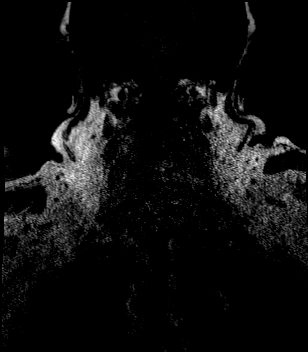
[im 84/96]
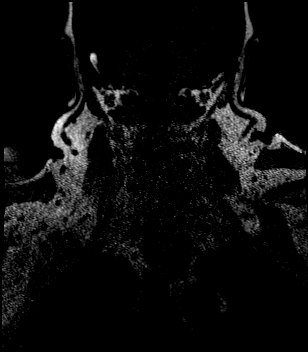
[im 96/96]
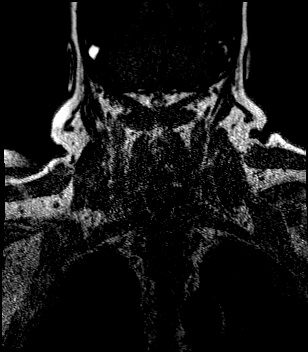

[Series 20: angio_fl3d_cor_post_ttc=2.0s · coronal · 0.9mm · 0.85mm/px · 9 of 96 slices shown]
[im 1/96]
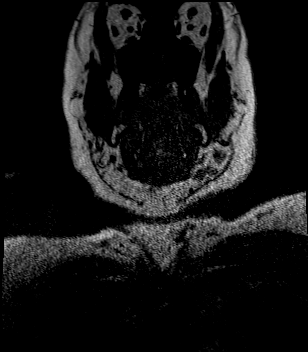
[im 12/96]
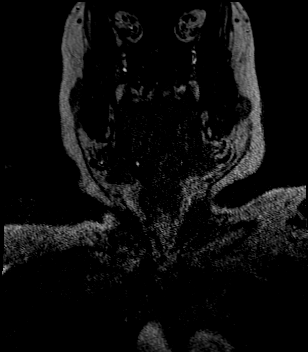
[im 24/96]
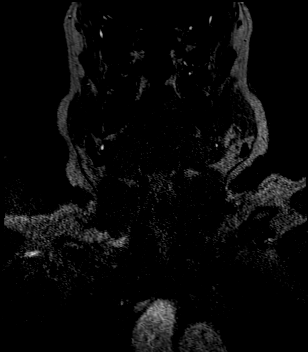
[im 36/96]
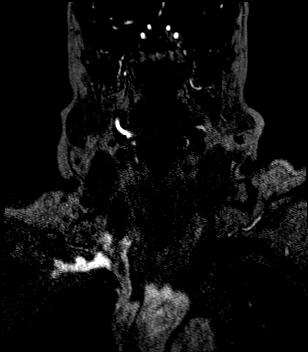
[im 48/96]
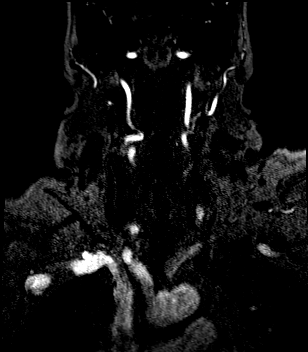
[im 60/96]
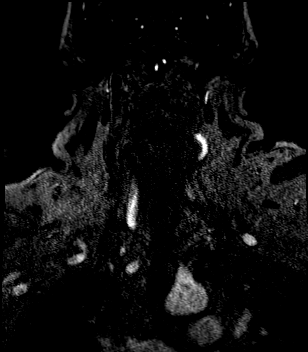
[im 72/96]
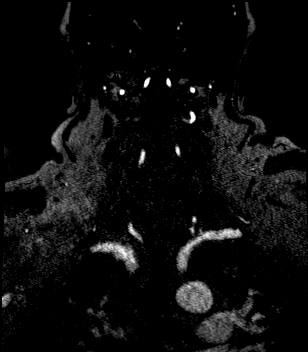
[im 84/96]
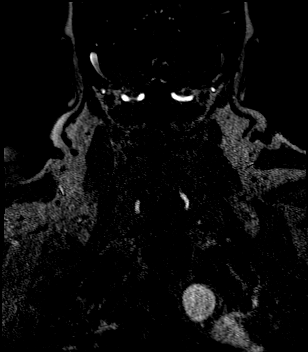
[im 96/96]
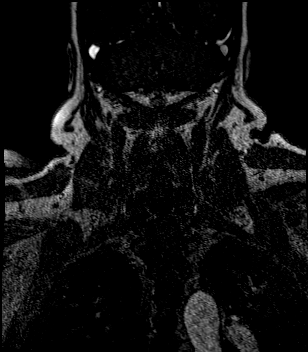

[Series 21: angio_fl3d_cor_post_ttc=2.0s_moco-adv · coronal · 0.9mm · 0.85mm/px · 9 of 96 slices shown]
[im 1/96]
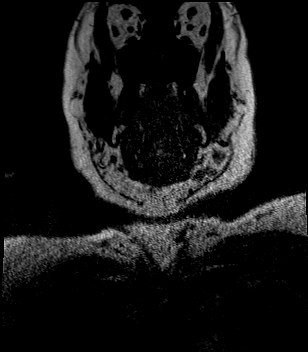
[im 12/96]
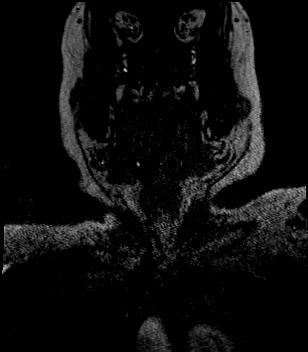
[im 24/96]
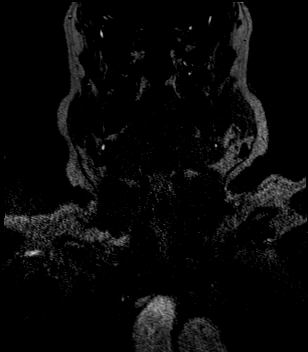
[im 36/96]
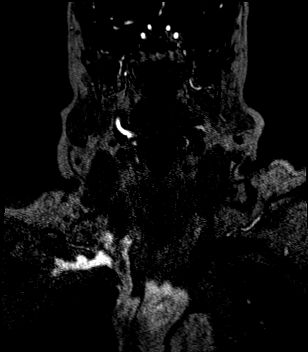
[im 48/96]
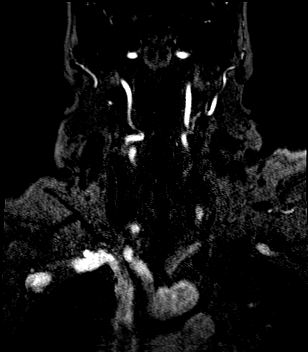
[im 60/96]
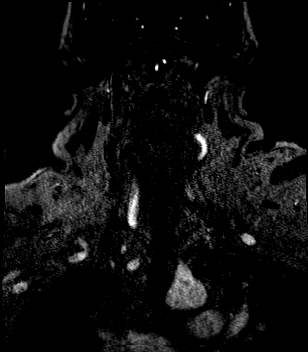
[im 72/96]
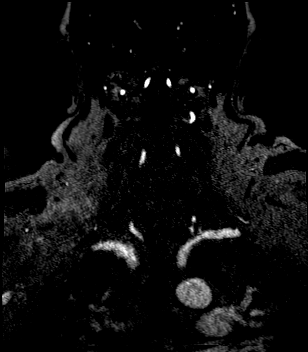
[im 84/96]
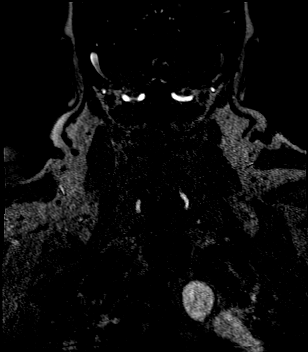
[im 96/96]
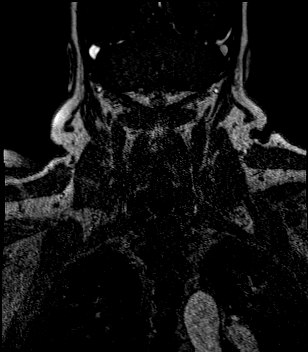

[Series 22: angio_fl3d_cor_post_ttc=2.0s_moco-adv_sub · coronal · 0.9mm · 0.85mm/px · 9 of 96 slices shown]
[im 1/96]
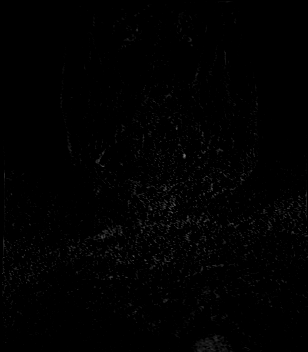
[im 12/96]
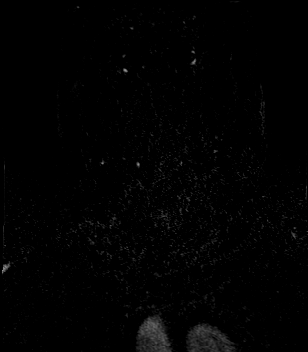
[im 24/96]
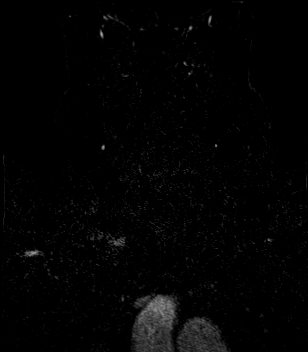
[im 36/96]
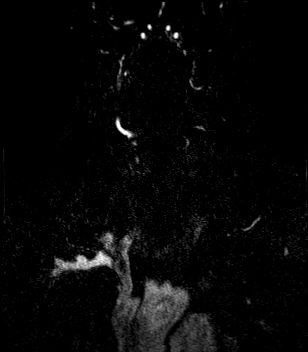
[im 48/96]
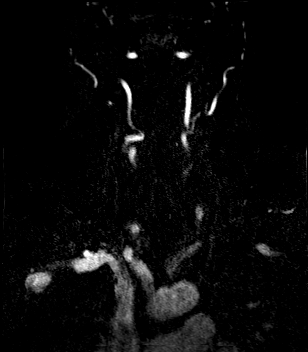
[im 60/96]
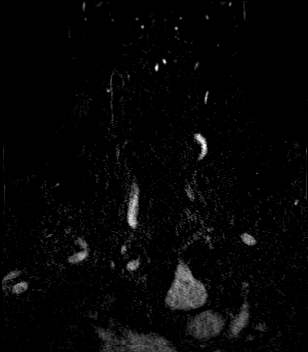
[im 72/96]
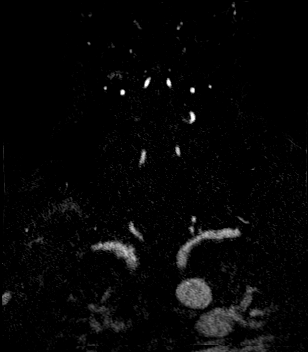
[im 84/96]
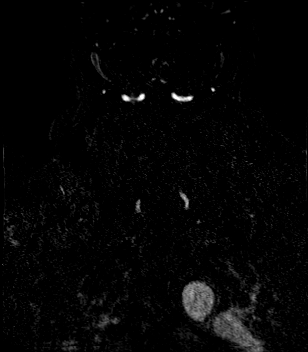
[im 96/96]
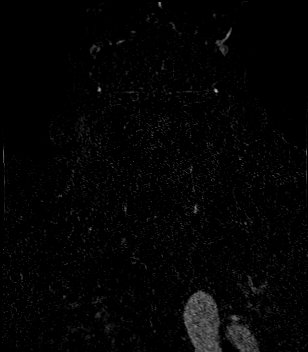

[36 of 48 positions shown; findings below may reference images not displayed]

FINDINGS: MRA NECK FINDINGS

Included aortic arch is unremarkable. Great vessel origins are
patent. Common, internal, and external carotid arteries are patent.
Extracranial vertebral arteries are patent and codominant. The
vertebral artery origins are not well evaluated due to artifact. No
hemodynamically significant stenosis or evidence of dissection.

MRA HEAD FINDINGS

Intracranial internal carotid arteries are patent. Anterior and
middle cerebral arteries are patent.

Intracranial vertebral arteries are patent. Basilar artery is
patent. Major cerebellar artery origins are patent. Posterior
cerebral arteries are patent. A small left posterior communicating
artery is identified.

No significant stenosis or aneurysm.
IMPRESSION: No large vessel occlusion or hemodynamically significant stenosis.

## 2021-06-04 MED ORDER — FERROUS SULFATE 325 (65 FE) MG PO TABS: 325.0000 mg | ORAL_TABLET | ORAL | 0 refills | Status: DC

## 2021-06-04 MED ORDER — ELIQUIS 5 MG PO TABS: 5.0000 mg | ORAL_TABLET | Freq: Two times a day (BID) | ORAL | Status: DC

## 2021-06-04 MED ORDER — ASPIRIN 325 MG PO TBEC: 325.0000 mg | DELAYED_RELEASE_TABLET | Freq: Every day | ORAL | Status: DC

## 2021-06-04 MED ORDER — GADOBUTROL 1 MMOL/ML IV SOLN
10.0000 mL | Freq: Once | INTRAVENOUS | Status: AC | PRN
  Administered 2021-06-04: 10 mL via INTRAVENOUS

## 2021-06-04 MED ORDER — SODIUM CHLORIDE 0.9 % IV SOLN
200.0000 mg | Freq: Once | INTRAVENOUS | Status: AC
  Administered 2021-06-04: 200 mg via INTRAVENOUS
  Filled 2021-06-04: qty 10

## 2021-06-04 NOTE — Plan of Care (Signed)
?  Problem: Education: ?Goal: Knowledge of disease or condition will improve ?Outcome: Progressing ?Goal: Knowledge of secondary prevention will improve (SELECT ALL) ?Outcome: Progressing ?Goal: Knowledge of patient specific risk factors will improve (INDIVIDUALIZE FOR PATIENT) ?Outcome: Progressing ?  ?

## 2021-06-04 NOTE — Discharge Summary (Addendum)
?Physician Discharge Summary ?  ?Patient: Christian Clark MRN: [id number] DOB: 1945-06-13  ?Admit date:     06/03/2021  ?Discharge date:   ?Discharge Physician: Lurene Shadow  ? ?PCP: Lauro Regulus, MD  ? ?Recommendations at discharge:  ? ?Follow-up with PCP in 1 week ?Follow-up with [facility] neurologist in 1 month ?Follow-up with Dr. Tobi Bastos, gastroenterologist, in 1 month ? ?Discharge Diagnoses: ?Principal Problem: ?  CVA (cerebral vascular accident) (HCC) ?Active Problems: ?  Atrial fibrillation, chronic (HCC) ?  Iron deficiency anemia due to chronic blood loss ?  GERD (gastroesophageal reflux disease) ?  Acute CVA (cerebrovascular accident) (HCC) ? ?Resolved Problems: ?  * No resolved hospital problems. * ? ?Hospital Course: ?Mr. Christian Clark is a 76 year old man with medical history significant for permanent atrial fibrillation on Eliquis, history of GERD who presented from the assisted living facility to the emergency room because of left facial droop and slurred speech.  Symptoms had been going on for about 3 to 4 days.  He also complained of shortness of breath worse with exertion and easy fatigability.   ? ?He was found to have multifocal acute ischemia within the right frontal lobe and iron deficiency anemia.  He was evaluated by the neurologist who recommended aspirin through 06/07/2021 followed by resumption of home dose of Eliquis (this was discussed with Dr. Amada Jupiter, neurologist).  He was transfused with 2 units of packed red blood cells and was given 1 dose of IV iron sucrose.  He was evaluated by the gastroenterologist for iron deficiency anemia and outpatient follow-up was recommended. ? ?Patient has no problems with his gait or balance and he has been able to ambulate without any issues.  He has prednisone on his admission med rec but he said he does not know who prescribed it and he does not know why it was prescribed.  Prednisone has been discontinued at discharge.  His condition has  improved and he is deemed stable for discharge to home today. ? ?  ? ? ?Consultants: Gastroenterologist, neurologist ?Procedures performed: None ?Disposition: Home ?Diet recommendation:  ?Discharge Diet Orders (From admission, onward)  ? ?  Start     Ordered  ? 06/04/21 0000  Diet - low sodium heart healthy       ? 06/04/21 1637  ? ?  ?  ? ?  ? ?Cardiac diet ?DISCHARGE MEDICATION: ?Allergies as of 06/04/2021   ? ?   Reactions  ? Chocolate Hives, Itching  ? Iodine Hives  ? Peanut Oil Hives, Itching  ? Penicillins Hives  ? Shellfish Allergy Hives  ? ?  ? ?  ?Medication List  ?  ? ?STOP taking these medications   ? ?predniSONE 10 MG tablet ?Commonly known as: DELTASONE ?  ?traMADol 50 MG tablet ?Commonly known as: ULTRAM ?  ? ?  ? ?TAKE these medications   ? ?acetaminophen 500 MG tablet ?Commonly known as: TYLENOL ?Take 1,000 mg by mouth every 8 (eight) hours. ?  ?aspirin EC 325 MG tablet ?Take 1 tablet (325 mg total) by mouth daily. ?  ?Camphor-Menthol-Methyl Sal 3.01-25-08 % Ptch ?Apply 1 patch topically daily at 6 (six) AM. ?  ?carvedilol 3.125 MG tablet ?Commonly known as: COREG ?Take 3.125 mg by mouth 2 (two) times daily. ?  ?cetirizine 10 MG tablet ?Commonly known as: ZYRTEC ?Take 10 mg by mouth daily. ?  ?diphenhydrAMINE 25 mg capsule ?Commonly known as: BENADRYL ?Take 25 mg by mouth every 6 (six) hours as needed for allergies  or itching. ?  ?Eliquis 5 MG Tabs tablet ?Generic drug: apixaban ?Take 1 tablet (5 mg total) by mouth 2 (two) times daily. ?  ?ergocalciferol 1.25 MG (50000 UT) capsule ?Commonly known as: VITAMIN D2 ?Take 1 capsule by mouth once a week. Given Every Wednesday ?  ?esomeprazole 40 MG capsule ?Commonly known as: NEXIUM ?Take 40 mg by mouth daily. ?  ?ferrous sulfate 325 (65 FE) MG tablet ?Take 1 tablet (325 mg total) by mouth every other day. ?  ?Magnesium 200 MG Tabs ?Take 400 mg by mouth daily at 6 (six) AM. ?  ?methocarbamol 500 MG tablet ?Commonly known as: ROBAXIN ?Take 500 mg by mouth 2  (two) times daily. For muscle spasms ?  ?potassium chloride 10 MEQ tablet ?Commonly known as: KLOR-CON M ?Take by mouth daily. ?  ?pregabalin 75 MG capsule ?Commonly known as: LYRICA ?Take by mouth 2 (two) times daily. ?  ?rosuvastatin 20 MG tablet ?Commonly known as: CRESTOR ?Take 20 mg by mouth daily. ?  ?torsemide 20 MG tablet ?Commonly known as: DEMADEX ?Take 20 mg by mouth daily. For edema and fluid retention ?  ? ?  ? ? Follow-up Information   ? ? Wyline MoodAnna, Kiran, MD. Schedule an appointment as soon as possible for a visit in 1 month(s).   ?Specialty: Gastroenterology ?Contact information: ?[address] ?[address] ?[place] [id number] ?[phone] ? ? ?  ?  ? ?  ?  ? ?  ? ?Discharge Exam: ?Ceasar MonsFiled Weights  ? 06/03/21 1157  ?Weight: 100.9 kg  ? ?GEN: NAD ?SKIN: No rash ?EYES: EOMI ?ENT: MMM ?CV: RRR ?PULM: CTA B ?ABD: soft, ND, NT, +BS ?CNS: AAO x 3, mild slurred speech, left facial droop ?EXT: No edema or tenderness ? ? ?Condition at discharge: good ? ?The results of significant diagnostics from this hospitalization (including imaging, microbiology, ancillary and laboratory) are listed below for reference.  ? ?Imaging Studies: ?MR ANGIO HEAD WO CONTRAST ? ?Result Date: 06/04/2021 ?CLINICAL DATA:  Stroke, follow-up; abnormal brain MRI EXAM: MRA NECK WITHOUT AND WITH CONTRAST MRA HEAD WITHOUT CONTRAST TECHNIQUE: Multiplanar and multiecho pulse sequences of the neck were obtained without and with intravenous contrast. Angiographic images of the neck were obtained using MRA technique without and with intravenous contrast; Angiographic images of the Circle of Willis were obtained using MRA technique without intravenous contrast. CONTRAST:  10mL GADAVIST GADOBUTROL 1 MMOL/ML IV SOLN COMPARISON:  None Available. FINDINGS: MRA NECK FINDINGS Included aortic arch is unremarkable. Great vessel origins are patent. Common, internal, and external carotid arteries are patent. Extracranial vertebral arteries are patent and  codominant. The vertebral artery origins are not well evaluated due to artifact. No hemodynamically significant stenosis or evidence of dissection. MRA HEAD FINDINGS Intracranial internal carotid arteries are patent. Anterior and middle cerebral arteries are patent. Intracranial vertebral arteries are patent. Basilar artery is patent. Major cerebellar artery origins are patent. Posterior cerebral arteries are patent. A small left posterior communicating artery is identified. No significant stenosis or aneurysm. IMPRESSION: No large vessel occlusion or hemodynamically significant stenosis. Electronically Signed   By: Guadlupe SpanishPraneil  Patel M.D.   On: 06/04/2021 14:20  ? ?MR ANGIO NECK W WO CONTRAST ? ?Result Date: 06/04/2021 ?CLINICAL DATA:  Stroke, follow-up; abnormal brain MRI EXAM: MRA NECK WITHOUT AND WITH CONTRAST MRA HEAD WITHOUT CONTRAST TECHNIQUE: Multiplanar and multiecho pulse sequences of the neck were obtained without and with intravenous contrast. Angiographic images of the neck were obtained using MRA technique without and with intravenous  contrast; Angiographic images of the Circle of Willis were obtained using MRA technique without intravenous contrast. CONTRAST:  13mL GADAVIST GADOBUTROL 1 MMOL/ML IV SOLN COMPARISON:  None Available. FINDINGS: MRA NECK FINDINGS Included aortic arch is unremarkable. Great vessel origins are patent. Common, internal, and external carotid arteries are patent. Extracranial vertebral arteries are patent and codominant. The vertebral artery origins are not well evaluated due to artifact. No hemodynamically significant stenosis or evidence of dissection. MRA HEAD FINDINGS Intracranial internal carotid arteries are patent. Anterior and middle cerebral arteries are patent. Intracranial vertebral arteries are patent. Basilar artery is patent. Major cerebellar artery origins are patent. Posterior cerebral arteries are patent. A small left posterior communicating artery is identified. No  significant stenosis or aneurysm. IMPRESSION: No large vessel occlusion or hemodynamically significant stenosis. Electronically Signed   By: Guadlupe Spanish M.D.   On: 06/04/2021 14:20  ? ?MR BRAIN WO CO

## 2021-06-04 NOTE — Evaluation (Signed)
Speech Language Pathology Evaluation ?Patient Details ?Name: Christian Clark ?MRN: [id number] ?DOB: 11-01-1945 ?Today's Date: 06/04/2021 ?Time: [id number] ?SLP Time Calculation (min) (ACUTE ONLY): 40 min ? ?Problem List:  ?Patient Active Problem List  ? Diagnosis Date Noted  ? Acute CVA (cerebrovascular accident) ([place]) 06/04/2021  ? CVA (cerebral vascular accident) ([place]) 06/03/2021  ? Iron deficiency anemia due to chronic blood loss 06/03/2021  ? GERD (gastroesophageal reflux disease)   ? Frequent falls 11/28/2020  ? Atrial fibrillation, chronic ([place]) 11/28/2020  ? Sepsis ([place]) 11/28/2020  ? Alcohol abuse 11/28/2020  ? AMS (altered mental status) 11/28/2020  ? ?Past Medical History:  ?Past Medical History:  ?Diagnosis Date  ? GERD (gastroesophageal reflux disease)   ? Substance abuse ([place])   ? ?Past Surgical History:  ?Past Surgical History:  ?Procedure Laterality Date  ? JOINT REPLACEMENT    ? ?HPI:  ?Pt  is a 76 y.o. male with medical history significant for atrial fibrillation on chronic anticoagulation therapy, history of GERD w/ monitoring for Barrett's Esophagus(GI) who resides in an assisted living facility and presents to the ER as a code stroke for evaluation of left facial droop and slurred speech which she has had for several days (at least 3 days).  He denies having any difficulty swallowing, no headache, no blurred vision, no focal deficits, no falls, no headache, no dizziness or lightheadedness.  He has had dark stools but denies having any frank blood or hematemesis.  Head CT: Extensive chronic ischemic change including multiple remote  infarcts and patchy white matter hypoattenuation. Cerebral atrophy.  No definite evidence of acute large vascular territory infarct.  MRI revealed: "Multifocal acute ischemia within the right frontal lobe. No hemorrhage or mass effect.  2. Findings of chronic ischemic microangiopathy and multiple old infarcts. 3. Multifocal chronic microhemorrhage.".  ? ?Assessment /  Plan / Recommendation ?Clinical Impression ? Pt appears to present w/ mild-moderate Dysarthria impacting his verbal communication at the word-conversation levels; intelligibility is 90-100%. When pt utilized strategies of slowing rate of speech, emphasizing speech sounds, and supporting words w/ full breath support (getting Louder), pt's Dysarthria lessened and articulation of speech improved. OM Exam revealed Moderate Left unilateral labial and lingual weakness; Left lingual deviation and lateral weakness. Pt exhibits, and c/o, increased saliva orally(noticed for several days prior). W/ lingual sweeping and f/u swallow, the saliva was cleared. ?Education and practice of articulation strategies was completed w/ pt w/ focus on over-articulation and slowing down when talking -- pt is quite talkative. He demonstrated teach-back w/ accuracy and stated he noted the difference when using the strategies. Instructed him to use lingual sweeping and dry swallows to clear the saliva when present; stated this was something he may have to do at rest as well as during talking. Pt agreed. ?No apparent expressive or receptive aphasia noted, no cognitive deficits noted. Discussed strategies w/ pt; gave Handouts.  ? ?Recommend pt f/u w/ Outpatient skilled ST servcies to address Dysarthria post Discharge home in order to improve verbal communication in ADLs. CM/NSG/MD updated. Pt agreed.   ?SLP Assessment ? SLP Recommendation/Assessment: All further Speech Lanaguage Pathology  needs can be addressed in the next venue of care ?SLP Visit Diagnosis: Dysarthria and anarthria (R47.1)  ?  ?Recommendations for follow up therapy are one component of a multi-disciplinary discharge planning process, led by the attending physician.  Recommendations may be updated based on patient status, additional functional criteria and insurance authorization. ?   ?Follow Up Recommendations ? Outpatient SLP (for Dysarthria)  ?  ?  Assistance Recommended at  Discharge ? None  ?Functional Status Assessment Patient has had a recent decline in their functional status and demonstrates the ability to make significant improvements in function in a reasonable and predictable amount of time.  ?Frequency and Duration   TBD by Outpt ?  ?  ?   ?SLP Evaluation ?Cognition ? Overall Cognitive Status: Within Functional Limits for tasks assessed ?Arousal/Alertness: Awake/alert ?Orientation Level: Oriented X4 ?Year: 2023 ?Month: May ?Attention: Focused;Sustained ?Focused Attention: Appears intact ?Sustained Attention: Appears intact ?Memory: Appears intact ?Awareness: Appears intact ?Problem Solving: Appears intact ?Executive Function: Reasoning ?Reasoning: Appears intact ?Behaviors:  (n/a) ?Safety/Judgment: Appears intact ?Comments: just frustrated w/ the situation during his move at home  ?  ?   ?Comprehension ? Auditory Comprehension ?Overall Auditory Comprehension: Appears within functional limits for tasks assessed ?Visual Recognition/Discrimination ?Discrimination: Not tested ?Reading Comprehension ?Reading Status: Within funtional limits  ?  ?Expression Expression ?Primary Mode of Expression: Verbal ?Verbal Expression ?Overall Verbal Expression: Appears within functional limits for tasks assessed ?Written Expression ?Dominant Hand: Right ?Written Expression: Not tested   ?Oral / Motor ? Oral Motor/Sensory Function ?Overall Oral Motor/Sensory Function: Moderate impairment ?Facial ROM: Reduced left;Suspected CN VII (facial) dysfunction ?Facial Symmetry: Abnormal symmetry left;Suspected CN VII (facial) dysfunction ?Facial Strength: Reduced left;Suspected CN VII (facial) dysfunction ?Facial Sensation: Within Functional Limits ?Lingual ROM: Reduced left;Suspected CN XII (hypoglossal) dysfunction ?Lingual Symmetry: Abnormal symmetry left;Suspected CN XII (hypoglossal) dysfunction ?Lingual Strength: Reduced;Suspected CN XII (hypoglossal) dysfunction ?Lingual Sensation: Within Functional  Limits ?Velum: Within Functional Limits ?Mandible: Within Functional Limits ?Motor Speech ?Overall Motor Speech: Impaired ?Respiration: Within functional limits ?Phonation: Normal ?Resonance: Within functional limits ?Articulation: Impaired ?Level of Impairment: Conversation ?Intelligibility: Intelligibility reduced (slight-min overall) ?Word: 75-100% accurate ?Phrase: 75-100% accurate ?Sentence: 75-100% accurate ?Conversation: 75-100% accurate ?Motor Speech Errors: Not applicable ?Effective Techniques: Slow rate;Increased vocal intensity;Over-articulate   ?        ? ? ? ? ?Orinda Kenner, MS, CCC-SLP ?Speech Language Pathologist ?Rehab Services; [place] ?[phone] (ascom) ?Magdalyn Arenivas ?06/04/2021, 5:18 PM ? ?

## 2021-06-04 NOTE — Progress Notes (Signed)
Speech Language Pathology Treatment: Dysphagia  ?Patient Details ?Name: Christian Clark ?MRN: [id number] ?DOB: [birth year] ?Today's Date: 06/04/2021 ?Time: 1856-3149 ?SLP Time Calculation (min) (ACUTE ONLY): 30 min ? ?Assessment / Plan / Recommendation ?Clinical Impression ? Pt seen for ongoing assessment of swallowing, toleration of diet, and trials to upgrade to solid foods. He is alert, verbally responsive and engaged in conversation w/ SLP; min talkative and quite pleasant. Pt is on RA; wbc wnl. Native Dentition. Pt denied any swallowing deficits w/ the full liquid diet; stated he was ready for solid foods. Noted continued L orofacial weakness and Dysarthria; speech intelligibility 90-100%.  ? ?Pt explained general aspiration precautions and agreed verbally to the need for following them especially sitting upright for all oral intake -- pt sat in the chair for best support. He was able to recall aspiration precautions discussed at evaluation yesterday PM including small, single sips. Instructed him to carry this over to foods for small, single bites and to clear mouth fully b/t bites. He fed himself trials of purees moistening solids and thin liquids via cup. NO overt clinical s/s of aspiration were noted w/ any consistency; respiratory status remained calm and unlabored, vocal quality clear b/t trials, no coughing occurred w/ any of the po's consumed. Pt stated the swallowing felt "better" and he wasn't as "bothered by the feeling in the back of my throat". Discussed w/ him the focus needed during oral intake w/ the po's as well as to manage his saliva (still has extra saliva in his mouth at times). Instructed him to use lingual sweeping and dry swallows to clear the saliva when present which he was able to do -- discussed this was something he may have to do at rest as well during talking. Pt agreed.  ?Oral phase appeared [facility] for bolus management and timely Christian-P transfer for swallowing; oral clearing achieved  w/ all consistencies. No anterior leakage of food nor saliva. Discussed use of Christian Puree for pill swallowing -- the puree providing cohesion for swallowing tablets. Pt agreed. NSG denied any deficits in swallowing as well since yesterday. ?  ?Pt appears at reduced risk for aspiration when following general aspiration precautions in setting of new R CVA w/ residual Left oral weakness. Recommend upgrade to Christian Regular/mech soft diet for ease of soft foods w/ gravies added to moisten foods; Thin liquids VIA CUP. Recommend general aspiration precautions as educated on; Pills Whole in Puree; less talking and distractions at meals. ST services can be available if new swallowing issues arise during admit w/ MD to reconsult if needed. Precautions posted. Handouts given to pt on general aspiration precautions.  ? ? ?   ?HPI HPI: Pt  is Christian Clark with medical history significant for atrial fibrillation on chronic anticoagulation therapy, history of GERD w/ monitoring for Barrett's Esophagus(GI) who resides in an assisted living facility and presents to the ER as Christian code stroke for evaluation of left facial droop and slurred speech which she has had for several days (at least 3 days).  He denies having any difficulty swallowing, no headache, no blurred vision, no focal deficits, no falls, no headache, no dizziness or lightheadedness.  He has had dark stools but denies having any frank blood or hematemesis.  Head CT: Extensive chronic ischemic change including multiple remote  infarcts and patchy white matter hypoattenuation. Cerebral atrophy.  No definite evidence of acute large vascular territory infarct.  MRI pending. ?  ?   ?SLP Plan ? All  goals met ? ?  ?  ?Recommendations for follow up therapy are one component of Christian multi-disciplinary discharge planning process, led by the attending physician.  Recommendations may be updated based on patient status, additional functional criteria and insurance authorization. ?   ? ?Recommendations  ?Diet recommendations: Regular;Thin liquid (cut meats, moistened foods) ?Liquids provided via: Cup;No straw ?Medication Administration: Whole meds with puree (for safer swallowing) ?Supervision: Patient able to self feed ?Compensations: Minimize environmental distractions;Slow rate;Small sips/bites;Lingual sweep for clearance of pocketing;Multiple dry swallows after each bite/sip;Follow solids with liquid (Less Talking around po's/meals) ?Postural Changes and/or Swallow Maneuvers: Out of bed for meals;Seated upright 90 degrees;Upright 30-60 min after meal  ?   ?    ?   ? ? ? ? Oral Care Recommendations: Oral care BID;Oral care before and after PO;Patient independent with oral care ?Follow Up Recommendations: No SLP follow up (for swallowing) ?Assistance recommended at discharge: None ?SLP Visit Diagnosis: Dysphagia, unspecified (R13.10) ?Plan: All goals met ? ? ? ? ?  ?  ? ? ? ? ?Orinda Kenner, MS, CCC-SLP ?Speech Language Pathologist ?Rehab Services; [place] ?[phone] (ascom) ? ?Bilaal Leib ? ?06/04/2021, 4:59 PM ?

## 2021-06-04 NOTE — Progress Notes (Incomplete)
{  Select_TRH_Note:[id number]} 

## 2021-06-04 NOTE — Progress Notes (Signed)
Patient stated he is adamant he needs to be discharged by 0900. He has already called his facility, [facility], and set up a ride with Toby who does the transportation. He is moving into an independent living space there. This is something that was previously arranged prior to his being admitted. MD Ayiku notified. ?

## 2021-06-05 LAB — TYPE AND SCREEN
ABO/RH(D): O POS
Antibody Screen: NEGATIVE
Unit division: 0
Unit division: 0

## 2021-06-05 LAB — BPAM RBC
Blood Product Expiration Date: 202305172359
Blood Product Expiration Date: 202306172359
ISSUE DATE / TIME: 202305161335
ISSUE DATE / TIME: 202305170037
Unit Type and Rh: 5100
Unit Type and Rh: 9500

## 2021-07-08 ENCOUNTER — Telehealth: Payer: Self-pay

## 2021-07-08 NOTE — Telephone Encounter (Signed)
Called to schedule EP consult with Dr. Lalla Brothers.  Left message to call back.

## 2021-07-09 NOTE — Telephone Encounter (Signed)
The patient is extremely frustrated with the appointment making process for all of the referrals Dr. Dareen Piano made. He is extremely concerned about his anemia and is angry GI has not called to arrange an appointment. Encouraged the patient to speak with his PCP's nurse and maybe she can help expedite referrals.  Scheduled the patient for visit with Dr. Lalla Brothers 07/16/2021 as he wants to be seen ASAP. He understands that plans at that visit may be delayed until GI work up is complete.  He was grateful for call and agrees with plan.

## 2021-07-09 NOTE — Telephone Encounter (Signed)
Attempted to call patient. VM was full- unable to leave message.  Will try again later.

## 2021-07-16 ENCOUNTER — Telehealth: Payer: Self-pay | Admitting: Cardiology

## 2021-07-16 ENCOUNTER — Institutional Professional Consult (permissible substitution): Payer: Medicare Other | Admitting: Cardiology

## 2021-07-16 NOTE — Telephone Encounter (Signed)
Patient was to be seen by Dr. Lalla Brothers today for [facility] consultation. His appointment was cancelled for unclear reasons. Attempted to reach the patient by phone with no answer and no availability to leave a voice message.   Georgie Chard NP-C Structural Heart Team  Pager: [phone] Phone: [phone]

## 2021-07-23 ENCOUNTER — Encounter: Payer: Self-pay | Admitting: *Deleted

## 2021-07-28 NOTE — Telephone Encounter (Signed)
Spoke with Dr. Ewell Poe nurse, Amil Amen. Informed her that the patient had an appointment scheduled with Dr. Lalla Brothers for EP needs and potential Watchman since he has a history of bleeding.  Informed her I personally spoke with the patient twice about his consult and confirmed the appointment several times. Informed her the patient no-showed to his consult and his reason for missing the appointment was that he "didn't know about it." Amil Amen said the patient keeps scheduling with [facility] despite her telling him they do not address EP needs there. Informed Amil Amen calls have been made to reschedule the appointment but no contact has been made with the patient.  Barbarann Ehlers my direct phone line should the patient call her back.

## 2021-07-28 NOTE — Telephone Encounter (Signed)
Spoke with Dr. Anderson's nurse, Julia. Informed her that the patient had an appointment scheduled with Dr. Lambert for EP needs and potential Watchman since he has a history of bleeding.  Informed her I personally spoke with the patient twice about his consult and confirmed the appointment several times. Informed her the patient no-showed to his consult and his reason for missing the appointment was that he "didn't know about it." Julia said the patient keeps scheduling with [facility] despite her telling him they do not address EP needs there. Informed Julia calls have been made to reschedule the appointment but no contact has been made with the patient.  Gave Julia my direct phone line should the patient call her back.  

## 2021-09-05 ENCOUNTER — Encounter (HOSPITAL_COMMUNITY): Payer: Self-pay

## 2021-09-05 ENCOUNTER — Other Ambulatory Visit: Payer: Self-pay

## 2021-09-05 ENCOUNTER — Inpatient Hospital Stay (HOSPITAL_COMMUNITY)
Admission: EM | Admit: 2021-09-05 | Discharge: 2021-09-10 | DRG: 682 | Disposition: A | Discharge status: Home / Self Care | Payer: Medicare Other | Attending: Internal Medicine | Admitting: Internal Medicine

## 2021-09-05 DIAGNOSIS — Z7982 Long term (current) use of aspirin: Secondary | ICD-10-CM

## 2021-09-05 DIAGNOSIS — Z79899 Other long term (current) drug therapy: Secondary | ICD-10-CM

## 2021-09-05 DIAGNOSIS — R4182 Altered mental status, unspecified: Secondary | ICD-10-CM | POA: Diagnosis present

## 2021-09-05 DIAGNOSIS — R410 Disorientation, unspecified: Secondary | ICD-10-CM | POA: Diagnosis not present

## 2021-09-05 DIAGNOSIS — K760 Fatty (change of) liver, not elsewhere classified: Secondary | ICD-10-CM | POA: Diagnosis present

## 2021-09-05 DIAGNOSIS — I7389 Other specified peripheral vascular diseases: Secondary | ICD-10-CM | POA: Diagnosis present

## 2021-09-05 DIAGNOSIS — R739 Hyperglycemia, unspecified: Secondary | ICD-10-CM | POA: Diagnosis present

## 2021-09-05 DIAGNOSIS — Z91010 Allergy to peanuts: Secondary | ICD-10-CM | POA: Diagnosis not present

## 2021-09-05 DIAGNOSIS — Z7901 Long term (current) use of anticoagulants: Secondary | ICD-10-CM

## 2021-09-05 DIAGNOSIS — D6959 Other secondary thrombocytopenia: Secondary | ICD-10-CM | POA: Diagnosis present

## 2021-09-05 DIAGNOSIS — N179 Acute kidney failure, unspecified: Principal | ICD-10-CM | POA: Diagnosis present

## 2021-09-05 DIAGNOSIS — G4700 Insomnia, unspecified: Secondary | ICD-10-CM | POA: Diagnosis present

## 2021-09-05 DIAGNOSIS — E871 Hypo-osmolality and hyponatremia: Secondary | ICD-10-CM | POA: Diagnosis present

## 2021-09-05 DIAGNOSIS — R7303 Prediabetes: Secondary | ICD-10-CM | POA: Diagnosis present

## 2021-09-05 DIAGNOSIS — G9341 Metabolic encephalopathy: Secondary | ICD-10-CM | POA: Diagnosis present

## 2021-09-05 DIAGNOSIS — K219 Gastro-esophageal reflux disease without esophagitis: Secondary | ICD-10-CM | POA: Diagnosis present

## 2021-09-05 DIAGNOSIS — Z91041 Radiographic dye allergy status: Secondary | ICD-10-CM

## 2021-09-05 DIAGNOSIS — I482 Chronic atrial fibrillation, unspecified: Secondary | ICD-10-CM | POA: Diagnosis present

## 2021-09-05 DIAGNOSIS — D509 Iron deficiency anemia, unspecified: Secondary | ICD-10-CM | POA: Diagnosis present

## 2021-09-05 DIAGNOSIS — E878 Other disorders of electrolyte and fluid balance, not elsewhere classified: Secondary | ICD-10-CM | POA: Diagnosis present

## 2021-09-05 DIAGNOSIS — R7989 Other specified abnormal findings of blood chemistry: Secondary | ICD-10-CM | POA: Diagnosis present

## 2021-09-05 DIAGNOSIS — I5022 Chronic systolic (congestive) heart failure: Secondary | ICD-10-CM | POA: Diagnosis present

## 2021-09-05 DIAGNOSIS — Z91013 Allergy to seafood: Secondary | ICD-10-CM

## 2021-09-05 DIAGNOSIS — Z87891 Personal history of nicotine dependence: Secondary | ICD-10-CM | POA: Diagnosis not present

## 2021-09-05 DIAGNOSIS — F10139 Alcohol abuse with withdrawal, unspecified: Secondary | ICD-10-CM | POA: Diagnosis present

## 2021-09-05 DIAGNOSIS — Z880 Allergy status to penicillin: Secondary | ICD-10-CM

## 2021-09-05 DIAGNOSIS — F1010 Alcohol abuse, uncomplicated: Secondary | ICD-10-CM | POA: Diagnosis not present

## 2021-09-05 DIAGNOSIS — Z91018 Allergy to other foods: Secondary | ICD-10-CM

## 2021-09-05 DIAGNOSIS — E860 Dehydration: Secondary | ICD-10-CM | POA: Diagnosis present

## 2021-09-05 DIAGNOSIS — E8729 Other acidosis: Secondary | ICD-10-CM | POA: Diagnosis present

## 2021-09-05 DIAGNOSIS — Z8673 Personal history of transient ischemic attack (TIA), and cerebral infarction without residual deficits: Secondary | ICD-10-CM | POA: Diagnosis not present

## 2021-09-05 LAB — CBC WITH DIFFERENTIAL/PLATELET
Abs Immature Granulocytes: 0.03 10*3/uL (ref 0.00–0.07)
Basophils Absolute: 0 10*3/uL (ref 0.0–0.1)
Basophils Relative: 1 %
Eosinophils Absolute: 0 10*3/uL (ref 0.0–0.5)
Eosinophils Relative: 1 %
HCT: 38.2 % — ABNORMAL LOW (ref 39.0–52.0)
Hemoglobin: 12.2 g/dL — ABNORMAL LOW (ref 13.0–17.0)
Immature Granulocytes: 1 %
Lymphocytes Relative: 15 %
Lymphs Abs: 0.9 10*3/uL (ref 0.7–4.0)
MCH: 26.3 pg (ref 26.0–34.0)
MCHC: 31.9 g/dL (ref 30.0–36.0)
MCV: 82.3 fL (ref 80.0–100.0)
Monocytes Absolute: 0.5 10*3/uL (ref 0.1–1.0)
Monocytes Relative: 8 %
Neutro Abs: 4.4 10*3/uL (ref 1.7–7.7)
Neutrophils Relative %: 74 %
Platelets: 137 10*3/uL — ABNORMAL LOW (ref 150–400)
RBC: 4.64 MIL/uL (ref 4.22–5.81)
RDW: 19.3 % — ABNORMAL HIGH (ref 11.5–15.5)
WBC: 5.8 10*3/uL (ref 4.0–10.5)
nRBC: 0 % (ref 0.0–0.2)

## 2021-09-05 LAB — BASIC METABOLIC PANEL
Anion gap: 18 — ABNORMAL HIGH (ref 5–15)
BUN: 18 mg/dL (ref 8–23)
CO2: 21 mmol/L — ABNORMAL LOW (ref 22–32)
Calcium: 9 mg/dL (ref 8.9–10.3)
Chloride: 95 mmol/L — ABNORMAL LOW (ref 98–111)
Creatinine, Ser: 1.3 mg/dL — ABNORMAL HIGH (ref 0.61–1.24)
GFR, Estimated: 58 mL/min — ABNORMAL LOW (ref >60)
Glucose, Bld: 170 mg/dL — ABNORMAL HIGH (ref 70–99)
Potassium: 4.4 mmol/L (ref 3.5–5.1)
Sodium: 134 mmol/L — ABNORMAL LOW (ref 135–145)

## 2021-09-05 LAB — LACTIC ACID, PLASMA: Lactic Acid, Venous: 1.7 mmol/L (ref 0.5–1.9)

## 2021-09-05 LAB — PHOSPHORUS: Phosphorus: 4 mg/dL (ref 2.5–4.6)

## 2021-09-05 LAB — MAGNESIUM: Magnesium: 1.6 mg/dL — ABNORMAL LOW (ref 1.7–2.4)

## 2021-09-05 MED ORDER — LORAZEPAM 1 MG PO TABS
0.0000 mg | ORAL_TABLET | Freq: Two times a day (BID) | ORAL | Status: AC
  Administered 2021-09-07: 1 mg via ORAL
  Filled 2021-09-05: qty 1

## 2021-09-05 MED ORDER — TORSEMIDE 20 MG PO TABS
20.0000 mg | ORAL_TABLET | Freq: Every day | ORAL | Status: DC
  Administered 2021-09-06: 20 mg via ORAL
  Filled 2021-09-05: qty 1

## 2021-09-05 MED ORDER — FOLIC ACID 1 MG PO TABS
1.0000 mg | ORAL_TABLET | Freq: Every day | ORAL | Status: DC
  Administered 2021-09-06 – 2021-09-10 (×5): 1 mg via ORAL
  Filled 2021-09-05 (×5): qty 1

## 2021-09-05 MED ORDER — CARVEDILOL 3.125 MG PO TABS
3.1250 mg | ORAL_TABLET | Freq: Two times a day (BID) | ORAL | Status: DC
  Administered 2021-09-05 – 2021-09-10 (×9): 3.125 mg via ORAL
  Filled 2021-09-05 (×10): qty 1

## 2021-09-05 MED ORDER — POTASSIUM CHLORIDE CRYS ER 10 MEQ PO TBCR
10.0000 meq | EXTENDED_RELEASE_TABLET | Freq: Every day | ORAL | Status: DC
  Administered 2021-09-06 – 2021-09-10 (×5): 10 meq via ORAL
  Filled 2021-09-05 (×5): qty 1

## 2021-09-05 MED ORDER — THIAMINE HCL 100 MG/ML IJ SOLN
100.0000 mg | Freq: Once | INTRAMUSCULAR | Status: AC
  Administered 2021-09-05: 100 mg via INTRAVENOUS
  Filled 2021-09-05: qty 2

## 2021-09-05 MED ORDER — PANTOPRAZOLE SODIUM 40 MG PO TBEC
80.0000 mg | DELAYED_RELEASE_TABLET | Freq: Every day | ORAL | Status: DC
  Administered 2021-09-06 – 2021-09-10 (×5): 80 mg via ORAL
  Filled 2021-09-05 (×5): qty 2

## 2021-09-05 MED ORDER — FOLIC ACID 1 MG PO TABS
1.0000 mg | ORAL_TABLET | Freq: Every day | ORAL | Status: DC
  Administered 2021-09-05: 1 mg via ORAL
  Filled 2021-09-05: qty 1

## 2021-09-05 MED ORDER — SODIUM CHLORIDE 0.9 % IV BOLUS
500.0000 mL | Freq: Once | INTRAVENOUS | Status: AC
  Administered 2021-09-05: 500 mL via INTRAVENOUS

## 2021-09-05 MED ORDER — SODIUM CHLORIDE 0.45 % IV SOLN: INTRAVENOUS | Status: DC

## 2021-09-05 MED ORDER — LORAZEPAM 1 MG PO TABS
0.0000 mg | ORAL_TABLET | Freq: Four times a day (QID) | ORAL | Status: AC
  Administered 2021-09-05 – 2021-09-07 (×3): 1 mg via ORAL
  Filled 2021-09-05 (×3): qty 1

## 2021-09-05 MED ORDER — ROSUVASTATIN CALCIUM 20 MG PO TABS
20.0000 mg | ORAL_TABLET | Freq: Every day | ORAL | Status: DC
  Administered 2021-09-06: 20 mg via ORAL
  Filled 2021-09-05: qty 1

## 2021-09-05 MED ORDER — LORAZEPAM 1 MG PO TABS: 1.0000 mg | ORAL_TABLET | ORAL | Status: AC | PRN

## 2021-09-05 MED ORDER — LORAZEPAM 2 MG/ML IJ SOLN: 1.0000 mg | INTRAMUSCULAR | Status: AC | PRN

## 2021-09-05 MED ORDER — ASPIRIN 325 MG PO TBEC: 325.0000 mg | DELAYED_RELEASE_TABLET | Freq: Every day | ORAL | Status: DC

## 2021-09-05 MED ORDER — THIAMINE HCL 100 MG PO TABS
100.0000 mg | ORAL_TABLET | Freq: Every day | ORAL | Status: DC
  Administered 2021-09-06: 100 mg via ORAL
  Filled 2021-09-05: qty 1

## 2021-09-05 MED ORDER — MAGNESIUM OXIDE -MG SUPPLEMENT 400 (240 MG) MG PO TABS: 400.0000 mg | ORAL_TABLET | Freq: Every day | ORAL | Status: DC

## 2021-09-05 MED ORDER — DEXTROSE 5 % IV SOLN: INTRAVENOUS | Status: DC

## 2021-09-05 MED ORDER — ACETAMINOPHEN 500 MG PO TABS: 1000.0000 mg | ORAL_TABLET | Freq: Three times a day (TID) | ORAL | Status: DC | PRN

## 2021-09-05 MED ORDER — APIXABAN 5 MG PO TABS
5.0000 mg | ORAL_TABLET | Freq: Two times a day (BID) | ORAL | Status: DC
  Administered 2021-09-05 – 2021-09-10 (×10): 5 mg via ORAL
  Filled 2021-09-05 (×10): qty 1

## 2021-09-05 MED ORDER — METHOCARBAMOL 500 MG PO TABS
500.0000 mg | ORAL_TABLET | Freq: Two times a day (BID) | ORAL | Status: DC
  Administered 2021-09-05 – 2021-09-10 (×10): 500 mg via ORAL
  Filled 2021-09-05 (×10): qty 1

## 2021-09-05 MED ORDER — ADULT MULTIVITAMIN W/MINERALS CH
1.0000 | ORAL_TABLET | Freq: Every day | ORAL | Status: DC
  Administered 2021-09-06 – 2021-09-10 (×5): 1 via ORAL
  Filled 2021-09-05 (×5): qty 1

## 2021-09-05 NOTE — Assessment & Plan Note (Addendum)
Patient with N/V at [facility], now with mild increase in Cr, increased Anion gap  Plan IV hydration: 1/2 NS @ 100 cc/hr  F/U Bmet in AM

## 2021-09-05 NOTE — Assessment & Plan Note (Signed)
Stable heart rate.  Plan -  med -surg admit, no indication for telemetry  Continue home meds including eliquis

## 2021-09-05 NOTE — ED Provider Notes (Signed)
[facility] EMERGENCY DEPARTMENT Provider Note  CSN: [id number] Arrival date & time: 09/05/21 1949  Chief Complaint(s) Emesis and Nausea  HPI [facility] is a 76 y.o. male with history of alcohol use disorder, A-fib, on Eliquis presenting to the emergency department with nausea and vomiting.  Patient reports for the past week he has had nausea, vomiting, decreased p.o. intake.  He has been drinking alcohol up until yesterday.  He went to treatment center today where apparently labs were drawn and he was found to be dehydrated.  Patient reports mild ongoing nausea, no vomiting.  Denies abdominal pain, shortness of breath, chest pain.  Denies any current tremor.  Denies history of seizure.   Past Medical History Past Medical History:  Diagnosis Date   GERD (gastroesophageal reflux disease)    Substance abuse ([place])    Patient Active Problem List   Diagnosis Date Noted   Acute CVA (cerebrovascular accident) ([place]) 06/04/2021   CVA (cerebral vascular accident) ([place]) 06/03/2021   Iron deficiency anemia due to chronic blood loss 06/03/2021   GERD (gastroesophageal reflux disease)    Frequent falls 11/28/2020   Atrial fibrillation, chronic ([place]) 11/28/2020   Sepsis ([place]) 11/28/2020   Alcohol abuse 11/28/2020   AMS (altered mental status) 11/28/2020   Home Medication(s) Prior to Admission medications   Medication Sig Start Date End Date Taking? Authorizing Provider  acetaminophen (TYLENOL) 500 MG tablet Take 1,000 mg by mouth every 8 (eight) hours.    [provider]  aspirin EC 325 MG tablet Take 1 tablet (325 mg total) by mouth daily. 06/04/21   Jennye Boroughs, MD  Camphor-Menthol-Methyl Sal 3.01-25-08 % PTCH Apply 1 patch topically daily at 6 (six) AM.    [provider]  carvedilol (COREG) 3.125 MG tablet Take 3.125 mg by mouth 2 (two) times daily.    [provider]  cetirizine (ZYRTEC) 10 MG tablet Take 10 mg by mouth daily.     [provider]  diphenhydrAMINE (BENADRYL) 25 mg capsule Take 25 mg by mouth every 6 (six) hours as needed for allergies or itching. 06/02/21   [provider]  ELIQUIS 5 MG TABS tablet Take 1 tablet (5 mg total) by mouth 2 (two) times daily. 06/04/21   Jennye Boroughs, MD  ergocalciferol (VITAMIN D2) 1.25 MG (50000 UT) capsule Take 1 capsule by mouth once a week. Given Every Wednesday 07/25/20   [provider]  esomeprazole (NEXIUM) 40 MG capsule Take 40 mg by mouth daily.    [provider]  ferrous sulfate 325 (65 FE) MG tablet Take 1 tablet (325 mg total) by mouth every other day. 06/04/21 08/03/21  Jennye Boroughs, MD  Magnesium 200 MG TABS Take 400 mg by mouth daily at 6 (six) AM.    [provider]  methocarbamol (ROBAXIN) 500 MG tablet Take 500 mg by mouth 2 (two) times daily. For muscle spasms    [provider]  potassium chloride (KLOR-CON M) 10 MEQ tablet Take by mouth daily.    [provider]  pregabalin (LYRICA) 75 MG capsule Take by mouth 2 (two) times daily. 01/28/21 01/28/22  [provider]  rosuvastatin (CRESTOR) 20 MG tablet Take 20 mg by mouth daily.    [provider]  torsemide (DEMADEX) 20 MG tablet Take 20 mg by mouth daily. For edema and fluid retention 06/02/21   [provider]  Past Surgical History Past Surgical History:  Procedure Laterality Date   JOINT REPLACEMENT     Family History Family History  Problem Relation Age of Onset   Atrial fibrillation Sister     Social History Social History   Tobacco Use   Smoking status: Former    Packs/day: 1.00    Years: 20.00    Total pack years: 20.00    Types: Cigarettes    Quit date: 2010    Years since quitting: 13.6   Smokeless tobacco: Never  Vaping Use   Vaping Use: Never used  Substance Use  Topics   Alcohol use: Not Currently   Drug use: Never   Allergies Chocolate, Iodine, Peanut oil, Penicillins, and Shellfish allergy  Review of Systems Review of Systems  All other systems reviewed and are negative.   Physical Exam Vital Signs  I have reviewed the triage vital signs BP 125/76   Pulse 73   Temp 98.2 F (36.8 C)   Resp 19   Ht 5' 10.5" (1.791 m)   Wt 89.4 kg   SpO2 93%   BMI 27.87 kg/m  Physical Exam Vitals and nursing note reviewed.  Constitutional:      General: He is not in acute distress.    Appearance: Normal appearance.  HENT:     Mouth/Throat:     Mouth: Mucous membranes are dry.  Cardiovascular:     Rate and Rhythm: Regular rhythm. Tachycardia present.  Pulmonary:     Effort: Pulmonary effort is normal. No respiratory distress.     Breath sounds: Normal breath sounds.  Abdominal:     General: Abdomen is flat.     Palpations: Abdomen is soft.     Tenderness: There is no abdominal tenderness.  Skin:    General: Skin is warm and dry.     Capillary Refill: Capillary refill takes less than 2 seconds.  Neurological:     Mental Status: He is alert and oriented to person, place, and time. Mental status is at baseline.  Psychiatric:        Mood and Affect: Mood normal.        Behavior: Behavior normal.     ED Results and Treatments Labs (all labs ordered are listed, but only abnormal results are displayed) Labs Reviewed  BASIC METABOLIC PANEL - Abnormal; Notable for the following components:      Result Value   Sodium 134 (*)    Chloride 95 (*)    CO2 21 (*)    Glucose, Bld 170 (*)    Creatinine, Ser 1.30 (*)    GFR, Estimated 58 (*)    Anion gap 18 (*)    All other components within normal limits  CBC WITH DIFFERENTIAL/PLATELET - Abnormal; Notable for the following components:   Hemoglobin 12.2 (*)    HCT 38.2 (*)    RDW 19.3 (*)    Platelets 137 (*)    All other components within normal limits  BETA-HYDROXYBUTYRIC ACID  LACTIC  ACID, PLASMA  LACTIC ACID, PLASMA  Radiology No results found.  Pertinent labs & imaging results that were available during my care of the patient were reviewed by me and considered in my medical decision making (see MDM for details).  Medications Ordered in ED Medications  folic acid (FOLVITE) tablet 1 mg (1 mg Oral Given 09/05/21 2201)  dextrose 5 % solution ( Intravenous New Bag/Given 09/05/21 2253)  acetaminophen (TYLENOL) tablet 1,000 mg (has no administration in time range)  aspirin EC tablet 325 mg (has no administration in time range)  carvedilol (COREG) tablet 3.125 mg (has no administration in time range)  rosuvastatin (CRESTOR) tablet 20 mg (has no administration in time range)  torsemide (DEMADEX) tablet 20 mg (has no administration in time range)  pantoprazole (PROTONIX) EC tablet 80 mg (has no administration in time range)  apixaban (ELIQUIS) tablet 5 mg (has no administration in time range)  methocarbamol (ROBAXIN) tablet 500 mg (has no administration in time range)  magnesium oxide (MAG-OX) tablet 400 mg (has no administration in time range)  potassium chloride (KLOR-CON M) CR tablet 10 mEq (has no administration in time range)  sodium chloride 0.9 % bolus 500 mL (0 mLs Intravenous Stopped 09/05/21 2252)  thiamine (VITAMIN B1) injection 100 mg (100 mg Intravenous Given 09/05/21 2200)                                                                                                                                     Procedures Procedures  (including critical care time)  Medical Decision Making / ED Course   MDM:  76 year old male presenting to the emergency department with abnormal lab.  Patient well-appearing, appears slightly dehydrated.  Exam otherwise reassuring.  No tremor.  Labs notable for mild anion gap, AKI, low CO2 concerning for mild  acidosis.  Suspect alcoholic ketoacidosis in the setting of decreased p.o. intake.  Will give IV fluids, D5.  Patient also with mild hyperglycemia, but low concern for DKA given relatively low level.  Will give thiamine, folic acid.  Discussed with the hospitalist, will admit the patient for monitoring, rehydration, CIWA protocol.      Additional history obtained: -Additional history obtained from EMS -External records from outside source obtained and reviewed including: Chart review including previous notes, labs, imaging, consultation notes   Lab Tests: -I ordered, reviewed, and interpreted labs.   The pertinent results include:   Labs Reviewed  BASIC METABOLIC PANEL - Abnormal; Notable for the following components:      Result Value   Sodium 134 (*)    Chloride 95 (*)    CO2 21 (*)    Glucose, Bld 170 (*)    Creatinine, Ser 1.30 (*)    GFR, Estimated 58 (*)    Anion gap 18 (*)    All other components within normal limits  CBC WITH DIFFERENTIAL/PLATELET - Abnormal; Notable for the following components:   Hemoglobin 12.2 (*)    HCT 38.2 (*)  RDW 19.3 (*)    Platelets 137 (*)    All other components within normal limits  BETA-HYDROXYBUTYRIC ACID  LACTIC ACID, PLASMA  LACTIC ACID, PLASMA      EKG   EKG Interpretation  Date/Time:    Ventricular Rate:    PR Interval:    QRS Duration:   QT Interval:    QTC Calculation:   R Axis:     Text Interpretation:          Medicines ordered and prescription drug management: Meds ordered this encounter  Medications   sodium chloride 0.9 % bolus [id number] mL   folic acid (FOLVITE) tablet 1 mg   thiamine (VITAMIN B1) injection 100 mg   dextrose 5 % solution   acetaminophen (TYLENOL) tablet 1,000 mg   aspirin EC tablet 325 mg   carvedilol (COREG) tablet 3.125 mg   rosuvastatin (CRESTOR) tablet 20 mg   torsemide (DEMADEX) tablet 20 mg    For edema and fluid retention     pantoprazole (PROTONIX) EC tablet 80 mg   apixaban  (ELIQUIS) tablet 5 mg   methocarbamol (ROBAXIN) tablet 500 mg    For muscle spasms     magnesium oxide (MAG-OX) tablet 400 mg   potassium chloride (KLOR-CON M) CR tablet 10 mEq    -I have reviewed the patients home medicines and have made adjustments as needed    Consultations Obtained: I requested consultation with the hospitalist,  and discussed lab and imaging findings as well as pertinent plan - they recommend: admission   Cardiac Monitoring: The patient was maintained on a cardiac monitor.  I personally viewed and interpreted the cardiac monitored which showed an underlying rhythm of: sinus tachycardia  Social Determinants of Health:  Factors impacting patients care include: alcohol use disorder   Reevaluation: After the interventions noted above, I reevaluated the patient and found that they have :improved  Co morbidities that complicate the patient evaluation  Past Medical History:  Diagnosis Date   GERD (gastroesophageal reflux disease)    Substance abuse ([place])       Dispostion: Admit    Final Clinical Impression(s) / ED Diagnoses Final diagnoses:  Alcoholic ketoacidosis  AKI (acute kidney injury) ([place])     This chart was dictated using voice recognition software.  Despite best efforts to proofread,  errors can occur which can change the documentation meaning.    Cristie [name], MD 09/05/21 2258

## 2021-09-05 NOTE — Assessment & Plan Note (Signed)
Patient seems lucid yet does not recall his medications, cannot explain his complete history, does not understand chonic stasis changes to his legs.  Plan  if with treatment for dehydration and EtOH withdrawal he doesn't improve will need cognitive evaluation.

## 2021-09-05 NOTE — Assessment & Plan Note (Addendum)
Chronic problem. No h/o esophagitis. States he is due for EGD. It has been two years. He is also concerned about active GI issues as cause for 5 days of N/V and loose stools. He reports that in May during admission for CVA he received two unit transfusion.  Plan Continue PPI  Zofran q 4 prn  For refractory symptoms - may need GI consult  Heme test stool, heme test emesis

## 2021-09-05 NOTE — H&P (Signed)
History and Physical    [facility] [id number] DOB: 1945/08/10 DOA: 09/05/2021  DOS: the patient was seen and examined on 09/05/2021  PCP: Christian Ruths, MD   Patient coming from:  [facility]  I have personally briefly reviewed patient's old medical records in [facility]  [facility], a 76 y/o with h/o chronic a. Fib on Eliquis, GERD, CVA, iron deficiency anemia and alcohol abuse. He was resding at [facility] in [facility] living. He reports a 5 day h/o N/V and loose stools, with virtually no PO intake for several days. He was admitted to [facility] for treatment at the direction of [facility] due to behavior change. On the day of admission, today, he was found to be dehydrated. By report, last drink was 24 hrs prior to admission and he was thought to need medical detox. Thus, he is referred to [facility] for evaluation and treatment.    ED Course: afebrile, 125/76 HR 73  RR 19. Bmet with Cr 1.3 (baseline 1.06), CBCD nl. TRH called to admit for further evaluation and treatment  Review of Systems:  Review of Systems  Constitutional:  Positive for weight loss. Negative for chills and fever.  HENT:  Negative for congestion, hearing loss and tinnitus.   Eyes: Negative.   Respiratory:  Negative for cough, sputum production and wheezing.   Cardiovascular:  Positive for leg swelling. Negative for chest pain and palpitations.  Gastrointestinal:  Positive for diarrhea (reports one loose dark brown stool/day), nausea and vomiting. Negative for abdominal pain, blood in stool, heartburn and melena.  Genitourinary: Negative.   Musculoskeletal: Negative.   Skin:  Positive for rash (patient with chronic skin changes, worse at legs. Started in May '23 by his report. His PCP gave him no dx.).  Neurological:  Negative for dizziness, tremors, focal weakness and seizures.  Endo/Heme/Allergies: Negative.   Psychiatric/Behavioral:  Positive for substance  abuse. Negative for depression, hallucinations and suicidal ideas. The patient has insomnia.     Past Medical History:  Diagnosis Date   GERD (gastroesophageal reflux disease)    Substance abuse ([place])     Past Surgical History:  Procedure Laterality Date   JOINT REPLACEMENT     Soc Hx - 1st marriage 60 years-divorced; 2nde marriage 9 years. Two sons 24,26. He worked in Engineer, mining at [employer] and then worked at [employer] when it was a start up. 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 Recently moved to a retirement community.    reports that he quit smoking about 13 years ago. His smoking use included cigarettes. He has a 20.00 pack-year smoking history. He has never used smokeless tobacco. He reports that he does not currently use alcohol. He reports that he does not use drugs.  Allergies  Allergen Reactions   Chocolate Hives and Itching   Iodine Hives   Peanut Oil Hives and Itching   Penicillins Hives   Shellfish Allergy Hives    Family History  Problem Relation Age of Onset   Atrial fibrillation Sister     Prior to Admission medications   Medication Sig Start Date End Date Taking? Authorizing Provider  acetaminophen (TYLENOL) 500 MG tablet Take 1,000 mg by mouth every 8 (eight) hours.    [provider]  aspirin EC 325 MG tablet Take 1 tablet (325 mg total) by mouth daily. 06/04/21   Jennye Boroughs, MD  Camphor-Menthol-Methyl Sal 3.01-25-08 % PTCH Apply 1 patch topically daily at  6 (six) AM.    [provider]  carvedilol (COREG) 3.125 MG tablet Take 3.125 mg by mouth 2 (two) times daily.    [provider]  cetirizine (ZYRTEC) 10 MG tablet Take 10 mg by mouth daily.    [provider]  diphenhydrAMINE (BENADRYL) 25 mg capsule Take 25 mg by mouth every 6 (six) hours as needed for allergies or itching. 06/02/21   [provider]  ELIQUIS 5 MG TABS tablet Take 1 tablet (5 mg total) by mouth 2 (two) times  daily. 06/04/21   Lurene Shadow, MD  ergocalciferol (VITAMIN D2) 1.25 MG (50000 UT) capsule Take 1 capsule by mouth once a week. Given Every Wednesday 07/25/20   [provider]  esomeprazole (NEXIUM) 40 MG capsule Take 40 mg by mouth daily.    [provider]  ferrous sulfate 325 (65 FE) MG tablet Take 1 tablet (325 mg total) by mouth every other day. 06/04/21 08/03/21  Lurene Shadow, MD  Magnesium 200 MG TABS Take 400 mg by mouth daily at 6 (six) AM.    [provider]  methocarbamol (ROBAXIN) 500 MG tablet Take 500 mg by mouth 2 (two) times daily. For muscle spasms    [provider]  potassium chloride (KLOR-CON M) 10 MEQ tablet Take by mouth daily.    [provider]  pregabalin (LYRICA) 75 MG capsule Take by mouth 2 (two) times daily. 01/28/21 01/28/22  [provider]  rosuvastatin (CRESTOR) 20 MG tablet Take 20 mg by mouth daily.    [provider]  torsemide (DEMADEX) 20 MG tablet Take 20 mg by mouth daily. For edema and fluid retention 06/02/21   [provider]    Physical Exam: Vitals:   09/05/21 2145 09/05/21 2200 09/05/21 2215 09/05/21 2230  BP: 127/78 120/73 122/80 125/76  Pulse: 80 (!) 113 76 73  Resp:      Temp:      SpO2: 93% 92% 94% 93%  Weight:      Height:        Physical Exam Constitutional:      General: He is not in acute distress.    Appearance: Normal appearance. He is obese. He is not ill-appearing.     Comments: Disheveled appearance.  HENT:     Head: Normocephalic and atraumatic.     Mouth/Throat:     Mouth: Mucous membranes are moist.     Comments: Native dentition Eyes:     Extraocular Movements: Extraocular movements intact.     Pupils: Pupils are equal, round, and reactive to light.     Comments: Injected bulbar conjunctive OS lateral aspect  Cardiovascular:     Rate and Rhythm: Normal rate. Rhythm irregular.     Pulses: Normal pulses.     Heart sounds: Normal heart sounds.   Pulmonary:     Effort: Pulmonary effort is normal.     Breath sounds: Normal breath sounds. No wheezing or rales.  Abdominal:     General: Bowel sounds are normal. There is distension.     Palpations: Abdomen is soft. There is no mass.     Tenderness: There is no guarding.  Musculoskeletal:        General: Normal range of motion.     Cervical back: Normal range of motion and neck supple.     Right lower leg: Edema (trace pitting edema distal LE) present.     Left lower leg: Edema (trace pitting edema distal LE) present.  Skin:  General: Skin is warm and dry.     Comments: Distal LEs with very dry skin with erythema - chronic stasis changes. No open lesions.  Neurological:     General: No focal deficit present.     Mental Status: He is alert and oriented to person, place, and time.     Cranial Nerves: No cranial nerve deficit.  Psychiatric:        Mood and Affect: Mood normal.        Behavior: Behavior normal.     Comments: [place] indifference re: a fib, HF and substance abuse. Not able to relate medications accurately.       Labs on Admission: I have personally reviewed following labs and imaging studies  CBC: Recent Labs  Lab 09/05/21 2039  WBC 5.8  NEUTROABS 4.4  HGB 12.2*  HCT 38.2*  MCV 82.3  PLT 137*   Basic Metabolic Panel: Recent Labs  Lab 09/05/21 2039  NA 134*  K 4.4  CL 95*  CO2 21*  GLUCOSE 170*  BUN 18  CREATININE 1.30*  CALCIUM 9.0  MG 1.6*  PHOS 4.0   GFR: Estimated Creatinine Clearance: 56.6 mL/min (A) (by C-G formula based on SCr of 1.3 mg/dL (H)). Liver Function Tests: No results for input(s): "AST", "ALT", "ALKPHOS", "BILITOT", "PROT", "ALBUMIN" in the last 168 hours. No results for input(s): "LIPASE", "AMYLASE" in the last 168 hours. No results for input(s): "AMMONIA" in the last 168 hours. Coagulation Profile: No results for input(s): "INR", "PROTIME" in the last 168 hours. Cardiac Enzymes: No results for input(s): "CKTOTAL",  "CKMB", "CKMBINDEX", "TROPONINI" in the last 168 hours. BNP (last 3 results) No results for input(s): "PROBNP" in the last 8760 hours. HbA1C: No results for input(s): "HGBA1C" in the last 72 hours. CBG: No results for input(s): "GLUCAP" in the last 168 hours. Lipid Profile: No results for input(s): "CHOL", "HDL", "LDLCALC", "TRIG", "CHOLHDL", "LDLDIRECT" in the last 72 hours. Thyroid Function Tests: No results for input(s): "TSH", "T4TOTAL", "FREET4", "T3FREE", "THYROIDAB" in the last 72 hours. Anemia Panel: No results for input(s): "VITAMINB12", "FOLATE", "FERRITIN", "TIBC", "IRON", "RETICCTPCT" in the last 72 hours. Urine analysis:    Component Value Date/Time   COLORURINE AMBER (A) 11/28/2020 1325   APPEARANCEUR CLEAR (A) 11/28/2020 1325   LABSPEC 1.027 11/28/2020 1325   PHURINE 5.0 11/28/2020 1325   GLUCOSEU 50 (A) 11/28/2020 1325   HGBUR SMALL (A) 11/28/2020 1325   BILIRUBINUR NEGATIVE 11/28/2020 1325   KETONESUR 80 (A) 11/28/2020 1325   PROTEINUR 100 (A) 11/28/2020 1325   NITRITE NEGATIVE 11/28/2020 1325   LEUKOCYTESUR NEGATIVE 11/28/2020 1325    Radiological Exams on Admission: I have personally reviewed images No results found.  EKG: I have personally reviewed EKG: No  EKG on chart  Assessment/Plan Principal Problem:   Dehydration Active Problems:   Alcohol abuse   AMS (altered mental status)   GERD (gastroesophageal reflux disease)   Chronic systolic CHF (congestive heart failure) (HCC)   Atrial fibrillation, chronic (HCC)    Assessment and Plan: * Dehydration Patient with N/V at [facility], now with mild increase in Cr, increased Anion gap  Plan IV hydration: 1/2 NS @ 100 cc/hr  F/U Bmet in AM  Alcohol abuse Patient with h/o EtOH abuse. Per his son he drinks close to a 5th per day. Self admitted to [facility] for treatment as requested by his congregate living facility,  who has referred him to [facility] for detox and mgt of dehydation. This is his  second admission to [facility].   Plan CIWA protocol  Chronic systolic CHF (congestive heart failure) ([place]) Last ECHO 06/03/21 with EF 50-55%, mod LVH. Diastolic parameters could not be assessed. Currently on Carvediol and torsemide. Appears well compensated.  Plan Continue home medications.   GERD (gastroesophageal reflux disease) Chronic problem. No h/o esophagitis. States he is due for EGD. It has been two years. He is also concerned about active GI issues as cause for 5 days of N/V and loose stools. He reports that in May during admission for CVA he received two unit transfusion.  Plan Continue PPI  Zofran q 4 prn  For refractory symptoms - may need GI consult  Heme test stool, heme test emesis  AMS (altered mental status) Patient seems lucid yet does not recall his medications, cannot explain his complete history, does not understand chonic stasis changes to his legs.  Plan  if with treatment for dehydration and EtOH withdrawal he doesn't improve will need cognitive evaluation.  Atrial fibrillation, chronic (HCC) Stable heart rate.  Plan -  med -surg admit, no indication for telemetry  Continue home meds including eliquis         DVT prophylaxis: Eliquis Code Status: Full Code Family Communication: spoke with son Cristie [name] [place]  Disposition Plan: return to [facility] when stable  Consults called: none  Admission status: Inpatient, Med-Surg   Adella Hare, MD [facility] 09/06/2021, 12:31 AM

## 2021-09-05 NOTE — Subjective & Objective (Addendum)
Mr. Boeke, a 76 y/o with h/o chronic a. Fib on Eliquis, GERD, CVA, iron deficiency anemia and alcohol abuse. He was resding at [facility] in independent living. He reports a 5 day h/o N/V and loose stools, with virtually no PO intake for several days. He was admitted to [facility] for treatment at the direction of [facility] due to behavior change. On the day of admission, today, he was found to be dehydrated. By report, last drink was 24 hrs prior to admission and he was thought to need medical detox. Thus, he is referred to [facility] for evaluation and treatment.

## 2021-09-05 NOTE — ED Triage Notes (Addendum)
BIB [facility] from [facility] for medical detox from ETOH with c/o nausea & vomiting, decreased appetite. Ate lunch, no issues. BP 90s systolic later in the afternoon.   Got 0.5 mg ativan at 1805 from facility. Reports last drink was yesterday and usually drinks 3 drinks of vodka & water per day.

## 2021-09-05 NOTE — Assessment & Plan Note (Addendum)
Patient with h/o EtOH abuse. Self admitted to [facility] for treatment as requested by his congregate living facility,  who has referred him to [facility] for detox and mgt of dehydation. This is his second admission to [facility].   Plan CIWA protocol

## 2021-09-06 ENCOUNTER — Inpatient Hospital Stay (HOSPITAL_COMMUNITY): Payer: Medicare Other

## 2021-09-06 DIAGNOSIS — I5022 Chronic systolic (congestive) heart failure: Secondary | ICD-10-CM | POA: Diagnosis present

## 2021-09-06 DIAGNOSIS — E860 Dehydration: Secondary | ICD-10-CM | POA: Diagnosis not present

## 2021-09-06 LAB — CBG MONITORING, ED
Glucose-Capillary: 124 mg/dL — ABNORMAL HIGH (ref 70–99)
Glucose-Capillary: 203 mg/dL — ABNORMAL HIGH (ref 70–99)

## 2021-09-06 LAB — URINALYSIS, MICROSCOPIC (REFLEX)

## 2021-09-06 LAB — COMPREHENSIVE METABOLIC PANEL
ALT: 112 U/L — ABNORMAL HIGH (ref 0–44)
AST: 119 U/L — ABNORMAL HIGH (ref 15–41)
Albumin: 3.1 g/dL — ABNORMAL LOW (ref 3.5–5.0)
Alkaline Phosphatase: 56 U/L (ref 38–126)
Anion gap: 13 (ref 5–15)
BUN: 16 mg/dL (ref 8–23)
CO2: 24 mmol/L (ref 22–32)
Calcium: 8.7 mg/dL — ABNORMAL LOW (ref 8.9–10.3)
Chloride: 96 mmol/L — ABNORMAL LOW (ref 98–111)
Creatinine, Ser: 1.13 mg/dL (ref 0.61–1.24)
GFR, Estimated: >60 mL/min (ref >60)
Glucose, Bld: 124 mg/dL — ABNORMAL HIGH (ref 70–99)
Potassium: 3.9 mmol/L (ref 3.5–5.1)
Sodium: 133 mmol/L — ABNORMAL LOW (ref 135–145)
Total Bilirubin: 2.7 mg/dL — ABNORMAL HIGH (ref 0.3–1.2)
Total Protein: 5.5 g/dL — ABNORMAL LOW (ref 6.5–8.1)

## 2021-09-06 LAB — BETA-HYDROXYBUTYRIC ACID: Beta-Hydroxybutyric Acid: 5.66 mmol/L — ABNORMAL HIGH (ref 0.05–0.27)

## 2021-09-06 LAB — BASIC METABOLIC PANEL
Anion gap: 17 — ABNORMAL HIGH (ref 5–15)
BUN: 17 mg/dL (ref 8–23)
CO2: 21 mmol/L — ABNORMAL LOW (ref 22–32)
Calcium: 9 mg/dL (ref 8.9–10.3)
Chloride: 94 mmol/L — ABNORMAL LOW (ref 98–111)
Creatinine, Ser: 1.47 mg/dL — ABNORMAL HIGH (ref 0.61–1.24)
GFR, Estimated: 50 mL/min — ABNORMAL LOW (ref >60)
Glucose, Bld: 224 mg/dL — ABNORMAL HIGH (ref 70–99)
Potassium: 4.1 mmol/L (ref 3.5–5.1)
Sodium: 132 mmol/L — ABNORMAL LOW (ref 135–145)

## 2021-09-06 LAB — GLUCOSE, CAPILLARY
Glucose-Capillary: 162 mg/dL — ABNORMAL HIGH (ref 70–99)
Glucose-Capillary: 142 mg/dL — ABNORMAL HIGH (ref 70–99)

## 2021-09-06 LAB — CBC
HCT: 34.1 % — ABNORMAL LOW (ref 39.0–52.0)
Hemoglobin: 11 g/dL — ABNORMAL LOW (ref 13.0–17.0)
MCH: 26.3 pg (ref 26.0–34.0)
MCHC: 32.3 g/dL (ref 30.0–36.0)
MCV: 81.4 fL (ref 80.0–100.0)
Platelets: 112 10*3/uL — ABNORMAL LOW (ref 150–400)
RBC: 4.19 MIL/uL — ABNORMAL LOW (ref 4.22–5.81)
RDW: 19.4 % — ABNORMAL HIGH (ref 11.5–15.5)
WBC: 5.5 10*3/uL (ref 4.0–10.5)
nRBC: 0 % (ref 0.0–0.2)

## 2021-09-06 LAB — HEMOGLOBIN A1C
Hgb A1c MFr Bld: 5.8 % — ABNORMAL HIGH (ref 4.8–5.6)
Mean Plasma Glucose: 119.76 mg/dL

## 2021-09-06 LAB — URINALYSIS, ROUTINE W REFLEX MICROSCOPIC
Glucose, UA: NEGATIVE mg/dL
Ketones, ur: 40 mg/dL — AB
Nitrite: NEGATIVE
Protein, ur: NEGATIVE mg/dL
Specific Gravity, Urine: 1.02 (ref 1.005–1.030)
pH: 6 (ref 5.0–8.0)

## 2021-09-06 LAB — LACTIC ACID, PLASMA: Lactic Acid, Venous: 0.8 mmol/L (ref 0.5–1.9)

## 2021-09-06 LAB — BRAIN NATRIURETIC PEPTIDE: B Natriuretic Peptide: 171 pg/mL — ABNORMAL HIGH (ref 0.0–100.0)

## 2021-09-06 MED ORDER — THIAMINE HCL 100 MG/ML IJ SOLN
400.0000 mg | Freq: Three times a day (TID) | INTRAVENOUS | Status: AC
  Administered 2021-09-06 – 2021-09-09 (×9): 400 mg via INTRAVENOUS
  Filled 2021-09-06 (×15): qty 4

## 2021-09-06 MED ORDER — ONDANSETRON HCL 4 MG/2ML IJ SOLN
4.0000 mg | Freq: Four times a day (QID) | INTRAMUSCULAR | Status: DC | PRN
  Administered 2021-09-06: 4 mg via INTRAVENOUS
  Filled 2021-09-06: qty 2

## 2021-09-06 MED ORDER — INSULIN ASPART 100 UNIT/ML IJ SOLN
0.0000 [IU] | Freq: Three times a day (TID) | INTRAMUSCULAR | Status: DC
  Administered 2021-09-06 – 2021-09-07 (×3): 2 [IU] via SUBCUTANEOUS
  Administered 2021-09-08 (×2): 1 [IU] via SUBCUTANEOUS
  Administered 2021-09-08: 2 [IU] via SUBCUTANEOUS
  Administered 2021-09-09: 1 [IU] via SUBCUTANEOUS
  Administered 2021-09-09: 2 [IU] via SUBCUTANEOUS
  Administered 2021-09-10: 1 [IU] via SUBCUTANEOUS

## 2021-09-06 MED ORDER — SODIUM CHLORIDE 0.9 % IV SOLN
INTRAVENOUS | Status: DC
  Administered 2021-09-06: 1000 mL via INTRAVENOUS

## 2021-09-06 MED ORDER — MAGNESIUM SULFATE 2 GM/50ML IV SOLN
2.0000 g | Freq: Once | INTRAVENOUS | Status: AC
  Administered 2021-09-06: 2 g via INTRAVENOUS
  Filled 2021-09-06: qty 50

## 2021-09-06 NOTE — Hospital Course (Addendum)
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 By report, last drink was 24 hrs prior to admission and he was thought to need medical detox. Thus, he is referred to [facility] for evaluation and treatment.   In ED: afebrile, 125/76 HR 73  RR 19. Bmet with Cr 1.3 (baseline 1.06), bicarb 21 anion gap 18, magnesium 1.6 sodium 134 chloride 25, lactic acid 1.7, beta hydroxybutyrate acid 5.6 glucose 170.

## 2021-09-06 NOTE — ED Notes (Signed)
The secretary for the unit noticed the patient's SpO2 measured in the 70s. This EMT along with a RN went to check on the patient. After the patient was aroused, their SpO2 reading increased to 94. The RN along with this EMT placed the patient on two liters of oxygen via nasal cannula.The patient is resting with the stretcher locked and in the lowest position, with the call bell within reach.

## 2021-09-06 NOTE — ED Notes (Signed)
Med Surg  Dehydration

## 2021-09-06 NOTE — Progress Notes (Signed)
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 By report, last drink was 24 hrs prior to admission and he was thought to need medical detox. Thus, he is referred to [facility] for evaluation and treatment.   In ED: afebrile, 125/76 HR 73  RR 19. Bmet with Cr 1.3 (baseline 1.06), bicarb 21 anion gap 18, magnesium 1.6 sodium 134 chloride 25, lactic acid 1.7, beta hydroxybutyrate acid 5.6 glucose 170.     Subjective: Seen/examined He just woke up Says has been sleepy, [facility], oriented to day, month year, current president. Says he drinks vodka/liqor drinks last one on 09/04/21. Legs skin and mucosa appear dry Reports he has been having insomnia for few months   Assessment and Plan: Principal Problem:   Dehydration Active Problems:   Alcohol abuse   AMS (altered mental status)   GERD (gastroesophageal reflux disease)   Chronic systolic CHF (congestive heart failure) (HCC)   Atrial fibrillation, chronic (HCC)   Acute metabolic encephalopathy/question confusion: Currently mental status normal back to baseline, given his significant alcohol use I will start him on high-dose thiamine.  Continue to address the dehydration volume depletion, continue supportive  care PT OT evaluation.  No imaging obtained in the ED- get CT brain for completeness  Alcohol abuse hx At risk for withdrawal: Continue CIWA Ativan, folate thiamine multivitamins  AKI Dehydration Anion gap metabolic acidosis Hypochloremic hyponatremia mild: In the setting of volume depletion dehydration.  Continue aggressive IV hydration, repeating electrolytes lactic acid-to ensure bicarb/and other electrolytes stabilizing.  On torsemide on hold for now. Check bnp. Recent Labs  Lab 09/05/21 2039 09/06/21 0215 09/06/21 1033  BUN 18 17 16   CREATININE 1.30* 1.47* 1.13    Hypomagnesemia: Repleted  Mild hyperglycemia prediabetes with A1c 6.1 in May 2023,now at 5.8, add ssi. Recent Labs  Lab 09/06/21 1026 09/06/21 1033  GLUCAP 124*  --   HGBA1C  --  5.8*    Chronic atrial fibrillation History of previous stroke  in May: Continues Eliquis, Crestor Transaminitis: hold Crestor ,check acute hepatitis panel and RUQ ultrasound  Mild thrombocytopenia platelet 137 on admission. Monitor Recent Labs  Lab 09/05/21 2039 09/06/21 1033  PLT 137* 112*     DVT prophylaxis: Eliquis Code Status:   Code Status: Full Code Family Communication: plan of care discussed with patient at bedside. Patient status is: Inpatient because of ongoing management of dehydration alcohol withdrawals Level of care: Med-Surg   Dispo: The patient is from: Facility            Anticipated disposition: TBD, ptot consulted  Mobility Assessment (last 72 hours)     Mobility Assessment   No documentation.            Objective: Vitals last 24 hrs: Vitals:   09/06/21 0613 09/06/21 0914 09/06/21 1015 09/06/21 1036  BP:  103/70 107/78 112/71  Pulse:  62 (!) 56 71  Resp:  20    Temp: 98.2 F (36.8 C)     TempSrc: Oral     SpO2:  97% 97%   Weight:      Height:       Weight change:   Physical Examination: General exam: alert awake,older than stated age, weak appearing. HEENT:Oral mucosa moist,  Ear/Nose WNL grossly, dentition normal. Respiratory system: bilaterally diminished BS, no use of accessory muscle Cardiovascular system: S1 & S2 +, No JVD. Gastrointestinal system: Abdomen soft,NT,ND, BS+ Nervous System:Alert, awake, moving extremities and grossly nonfocal Extremities: LE edema  none,distal peripheral pulses palpable.  Skin: No rashes,no icterus. MSK: Normal muscle bulk,tone, power  Medications reviewed:  Scheduled Meds:  apixaban  5 mg Oral BID   carvedilol  3.125 mg Oral BID   folic acid  1 mg Oral Daily   insulin aspart  0-9 Units Subcutaneous TID WC   LORazepam  0-4 mg Oral Q6H   Followed by   Melene Muller ON 09/07/2021] LORazepam  0-4 mg Oral Q12H   methocarbamol  500 mg Oral BID   multivitamin with minerals  1 tablet Oral Daily   pantoprazole  80 mg Oral Q1200   potassium chloride  10 mEq Oral Daily   Continuous Infusions:  sodium chloride 150 mL/hr at 09/06/21 1030   magnesium sulfate bolus IVPB 2 g (09/06/21 1041)   thiamine (VITAMIN B1) injection        Diet Order             Diet regular Room service appropriate? Yes; Fluid consistency: Thin  Diet effective now                           No intake or output data in the 24 hours ending 09/06/21 1127 Net IO Since Admission: No IO data has been entered for this period [09/06/21 1127]  Wt Readings from Last 3 Encounters:  09/05/21 89.4 kg  06/03/21 100.9 kg  11/28/20 79.4 kg     Unresulted Labs (From admission, onward)     Start     Ordered   09/07/21 0500  Comprehensive metabolic panel  Daily at 5am,   R      09/06/21 1126   09/07/21 0500  CBC  Daily at 5am,   R      09/06/21 1126   09/07/21 0500  Hepatitis panel, acute  Tomorrow morning,   R        09/06/21 1126   09/06/21 1124  Brain natriuretic peptide  Add-on,   AD        09/06/21 1123   09/06/21 0928  Urinalysis, Routine w reflex microscopic Urine, Clean Catch  ONCE - URGENT,   URGENT        09/06/21 0927          Data  Reviewed: I have personally reviewed following labs and imaging studies CBC: Recent Labs  Lab 09/05/21 2039 09/06/21 1033  WBC 5.8 5.5  NEUTROABS 4.4  --   HGB 12.2* 11.0*  HCT 38.2* 34.1*  MCV 82.3 81.4  PLT 137* 112*   Basic Metabolic Panel: Recent Labs  Lab 09/05/21 2039 09/06/21 0215 09/06/21 1033  NA 134* 132* 133*  K 4.4 4.1 3.9  CL 95* 94* 96*  CO2 21* 21* 24  GLUCOSE 170* 224* 124*  BUN 18 17 16   CREATININE 1.30* 1.47* 1.13  CALCIUM 9.0 9.0 8.7*  MG  1.6*  --   --   PHOS 4.0  --   --    GFR: Estimated Creatinine Clearance: 65.1 mL/min (by C-G formula based on SCr of 1.13 mg/dL). Liver Function Tests: Recent Labs  Lab 09/06/21 1033  AST 119*  ALT 112*  ALKPHOS 56  BILITOT 2.7*  PROT 5.5*  ALBUMIN 3.1*   No results for input(s): "LIPASE", "AMYLASE" in the last 168 hours. No results for input(s): "AMMONIA" in the last 168 hours. Coagulation Profile: No results for input(s): "INR", "PROTIME" in the last 168 hours. BNP (last 3 results) No results for input(s): "PROBNP" in the last 8760 hours. HbA1C: Recent Labs    09/06/21 1033  HGBA1C 5.8*   CBG: Recent Labs  Lab 09/06/21 1026  GLUCAP 124*   Lipid Profile: No results for input(s): "CHOL", "HDL", "LDLCALC", "TRIG", "CHOLHDL", "LDLDIRECT" in the last 72 hours. Thyroid Function Tests: No results for input(s): "TSH", "T4TOTAL", "FREET4", "T3FREE", "THYROIDAB" in the last 72 hours. Sepsis Labs: Recent Labs  Lab 09/05/21 2251 09/06/21 1033  LATICACIDVEN 1.7 0.8    No results found for this or any previous visit (from the past 240 hour(s)).  Antimicrobials: Anti-infectives (From admission, onward)    None      Culture/Microbiology    Component Value Date/Time   SDES BLOOD LEFT ANTECUBITAL 11/28/2020 1325   SPECREQUEST  11/28/2020 1325    BOTTLES DRAWN AEROBIC AND ANAEROBIC Blood Culture adequate volume   CULT  11/28/2020 1325    NO GROWTH 5 DAYS Performed at [facility],  [address], [place], [place] [address]    REPTSTATUS 12/03/2020 FINAL 11/28/2020 1325    Radiology Studies: No results found.   LOS: 1 day   Antonieta Pert, MD [facility]  09/06/2021, 11:27 AM

## 2021-09-06 NOTE — Assessment & Plan Note (Signed)
Last ECHO 06/03/21 with EF 50-55%, mod LVH. Diastolic parameters could not be assessed. Currently on Carvediol and torsemide. Appears well compensated.  Plan Continue home medications.

## 2021-09-06 NOTE — H&P (Incomplete)
History and Physical    Christian Clark SHF:[id number] DOB: 1945-03-21 DOA: 09/05/2021  DOS: the patient was seen and examined on 09/05/2021  PCP: Lauro Regulus, MD   Patient [facility] from:  [name]  I have personally briefly reviewed patient's old medical records in [facility] Link  Christian Clark, a 76 y/o with h/o chronic a. Fib on Eliquis, GERD, CVA, iron deficiency anemia and alcohol abuse. He was resding at [facility] in independent living. He reports a 5 day h/o N/V and loose stools, with virtually no PO intake for several days. He was admitted to [facility] for treatment at the direction of [facility] due to behavior change. On the day of admission, today, he was found to be dehydrated. By report, last drink was 24 hrs prior to admission and he was thought to need medical detox. Thus, he is referred to [facility] for evaluation and treatment.    ED Course: afebrile, 125/76 HR 73  RR 19. Bmet with Cr 1.3 (baseline 1.06), CBCD nl. [facility] called to admit for further evaluation and treatment  Review of Systems:  Review of Systems  Constitutional:  Positive for weight loss. Negative for chills and fever.  HENT:  Negative for congestion, hearing loss and tinnitus.   Eyes: Negative.   Respiratory:  Negative for cough, sputum production and wheezing.   Cardiovascular:  Positive for leg swelling. Negative for chest pain and palpitations.  Gastrointestinal:  Positive for diarrhea (reports one loose dark brown stool/day), nausea and vomiting. Negative for abdominal pain, blood in stool, heartburn and melena.  Genitourinary: Negative.   Musculoskeletal: Negative.   Skin:  Positive for rash (patient with chronic skin changes, worse at legs. Started in May '23 by his report. His PCP gave him no dx.).  Neurological:  Negative for dizziness, tremors, focal weakness and seizures.  Endo/Heme/Allergies: Negative.   Psychiatric/Behavioral:  Positive for substance  abuse. Negative for depression, hallucinations and suicidal ideas. The patient has insomnia.     Past Medical History:  Diagnosis Date  . GERD (gastroesophageal reflux disease)   . Substance abuse [facility])     Past Surgical History:  Procedure Laterality Date  . JOINT REPLACEMENT     Soc Hx - 1st marriage 21 years-divorced; 2nde marriage 9 years. Two sons 24,26. He worked in Education officer, environmental at [employer] and then worked at [employer]    reports that he quit smoking about 13 years ago. His smoking use included cigarettes. He has a 20.00 pack-year smoking history. He has never used smokeless tobacco. He reports that he does not currently use alcohol. He reports that he does not use drugs.  Allergies  Allergen Reactions  . Chocolate Hives and Itching  . Iodine Hives  . Peanut Oil Hives and Itching  . Penicillins Hives  . Shellfish Allergy Hives    Family History  Problem Relation Age of Onset  . Atrial fibrillation Sister     Prior to Admission medications   Medication Sig Start Date End Date Taking? Authorizing Provider  acetaminophen (TYLENOL) 500 MG tablet Take 1,000 mg by mouth every 8 (eight) hours.    [provider]  aspirin EC 325 MG tablet Take 1 tablet (325 mg total) by mouth daily. 06/04/21   Lurene Shadow, MD  Camphor-Menthol-Methyl Sal 3.01-25-08 % [facility] Apply 1 patch topically daily at 6 (six) AM.    [provider]  carvedilol (COREG) 3.125 MG tablet Take 3.125 mg by mouth 2 (two) times daily.  [provider]  cetirizine (ZYRTEC) 10 MG tablet Take 10 mg by mouth daily.    [provider]  diphenhydrAMINE (BENADRYL) 25 mg capsule Take 25 mg by mouth every 6 (six) hours as needed for allergies or itching. 06/02/21   [provider]  ELIQUIS 5 MG TABS tablet Take 1 tablet (5 mg total) by mouth 2 (two) times daily. 06/04/21   Lurene Shadow, MD  ergocalciferol (VITAMIN D2) 1.25 MG (50000 UT) capsule Take 1 capsule by mouth once a week. Given Every  Wednesday 07/25/20   [provider]  esomeprazole (NEXIUM) 40 MG capsule Take 40 mg by mouth daily.    [provider]  ferrous sulfate 325 (65 FE) MG tablet Take 1 tablet (325 mg total) by mouth every other day. 06/04/21 08/03/21  Lurene Shadow, MD  Magnesium 200 MG TABS Take 400 mg by mouth daily at 6 (six) AM.    [provider]  methocarbamol (ROBAXIN) 500 MG tablet Take 500 mg by mouth 2 (two) times daily. For muscle spasms    [provider]  potassium chloride (KLOR-CON M) 10 MEQ tablet Take by mouth daily.    [provider]  pregabalin (LYRICA) 75 MG capsule Take by mouth 2 (two) times daily. 01/28/21 01/28/22  [provider]  rosuvastatin (CRESTOR) 20 MG tablet Take 20 mg by mouth daily.    [provider]  torsemide (DEMADEX) 20 MG tablet Take 20 mg by mouth daily. For edema and fluid retention 06/02/21   [provider]    Physical Exam: Vitals:   09/05/21 2145 09/05/21 2200 09/05/21 2215 09/05/21 2230  BP: 127/78 120/73 122/80 125/76  Pulse: 80 (!) 113 76 73  Resp:      Temp:      SpO2: 93% 92% 94% 93%  Weight:      Height:        Physical Exam   Labs on Admission: I have personally reviewed following labs and imaging studies  CBC: Recent Labs  Lab 09/05/21 2039  WBC 5.8  NEUTROABS 4.4  HGB 12.2*  HCT 38.2*  MCV 82.3  PLT 137*   Basic Metabolic Panel: Recent Labs  Lab 09/05/21 2039  NA 134*  K 4.4  CL 95*  CO2 21*  GLUCOSE 170*  BUN 18  CREATININE 1.30*  CALCIUM 9.0  MG 1.6*  PHOS 4.0   GFR: Estimated Creatinine Clearance: 56.6 mL/min (A) (by C-G formula based on SCr of 1.3 mg/dL (H)). Liver Function Tests: No results for input(s): "AST", "ALT", "ALKPHOS", "BILITOT", "PROT", "ALBUMIN" in the last 168 hours. No results for input(s): "LIPASE", "AMYLASE" in the last 168 hours. No results for input(s): "AMMONIA" in the last 168 hours. Coagulation Profile: No results for input(s):  "INR", "PROTIME" in the last 168 hours. Cardiac Enzymes: No results for input(s): "CKTOTAL", "CKMB", "CKMBINDEX", "TROPONINI" in the last 168 hours. BNP (last 3 results) No results for input(s): "PROBNP" in the last 8760 hours. HbA1C: No results for input(s): "HGBA1C" in the last 72 hours. CBG: No results for input(s): "GLUCAP" in the last 168 hours. Lipid Profile: No results for input(s): "CHOL", "HDL", "LDLCALC", "TRIG", "CHOLHDL", "LDLDIRECT" in the last 72 hours. Thyroid Function Tests: No results for input(s): "TSH", "T4TOTAL", "FREET4", "T3FREE", "THYROIDAB" in the last 72 hours. Anemia Panel: No results for input(s): "VITAMINB12", "FOLATE", "FERRITIN", "TIBC", "IRON", "RETICCTPCT" in the last 72 hours. Urine analysis:    Component Value Date/Time   COLORURINE AMBER (A) 11/28/2020 1325  APPEARANCEUR CLEAR (A) 11/28/2020 1325   LABSPEC 1.027 11/28/2020 1325   PHURINE 5.0 11/28/2020 1325   GLUCOSEU 50 (A) 11/28/2020 1325   HGBUR SMALL (A) 11/28/2020 1325   BILIRUBINUR NEGATIVE 11/28/2020 1325   KETONESUR 80 (A) 11/28/2020 1325   PROTEINUR 100 (A) 11/28/2020 1325   NITRITE NEGATIVE 11/28/2020 1325   LEUKOCYTESUR NEGATIVE 11/28/2020 1325    Radiological Exams on Admission: I have personally reviewed images No results found.  EKG: I have personally reviewed EKG: ***  Assessment/Plan Principal Problem:   Dehydration Active Problems:   Alcohol abuse   AMS (altered mental status)   GERD (gastroesophageal reflux disease)   Atrial fibrillation, chronic (HCC)    Assessment and Plan: * Dehydration Patient with N/V at [facility], now with mild increase in Cr, increased Anion gap  Plan IV hydration: 1/2 NS @ 100 cc/hr  F/U Bmet in AM  Alcohol abuse Patient with h/o EtOH abuse. Self admitted to [facility] for treatment as requested by his congregate living facility,  who has referred him to [facility] for detox and mgt of dehydation. This is his second admission to  [facility].   Plan CIWA protocol  GERD (gastroesophageal reflux disease) Chronic problem. No h/o esophagitis. States he is due for EGD. It has been two years. He is also concerned about active GI issues as cause for 5 days of N/V and loose stools. He reports that in May during admission for CVA he received two unit transfusion.  Plan Continue PPI  Zofran q 4 prn  For refractory symptoms - may need GI consult  Heme test stool, heme test emesis  AMS (altered mental status) Patient seems lucid yet does not recall his medications, cannot explain his complete history, does not understand chonic stasis changes to his legs.  Plan  if with treatment for dehydration and EtOH withdrawal he doesn't improve will need cognitive evaluation.  Atrial fibrillation, chronic (HCC) Stable heart rate.  Plan -  med -surg admit, no indication for telemetry  Continue home meds including eliquis         DVT prophylaxis: {Blank single:19197::"Lovenox","SQ Heparin","IV heparin gtts","Xarelto","Eliquis","Coumadin","SCDs","***"} Code Status: {Blank single:19197::"Full Code","DNR with Intubation","DNR/DNI(Do NOT Intubate)","Comfort Care","***"} Family Communication: ***  Disposition Plan: ***  Consults called: ***  Admission status: {Blank single:19197::"Observation","Inpatient"}, {Blank single:19197::"Med-Surg","Telemetry bed","Step Down Unit"}   Illene Regulus, MD [facility] 09/05/2021, 11:56 PM

## 2021-09-06 NOTE — ED Notes (Signed)
ED TO INPATIENT HANDOFF REPORT   S Name/Age/Gender Christian Clark 76 y.o. male Room/Bed: 002C/002C  Code Status   Code Status: Full Code  Home/SNF/Other Home Patient oriented to: self, place, time, and situation Is this baseline? 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 Ate lunch, no issues. BP 0000000 systolic later in the afternoon.   Got 0.5 mg ativan at 1805 from facility. Reports last drink was yesterday and usually drinks 3 drinks of vodka & water per day.     Allergies Allergies  Allergen Reactions   Chocolate Hives and Itching   Iodine Hives   Peanut Oil Hives and Itching   Penicillins Hives   Shellfish Allergy Hives    Level of Care/Admitting Diagnosis ED Disposition     ED Disposition  Admit   Condition  --   Comment  Hospital Area: [facility] [id number]  Level of Care: Med-Surg [16]  May admit patient to [facility] or [facility] if equivalent level of care is available:: Yes  Covid Evaluation: Asymptomatic - no recent exposure (last 10 days) testing not required  Diagnosis: Dehydration [276.51.ICD-9-CM]  Admitting Physician: Neena Rhymes [[id number]]  Attending Physician: Neena Rhymes [id number]  Certification:: I certify this patient will need inpatient services for at least 2 midnights  Estimated Length of Stay: 3          B Medical/Surgery History Past Medical History:  Diagnosis Date   GERD (gastroesophageal reflux disease)    Substance abuse ([place])    Past Surgical History:  Procedure Laterality Date   JOINT REPLACEMENT       A IV Location/Drains/Wounds Patient Lines/Drains/Airways Status     Active Line/Drains/Airways     Name Placement date Placement time Site Days   Peripheral IV 09/05/21 Right Antecubital 09/05/21  1959  Antecubital  1             Intake/Output Last 24 hours No intake or output data in the 24 hours ending 09/06/21 1254  Labs/Imaging Results for orders placed or performed during the hospital encounter of 09/05/21 (from the past 48 hour(s))  Basic metabolic panel     Status: Abnormal   Collection Time: 09/05/21  8:39 PM  Result Value Ref Range   Sodium 134 (L) 135 - 145 mmol/L   Potassium 4.4 3.5 - 5.1 mmol/L   Chloride 95 (L) 98 - 111 mmol/L   CO2 21 (L) 22 - 32 mmol/L   Glucose, Bld 170 (H) 70 - 99 mg/dL    Comment: Glucose reference range applies only to samples taken after fasting for at least 8 hours.   BUN 18 8 - 23 mg/dL   Creatinine, Ser 1.30 (H) 0.61 - 1.24 mg/dL   Calcium 9.0 8.9 - 10.3 mg/dL   GFR, Estimated 58 (L) >60 mL/min    Comment: (NOTE) Calculated using the CKD-EPI Creatinine Equation (2021)    Anion gap 18 (H) 5 - 15    Comment: Performed at [facility] [address] [place], [place] [address]  CBC with Differential     Status: Abnormal   Collection Time: 09/05/21  8:39 PM  Result Value Ref Range   WBC 5.8 4.0 - 10.5 K/uL   RBC 4.64 4.22 - 5.81 MIL/uL   Hemoglobin 12.2 (L) 13.0 - 17.0 g/dL   HCT 38.2 (  L) 39.0 - 52.0 %   MCV 82.3 80.0 - 100.0 fL   MCH 26.3 26.0 - 34.0 pg   MCHC 31.9 30.0 - 36.0 g/dL   RDW 19.3 (H) 11.5 - 15.5 %   Platelets 137 (L) 150 - 400 K/uL   nRBC 0.0 0.0 - 0.2 %   Neutrophils Relative % 74 %   Neutro Abs 4.4 1.7 - 7.7 K/uL   Lymphocytes Relative 15 %   Lymphs Abs 0.9 0.7 - 4.0 K/uL   Monocytes Relative 8 %   Monocytes Absolute 0.5 0.1 - 1.0 K/uL   Eosinophils Relative 1 %   Eosinophils Absolute 0.0 0.0 - 0.5 K/uL   Basophils Relative 1 %   Basophils Absolute 0.0 0.0 - 0.1 K/uL   Immature Granulocytes 1 %   Abs Immature Granulocytes 0.03 0.00 - 0.07 K/uL    Comment: Performed at [facility] [address] [place], [place] [address]  Beta-hydroxybutyric acid     Status: Abnormal   Collection Time: 09/05/21  8:39 PM  Result Value  Ref Range   Beta-Hydroxybutyric Acid 5.66 (H) 0.05 - 0.27 mmol/L    Comment: RESULT CONFIRMED BY MANUAL DILUTION Performed at [facility] [address], [place], [place] [address]   Magnesium     Status: Abnormal   Collection Time: 09/05/21  8:39 PM  Result Value Ref Range   Magnesium 1.6 (L) 1.7 - 2.4 mg/dL    Comment: Performed at [facility] [address] [place], [place] [address]  Phosphorus     Status: None   Collection Time: 09/05/21  8:39 PM  Result Value Ref Range   Phosphorus 4.0 2.5 - 4.6 mg/dL    Comment: Performed at [facility], [place] [address] [place], [place] [address]  Lactic acid, plasma     Status: None   Collection Time: 09/05/21 10:51 PM  Result Value Ref Range   Lactic Acid, Venous 1.7 0.5 - 1.9 mmol/L    Comment: Performed at [facility] [address] [place], [place] [id number]  Basic metabolic panel     Status: Abnormal   Collection Time: 09/06/21  2:15 AM  Result Value Ref Range   Sodium 132 (L) 135 - 145 mmol/L   Potassium 4.1 3.5 - 5.1 mmol/L   Chloride 94 (L) 98 - 111 mmol/L   CO2 21 (L) 22 - 32 mmol/L   Glucose, Bld 224 (H) 70 - 99 mg/dL    Comment: Glucose reference range applies only to samples taken after fasting for at least 8 hours.   BUN 17 8 - 23 mg/dL   Creatinine, Ser 1.47 (H) 0.61 - 1.24 mg/dL   Calcium 9.0 8.9 - 10.3 mg/dL   GFR, Estimated 50 (L) >60 mL/min    Comment: (NOTE) Calculated using the CKD-EPI Creatinine Equation (2021)    Anion gap 17 (H) 5 - 15    Comment: Performed at [facility] [address], [place], [place] [address]  CBG monitoring, ED     Status: Abnormal   Collection Time: 09/06/21 10:26 AM  Result Value Ref Range   Glucose-Capillary 124 (H) 70 - 99 mg/dL    Comment: Glucose reference range applies only to samples taken after fasting for at least 8 hours.  Lactic acid, plasma     Status: None   Collection Time: 09/06/21 10:33 AM  Result Value Ref Range   Lactic Acid,  Venous  0.8 0.5 - 1.9 mmol/L    Comment: Performed at [facility], [address], [place], [place] [address]  Hemoglobin A1c     Status: Abnormal   Collection Time: 09/06/21 10:33 AM  Result Value Ref Range   Hgb A1c MFr Bld 5.8 (H) 4.8 - 5.6 %    Comment: (NOTE) Pre diabetes:          5.7%-6.4%  Diabetes:              >6.4%  Glycemic control for   <7.0% adults with diabetes    Mean Plasma Glucose 119.76 mg/dL    Comment: Performed at [facility], [address] [place], [place] [address]  Comprehensive metabolic panel     Status: Abnormal   Collection Time: 09/06/21 10:33 AM  Result Value Ref Range   Sodium 133 (L) 135 - 145 mmol/L   Potassium 3.9 3.5 - 5.1 mmol/L   Chloride 96 (L) 98 - 111 mmol/L   CO2 24 22 - 32 mmol/L   Glucose, Bld 124 (H) 70 - 99 mg/dL    Comment: Glucose reference range applies only to samples taken after fasting for at least 8 hours.   BUN 16 8 - 23 mg/dL   Creatinine, Ser 1.51 0.61 - 1.24 mg/dL   Calcium 8.7 (L) 8.9 - 10.3 mg/dL   Total Protein 5.5 (L) 6.5 - 8.1 g/dL   Albumin 3.1 (L) 3.5 - 5.0 g/dL   AST 761 (H) 15 - 41 U/L   ALT 112 (H) 0 - 44 U/L   Alkaline Phosphatase 56 38 - 126 U/L   Total Bilirubin 2.7 (H) 0.3 - 1.2 mg/dL   GFR, Estimated >60 >73 mL/min    Comment: (NOTE) Calculated using the CKD-EPI Creatinine Equation (2021)    Anion gap 13 5 - 15    Comment: Performed at [facility], [address], [place], [place] [address]  CBC     Status: Abnormal   Collection Time: 09/06/21 10:33 AM  Result Value Ref Range   WBC 5.5 4.0 - 10.5 K/uL   RBC 4.19 (L) 4.22 - 5.81 MIL/uL   Hemoglobin 11.0 (L) 13.0 - 17.0 g/dL   HCT 69.4 (L) 85.4 - 62.7 %   MCV 81.4 80.0 - 100.0 fL   MCH 26.3 26.0 - 34.0 pg   MCHC 32.3 30.0 - 36.0 g/dL   RDW 03.5 (H) 00.9 - 38.1 %   Platelets 112 (L) 150 - 400 K/uL    Comment: REPEATED TO VERIFY   nRBC 0.0 0.0 - 0.2 %    Comment: Performed at [facility], [address]  [place], [place] [address]  CBG monitoring, ED     Status: Abnormal   Collection Time: 09/06/21 11:39 AM  Result Value Ref Range   Glucose-Capillary 203 (H) 70 - 99 mg/dL    Comment: Glucose reference range applies only to samples taken after fasting for at least 8 hours.  Urinalysis, Routine w reflex microscopic Urine, Clean Catch     Status: Abnormal   Collection Time: 09/06/21 11:56 AM  Result Value Ref Range   Color, Urine YELLOW YELLOW   APPearance CLEAR CLEAR   Specific Gravity, Urine 1.020 1.005 - 1.030   pH 6.0 5.0 - 8.0   Glucose, UA NEGATIVE NEGATIVE mg/dL   Hgb urine dipstick TRACE (A) NEGATIVE   Bilirubin Urine MODERATE (A) NEGATIVE   Ketones, ur 40 (A) NEGATIVE mg/dL  Protein, ur NEGATIVE NEGATIVE mg/dL   Nitrite NEGATIVE NEGATIVE   Leukocytes,Ua TRACE (A) NEGATIVE    Comment: Performed at [facility], [place] [address] [place], [place] [address]  Urinalysis, Microscopic (reflex)     Status: Abnormal   Collection Time: 09/06/21 11:56 AM  Result Value Ref Range   RBC / HPF 0-5 0 - 5 RBC/hpf   WBC, UA 0-5 0 - 5 WBC/hpf   Bacteria, UA RARE (A) NONE SEEN   Squamous Epithelial / LPF 0-5 0 - 5    Comment: Performed at [facility], [place] [address] [place], [place] [address]   US Abdomen Limited RUQ (LIVER/GB)  Result Date: 09/06/2021 CLINICAL DATA:  Abnormal liver function test. EXAM: ULTRASOUND ABDOMEN LIMITED RIGHT UPPER QUADRANT COMPARISON:  None Available. FINDINGS: Gallbladder: Not visualized, presumably decompressed. Common bile duct: Diameter: 4 mm Liver: Diffuse increase in parenchymal echogenicity. Overall normal size. No visualized mass. Exam limited by poor penetration of the liver by the sound beam. Portal vein is patent on color Doppler imaging with normal direction of blood flow towards the liver. Other: None. IMPRESSION: 1. No acute findings. 2. Gallbladder not visualized, likely decompressed. 3. Diffuse increased liver parenchymal echogenicity consistent  with hepatic steatosis. Electronically Signed   By: Lajean Manes M.D.   On: 09/06/2021 12:09    Pending Labs Unresulted Labs (From admission, onward)     Start     Ordered   09/07/21 0500  Comprehensive metabolic panel  Daily at 5am,   R      09/06/21 1126   09/07/21 0500  CBC  Daily at 5am,   R      09/06/21 1126   09/07/21 0500  Hepatitis panel, acute  Tomorrow morning,   R        09/06/21 1126   09/06/21 1124  Brain natriuretic peptide  Add-on,   AD        09/06/21 1123            Vitals/Pain Today's Vitals   09/06/21 1036 09/06/21 1045 09/06/21 1109 09/06/21 1135  BP: 112/71 119/78    Pulse: 71 75    Resp:      Temp:    98 F (36.7 C)  TempSrc:    Oral  SpO2:  97%    Weight:      Height:      PainSc:   0-No pain     Isolation Precautions No active isolations  Medications Medications  acetaminophen (TYLENOL) tablet 1,000 mg (has no administration in time range)  carvedilol (COREG) tablet 3.125 mg (3.125 mg Oral Given 09/06/21 1036)  pantoprazole (PROTONIX) EC tablet 80 mg (has no administration in time range)  apixaban (ELIQUIS) tablet 5 mg (5 mg Oral Given 09/06/21 1037)  methocarbamol (ROBAXIN) tablet 500 mg (500 mg Oral Given 09/06/21 1036)  potassium chloride (KLOR-CON M) CR tablet 10 mEq (10 mEq Oral Given 09/06/21 1037)  LORazepam (ATIVAN) tablet 1-4 mg (has no administration in time range)    Or  LORazepam (ATIVAN) injection 1-4 mg (has no administration in time range)  multivitamin with minerals tablet 1 tablet (1 tablet Oral Given 0000000 [id number])  folic acid (FOLVITE) tablet 1 mg (1 mg Oral Given 09/06/21 1036)  LORazepam (ATIVAN) tablet 0-4 mg ( Oral Not Given 09/06/21 0431)    Followed by  LORazepam (ATIVAN) tablet 0-4 mg (has no administration in time range)  0.9 %  sodium chloride infusion ( Intravenous Rate/Dose Verify 09/06/21  1126)  insulin aspart (novoLOG) injection 0-9 Units (100 Units Subcutaneous Not Given 09/06/21 1043)  thiamine (VITAMIN B1)  400 mg in sodium chloride 0.9 % 50 mL IVPB (has no administration in time range)  sodium chloride 0.9 % bolus 500 mL (0 mLs Intravenous Stopped 09/05/21 2252)  thiamine (VITAMIN B1) injection 100 mg (100 mg Intravenous Given 09/05/21 2200)  magnesium sulfate IVPB 2 g 50 mL (2 g Intravenous New Bag/Given 09/06/21 1041)    Mobility walks with device Moderate fall risk     R Recommendations: See Admitting Provider Note

## 2021-09-07 DIAGNOSIS — E860 Dehydration: Secondary | ICD-10-CM | POA: Diagnosis not present

## 2021-09-07 LAB — COMPREHENSIVE METABOLIC PANEL
ALT: 89 U/L — ABNORMAL HIGH (ref 0–44)
AST: 84 U/L — ABNORMAL HIGH (ref 15–41)
Albumin: 2.9 g/dL — ABNORMAL LOW (ref 3.5–5.0)
Alkaline Phosphatase: 51 U/L (ref 38–126)
Anion gap: 13 (ref 5–15)
BUN: 15 mg/dL (ref 8–23)
CO2: 26 mmol/L (ref 22–32)
Calcium: 8.5 mg/dL — ABNORMAL LOW (ref 8.9–10.3)
Chloride: 94 mmol/L — ABNORMAL LOW (ref 98–111)
Creatinine, Ser: 1.3 mg/dL — ABNORMAL HIGH (ref 0.61–1.24)
GFR, Estimated: 58 mL/min — ABNORMAL LOW (ref >60)
Glucose, Bld: 130 mg/dL — ABNORMAL HIGH (ref 70–99)
Potassium: 3.7 mmol/L (ref 3.5–5.1)
Sodium: 133 mmol/L — ABNORMAL LOW (ref 135–145)
Total Bilirubin: 1.9 mg/dL — ABNORMAL HIGH (ref 0.3–1.2)
Total Protein: 5.2 g/dL — ABNORMAL LOW (ref 6.5–8.1)

## 2021-09-07 LAB — HEPATITIS PANEL, ACUTE
HCV Ab: NONREACTIVE
Hep A IgM: NONREACTIVE
Hep B C IgM: REACTIVE — AB
Hepatitis B Surface Ag: NONREACTIVE

## 2021-09-07 LAB — GLUCOSE, CAPILLARY
Glucose-Capillary: 195 mg/dL — ABNORMAL HIGH (ref 70–99)
Glucose-Capillary: 173 mg/dL — ABNORMAL HIGH (ref 70–99)
Glucose-Capillary: 116 mg/dL — ABNORMAL HIGH (ref 70–99)
Glucose-Capillary: 159 mg/dL — ABNORMAL HIGH (ref 70–99)

## 2021-09-07 LAB — CBC
HCT: 32 % — ABNORMAL LOW (ref 39.0–52.0)
Hemoglobin: 10.4 g/dL — ABNORMAL LOW (ref 13.0–17.0)
MCH: 26.5 pg (ref 26.0–34.0)
MCHC: 32.5 g/dL (ref 30.0–36.0)
MCV: 81.4 fL (ref 80.0–100.0)
Platelets: 110 10*3/uL — ABNORMAL LOW (ref 150–400)
RBC: 3.93 MIL/uL — ABNORMAL LOW (ref 4.22–5.81)
RDW: 19.3 % — ABNORMAL HIGH (ref 11.5–15.5)
WBC: 5 10*3/uL (ref 4.0–10.5)
nRBC: 0 % (ref 0.0–0.2)

## 2021-09-07 NOTE — Progress Notes (Signed)
Mobility Specialist Progress Note:   09/07/21 1545  Mobility  Activity Ambulated with assistance in hallway  Level of Assistance Standby assist, set-up cues, supervision of patient - no hands on  Assistive Device Front wheel walker  Activity Response Tolerated well  $Mobility charge 1 Mobility   Pt agreeable to mobility session. No physical assist required during ambulation. Pt with slow, steady gait throughout. Back in bed with RN present.   Addison Lank Acute Rehab Secure Chat or Office Phone: [phone]

## 2021-09-07 NOTE — Evaluation (Signed)
Physical Therapy Evaluation Patient Details Name: Christian Clark MRN: [id number] DOB: [birth year] Today's Date: 09/07/2021  History of Present Illness  Pt is a 76 y.o. M who presents from [facility] on 09/05/2021 due to behavior change. Found to have AKI secondary to dehydration and abnormal labs. 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  Clinical Impression  PTA, pt lives at [facility], uses a cane vs RW for ambulation and is independent with ADL's. Pt reports 1 fall in past 6 months. Pt is motivated to participate in physical therapy evaluation. Ambulating 100 ft with a walker at a min guard assist level. Displays shuffling gait pattern, with decreased bilateral foot clearance and stride length. Would benefit from follow up PT for balance training and strengthening.      Recommendations for follow up therapy are one component of a multi-disciplinary discharge planning process, led by the attending physician.  Recommendations may be updated based on patient status, additional functional criteria and insurance authorization.  Follow Up Recommendations Outpatient PT (at [facility])      Assistance Recommended at Discharge PRN  Patient can return home with the following  Assistance with cooking/housework;Assist for transportation    Equipment Recommendations None recommended by PT  Recommendations for Other Services       Functional Status Assessment Patient has had a recent decline in their functional status and demonstrates the ability to make significant improvements in function in a reasonable and predictable amount of time.     Precautions / Restrictions Precautions Precautions: Fall Restrictions Weight Bearing Restrictions: No      Mobility  Bed Mobility Overal bed mobility: Needs Assistance Bed Mobility: Sit to Supine       Sit to supine: Min assist   General bed mobility comments: MinA for LE negotiation back into bed     Transfers Overall transfer level: Needs assistance Equipment used: Rolling walker (2 wheels) Transfers: Sit to/from Stand Sit to Stand: Supervision           General transfer comment: supervision for safety    Ambulation/Gait Ambulation/Gait assistance: Supervision Gait Distance (Feet): 100 Feet Assistive device: Rolling walker (2 wheels) Gait Pattern/deviations: Step-through pattern, Decreased stride length, Decreased dorsiflexion - right, Decreased dorsiflexion - left, Shuffle Gait velocity: decreased Gait velocity interpretation: <1.31 ft/sec, indicative of household ambulator   General Gait Details: Shuffling gait pattern, cues for increased stride length but pt unable to significantly correct. Cues for environmental navigation  Stairs            Wheelchair Mobility    Modified Rankin (Stroke Patients Only)       Balance Overall balance assessment: Needs assistance Sitting-balance support: Feet supported Sitting balance-[name]: Good     Standing balance support: Bilateral upper extremity supported Standing balance-[name]: Poor Standing balance comment: reliant on RW                             Pertinent Vitals/Pain Pain Assessment Pain Assessment: No/denies pain    Home Living Family/patient expects to be discharged to:: Private residence Living Arrangements: Alone Available Help at Discharge: Friend(s) Type of Home: Independent living facility           Home Equipment: Agricultural consultant (2 wheels);Cane - single point Additional Comments: [facility]    Prior Function Prior Level of Function : Independent/Modified Independent             Mobility Comments: using  cane vs RW, reports 1 fall in past 6 months ADLs Comments: ind ADL's     Hand Dominance   Dominant Hand: Right    Extremity/Trunk Assessment   Upper Extremity Assessment Upper Extremity Assessment: Defer to OT evaluation    Lower Extremity  Assessment Lower Extremity Assessment: Generalized weakness    Cervical / Trunk Assessment Cervical / Trunk Assessment: Kyphotic  Communication   Communication: No difficulties  Cognition Arousal/Alertness: Awake/alert Behavior During Therapy: WFL for tasks assessed/performed Overall Cognitive Status: Within Functional Limits for tasks assessed                                          General Comments      Exercises     Assessment/Plan    PT Assessment Patient needs continued PT services  PT Problem List Decreased strength;Decreased activity tolerance;Decreased balance;Decreased mobility       PT Treatment Interventions DME instruction;Gait training;Functional mobility training;Therapeutic activities;Therapeutic exercise;Balance training;Patient/family education    PT Goals (Current goals can be found in the Care Plan section)  Acute Rehab PT Goals Patient Stated Goal: to get stronger PT Goal Formulation: With patient Time For Goal Achievement: 09/21/21 Potential to Achieve Goals: Good    Frequency Min 3X/week     Co-evaluation               AM-PAC PT "6 Clicks" Mobility  Outcome Measure Help needed turning from your back to your side while in a flat bed without using bedrails?: None Help needed moving from lying on your back to sitting on the side of a flat bed without using bedrails?: A Little Help needed moving to and from a bed to a chair (including a wheelchair)?: A Little Help needed standing up from a chair using your arms (e.g., wheelchair or bedside chair)?: A Little Help needed to walk in hospital room?: A Little Help needed climbing 3-5 steps with a railing? : A Little 6 Click Score: 19    End of Session   Activity Tolerance: Patient tolerated treatment well Patient left: in bed;with call bell/phone within reach;Other (comment) (per pt request) Nurse Communication: Mobility status PT Visit Diagnosis: Unsteadiness on feet  (R26.81);Other abnormalities of gait and mobility (R26.89)    Time: 8588-5027 PT Time Calculation (min) (ACUTE ONLY): 30 min   Charges:   PT Evaluation $PT Eval Moderate Complexity: 1 Mod PT Treatments $Gait Training: 8-22 mins        Lillia Pauls, PT, DPT Acute Rehabilitation Services Office [phone]   Norval Morton 09/07/2021, 9:45 AM

## 2021-09-07 NOTE — Progress Notes (Signed)
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 By report, last drink was 24 hrs prior to admission and he was thought to need medical detox. Thus, he is referred to [facility] for evaluation and treatment.   In ED: afebrile, 125/76 HR 73  RR 19. Bmet with Cr 1.3 (baseline 1.06), bicarb 21 anion gap 18, magnesium 1.6 sodium 134 chloride 25, lactic acid 1.7, beta hydroxybutyrate acid 5.6 glucose 170.     Subjective: Seen and examined. Resting comfortably having his meal feels whole lot better today Overnight no fever, saturating well on room air Labs shows creatinine slightly up 1.3, LFTs downtrending bicarb and anion gap normalized CBC with thrombocytopenia and anemia   Assessment and Plan: Principal Problem:   Dehydration Active Problems:   Alcohol abuse   AMS (altered mental status)   GERD (gastroesophageal reflux disease)   Chronic systolic CHF (congestive heart failure) (HCC)   Atrial fibrillation, chronic (HCC)   Acute metabolic encephalopathy/question confusion: Mental status improved since admission. given his significant alcohol use started him on high-dose thiamine.  Continue to address the dehydration volume depletion w/ IVF.CT head- previous stroke.  PT OT  ordered for disposition,    Alcohol abuse hx At risk for withdrawal: Continue CIWA Ativan, folate thiamine multivitamins and etoh cessation, plan [facility]  AKI Dehydration Anion gap metabolic acidosis Hypochloremic hyponatremia mild: In the setting of volume depletion/dehydration.  Sodium improved, bicarb and anion gap has normalized creatinine still up 1.3- cont on ivf, cont to hold torsemide. Bnp up slightly at 171.  Creatinine was 1.2 back in May and  previously 1.0 in 2022. Recent Labs  Lab 09/05/21 2039 09/06/21 0215 09/06/21 1033 09/07/21 0202  BUN 18 17 16 15   CREATININE 1.30* 1.47* 1.13 1.30*     Hypomagnesemia: Repleted.  Mild hyperglycemia prediabetes with A1c 6.1 in May 2023,now at 5.8, cont ssi. Recent Labs  Lab 09/06/21 1026 09/06/21 1033 09/06/21 1139 09/06/21 1727 09/06/21 2102 09/07/21 0800  GLUCAP 124*  --  203* 162* 142* 195*  HGBA1C  --  5.8*  --   --   --   --      Chronic atrial fibrillation History of previous stroke  in May: HR stable cont his Eliquis, Crestor  Transaminitis Hepatic steatosis in RUQ [place]: hold Crestor,check acute hepatitis panel. Lfts improving  Mild thrombocytopenia platelet 137>110.Monitor Recent Labs  Lab 09/05/21 2039 09/06/21 1033 09/07/21 0202  PLT 137* 112* 110*     DVT prophylaxis: Eliquis Code Status:   Code Status: Full Code Family Communication: plan of care discussed with patient at bedside. Patient status is: Inpatient because of ongoing management of dehydration alcohol withdrawals Level of care: Med-Surg   Dispo: The patient is from: [facility]            Anticipated disposition: TBD, ptot consulted  for dispo.  Anticipate discharge tomorrow  Mobility Assessment (last 72 hours)     Mobility Assessment     Row Name 09/06/21 2100 09/06/21 1640         Does patient have an order for bedrest or is patient medically unstable No - Continue assessment No -  Continue assessment      What is the highest level of mobility based on the progressive mobility assessment? Level 5 (Walks with assist in room/hall) - Balance while stepping forward/back and can walk in room with assist - Complete --                Objective: Vitals last 24 hrs: Vitals:   09/06/21 2101 09/07/21 0000 09/07/21 0555 09/07/21 0759  BP: 96/71 102/62 109/71 121/66  Pulse: 62 64 70 63  Resp:   16 18  Temp: 98.4 F (36.9 C)   97.8 F (36.6 C)  TempSrc: Oral   Oral  SpO2: 98%  96% 97%  Weight:      Height:       Weight change:   Physical Examination: General exam: AAOX3,older than stated age, weak appearing. HEENT:Oral mucosa dry, Ear/Nose WNL grossly, dentition normal. Respiratory system: bilaterally CLEAR, no use of accessory muscle Cardiovascular system: S1 & S2 +, No JVD,. Gastrointestinal system: Abdomen soft,NT,ND,BS+ Nervous System:Alert, awake, moving extremities and grossly nonfocal Extremities: LE ankle edema NEG, distal peripheral pulses palpable.  Skin: No rashes,no icterus. MSK: Normal muscle bulk,tone, power   Medications reviewed:  Scheduled Meds:  apixaban  5 mg Oral BID   carvedilol  3.125 mg Oral BID   folic acid  1 mg Oral Daily   insulin aspart  0-9 Units Subcutaneous TID WC   LORazepam  0-4 mg Oral Q6H   Followed by   LORazepam  0-4 mg Oral Q12H   methocarbamol  500 mg Oral BID   multivitamin with minerals  1 tablet Oral Daily   pantoprazole  80 mg Oral Q1200   potassium chloride  10 mEq Oral Daily   Continuous Infusions:  sodium chloride 1,000 mL (09/06/21 1925)   thiamine (VITAMIN B1) injection 400 mg (09/07/21 0701)      Diet Order             Diet regular Room service appropriate? Yes; Fluid consistency: Thin  Diet effective now                            Intake/Output Summary (Last 24 hours) at 09/07/2021 0906 Last data filed at 09/07/2021 0300 Gross per 24 hour  Intake 1065.37 ml  Output 1700 ml  Net  -634.63 ml   Net IO Since Admission: -634.63 mL [09/07/21 0906]  Wt Readings from Last 3 Encounters:  09/05/21 89.4 kg  06/03/21 100.9 kg  11/28/20 79.4 kg     Unresulted Labs (From admission, onward)     Start     Ordered   09/07/21 0500  Comprehensive metabolic panel  Daily at 5am,   R      09/06/21 1126   09/07/21 0500  CBC  Daily at 5am,   R      09/06/21 1126          Data Reviewed: I have personally reviewed following labs and imaging studies CBC: Recent Labs  Lab 09/05/21 2039 09/06/21 1033 09/07/21 0202  WBC 5.8 5.5 5.0  NEUTROABS 4.4  --   --   HGB 12.2* 11.0*  10.4*  HCT 38.2* 34.1* 32.0*  MCV 82.3 81.4 81.4  PLT 137* 112* 110*    Basic Metabolic Panel: Recent Labs  Lab 09/05/21 2039 09/06/21 0215 09/06/21 1033 09/07/21 0202  NA 134* 132* 133* 133*  K 4.4 4.1 3.9 3.7  CL 95* 94* 96* 94*  CO2 21* 21* 24 26  GLUCOSE 170* 224* 124* 130*  BUN 18 17 16 15   CREATININE 1.30* 1.47* 1.13 1.30*  CALCIUM 9.0 9.0 8.7* 8.5*  MG 1.6*  --   --   --   PHOS 4.0  --   --   --     GFR: Estimated Creatinine Clearance: 56.6 mL/min (A) (by C-G formula based on SCr of 1.3 mg/dL (H)). Liver Function Tests: Recent Labs  Lab 09/06/21 1033 09/07/21 0202  AST 119* 84*  ALT 112* 89*  ALKPHOS 56 51  BILITOT 2.7* 1.9*  PROT 5.5* 5.2*  ALBUMIN 3.1* 2.9*    No results for input(s): "LIPASE", "AMYLASE" in the last 168 hours. No results for input(s): "AMMONIA" in the last 168 hours. Coagulation Profile: No results for input(s): "INR", "PROTIME" in the last 168 hours. BNP (last 3 results) No results for input(s): "PROBNP" in the last 8760 hours. HbA1C: Recent Labs    09/06/21 1033  HGBA1C 5.8*    CBG: Recent Labs  Lab 09/06/21 1026 09/06/21 1139 09/06/21 1727 09/06/21 2102 09/07/21 0800  GLUCAP 124* 203* 162* 142* 195*    Lipid Profile: No results for input(s): "CHOL", "HDL", "LDLCALC", "TRIG", "CHOLHDL", "LDLDIRECT" in the last 72 hours. Thyroid  Function Tests: No results for input(s): "TSH", "T4TOTAL", "FREET4", "T3FREE", "THYROIDAB" in the last 72 hours. Sepsis Labs: Recent Labs  Lab 09/05/21 2251 09/06/21 1033  LATICACIDVEN 1.7 0.8     No results found for this or any previous visit (from the past 240 hour(s)).  Antimicrobials: Anti-infectives (From admission, onward)    None      Culture/Microbiology    Component Value Date/Time   SDES BLOOD LEFT ANTECUBITAL 11/28/2020 1325   SPECREQUEST  11/28/2020 1325    BOTTLES DRAWN AEROBIC AND ANAEROBIC Blood Culture adequate volume   CULT  11/28/2020 1325    NO GROWTH 5 DAYS Performed at [facility], [address] [place], [place] [address]    REPTSTATUS 12/03/2020 FINAL 11/28/2020 1325    Radiology Studies: CT HEAD WO CONTRAST (5MM)  Result Date: 09/06/2021 CLINICAL DATA:  Delirium, confusion EXAM: CT HEAD WITHOUT CONTRAST TECHNIQUE: Contiguous axial images were obtained from the base of the skull through the vertex without intravenous contrast. RADIATION DOSE REDUCTION: This exam was performed according to the departmental dose-optimization program which includes automated exposure control, adjustment of the mA and/or kV according to patient size and/or use of iterative reconstruction technique. COMPARISON:  06/03/2021 FINDINGS: Brain: No evidence of acute infarction, hemorrhage, extra-axial collection, ventriculomegaly, or mass effect. Bilateral frontal lobe and right parietal lobe encephalomalacia from prior infarcts. Generalized cerebral atrophy. Periventricular white matter low attenuation likely secondary to microangiopathy. Vascular: Cerebrovascular atherosclerotic calcifications are noted. Skull: Negative for fracture or focal lesion. Sinuses/Orbits: Visualized portions of the orbits are unremarkable. Visualized portions of the paranasal sinuses are unremarkable. Visualized portions of the mastoid air cells are unremarkable. Other: None. IMPRESSION: 1. No  acute intracranial findings. 2. Bilateral frontal lobe and right parietal lobe encephalomalacia from prior infarcts. 3. Chronic small vessel ischemic changes. Electronically Signed   By: Kathreen Devoid M.D.   On: 09/06/2021 12:54   US Abdomen Limited RUQ (  LIVER/GB)  Result Date: 09/06/2021 CLINICAL DATA:  Abnormal liver function test. EXAM: ULTRASOUND ABDOMEN LIMITED RIGHT UPPER QUADRANT COMPARISON:  None Available. FINDINGS: Gallbladder: Not visualized, presumably decompressed. Common bile duct: Diameter: 4 mm Liver: Diffuse increase in parenchymal echogenicity. Overall normal size. No visualized mass. Exam limited by poor penetration of the liver by the sound beam. Portal vein is patent on color Doppler imaging with normal direction of blood flow towards the liver. Other: None. IMPRESSION: 1. No acute findings. 2. Gallbladder not visualized, likely decompressed. 3. Diffuse increased liver parenchymal echogenicity consistent with hepatic steatosis. Electronically Signed   By: Amie Portland M.D.   On: 09/06/2021 12:09     LOS: 2 days   Lanae Boast, MD [facility]  09/07/2021, 9:06 AM

## 2021-09-07 NOTE — Evaluation (Signed)
Occupational Therapy Evaluation Patient Details Name: Christian Clark MRN: [id number] DOB: 1945/02/26 Today's Date: 09/07/2021   History of Present Illness Pt is a 76 y.o. M who presents from [facility] on 09/05/2021 due to behavior change. Found to have AKI secondary to dehydration and abnormal labs. 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   Clinical Impression   76 yo male with above detailed medical course and PMH. Reports he ambulates with cane or RW at baseline and is indep with ADLs. He reports he has lived in indep living for approx 3 months. He feels weak and deconditioned after illness but feels he is slowly returning back to baseline. He needs cues and occasional assist to ambulate to and from bathroom with Rw and requires assist for LB dressing. Reports he is aware of AE for LB dressing but is not interested. Will follow acutely to assist with return to baseline indep with ADLs.      Recommendations for follow up therapy are one component of a multi-disciplinary discharge planning process, led by the attending physician.  Recommendations may be updated based on patient status, additional functional criteria and insurance authorization.   Follow Up Recommendations  Home health OT    Assistance Recommended at Discharge Set up Supervision/Assistance  Patient can return home with the following A little help with walking and/or transfers;A little help with bathing/dressing/bathroom    Functional Status Assessment  Patient has had a recent decline in their functional status and demonstrates the ability to make significant improvements in function in a reasonable and predictable amount of time.  Equipment Recommendations  None recommended by OT       Precautions / Restrictions Precautions Precautions: Fall Restrictions Weight Bearing Restrictions: No      Mobility Bed Mobility Overal bed mobility: Needs Assistance Bed Mobility: Sit to Supine,  Supine to Sit     Supine to sit: Min guard, HOB elevated Sit to supine: Min assist   General bed mobility comments: cues for use of bed features to aid in posterior supine scoot to improve overall position in bed    Transfers Overall transfer level: Needs assistance Equipment used: Rolling walker (2 wheels) Transfers: Sit to/from Stand Sit to Stand: Min assist, Min guard           General transfer comment: cues for appropriate hand placement of RW      Balance Overall balance assessment: Needs assistance Sitting-balance support: Feet supported Sitting balance-[name]: Good         Standing balance comment: reliant on RW                           ADL either performed or assessed with clinical judgement   ADL Overall ADL's : Needs assistance/impaired Eating/Feeding: Independent   Grooming: Wash/dry hands;Supervision/safety   Upper Body Bathing: Set up   Lower Body Bathing: Minimal assistance   Upper Body Dressing : Set up   Lower Body Dressing: Minimal assistance;Moderate assistance Lower Body Dressing Details (indicate cue type and reason): reports he does not wear socks and uses slip on shoes bc of difficulty with LB dressing Toilet Transfer: Min guard;Grab bars;Rolling walker (2 wheels)   Toileting- Clothing Manipulation and Hygiene: Min guard;Minimal assistance;Sit to/from stand               Vision Baseline Vision/History: 1 Wears glasses Ability to See in Adequate Light: 0 Adequate Patient Visual Report: No change from baseline  Vision Assessment?: No apparent visual deficits            Pertinent Vitals/Pain Pain Assessment Pain Assessment: No/denies pain     Hand Dominance Right   Extremity/Trunk Assessment Upper Extremity Assessment Upper Extremity Assessment: Overall WFL for tasks assessed           Communication Communication Communication: No difficulties   Cognition Arousal/Alertness: Awake/alert Behavior  During Therapy: WFL for tasks assessed/performed Overall Cognitive Status: Within Functional Limits for tasks assessed               General Comments  excess dry skin to lower leg, nails need trimming            Home Living Family/patient expects to be discharged to:: Private residence Living Arrangements: Alone Available Help at Discharge: Friend(s) Type of Home: Independent living facility       Home Layout: One level     Bathroom Shower/Tub: Estate manager/land agent Accessibility: Yes   Home Equipment: Agricultural consultant (2 wheels);Cane - single point   Additional Comments: [facility]      Prior Functioning/Environment Prior Level of Function : Independent/Modified Independent             Mobility Comments: using cane vs RW, reports 1 fall in past 6 months ADLs Comments: indep at baseline        OT Problem List: Decreased strength;Decreased activity tolerance;Impaired balance (sitting and/or standing)      OT Treatment/Interventions: Self-care/ADL training;Energy conservation;Therapeutic activities    OT Goals(Current goals can be found in the care plan section) Acute Rehab OT Goals Patient Stated Goal: To increase strength and go home OT Goal Formulation: With patient Time For Goal Achievement: 09/21/21 Potential to Achieve Goals: Good ADL Goals Pt Will Perform Lower Body Dressing: with modified independence;with adaptive equipment Pt Will Transfer to Toilet: with modified independence;ambulating Pt Will Perform Toileting - Clothing Manipulation and hygiene: with modified independence;sit to/from stand  OT Frequency: Min 2X/week       AM-PAC OT "6 Clicks" Daily Activity     Outcome Measure Help from another person eating meals?: None Help from another person taking care of personal grooming?: A Little Help from another person toileting, which includes using toliet, bedpan, or urinal?: A Little Help from another person bathing (including  washing, rinsing, drying)?: A Little Help from another person to put on and taking off regular upper body clothing?: A Little Help from another person to put on and taking off regular lower body clothing?: A Lot 6 Click Score: 18   End of Session Equipment Utilized During Treatment: Rolling walker (2 wheels)  Activity Tolerance: Patient tolerated treatment well Patient left: in bed;with call bell/phone within reach  OT Visit Diagnosis: Unsteadiness on feet (R26.81);Muscle weakness (generalized) (M62.81)                Time: 5638-7564 OT Time Calculation (min): 25 min Charges:  OT General Charges $OT Visit: 1 Visit OT Evaluation $OT Eval Low Complexity: 1 Low OT Treatments $Self Care/Home Management : 8-22 mins    Jasmine Pang Jessup Ogas, OTR/L 09/07/2021, 5:32 PM

## 2021-09-08 DIAGNOSIS — F1010 Alcohol abuse, uncomplicated: Secondary | ICD-10-CM | POA: Diagnosis not present

## 2021-09-08 DIAGNOSIS — K219 Gastro-esophageal reflux disease without esophagitis: Secondary | ICD-10-CM

## 2021-09-08 DIAGNOSIS — I482 Chronic atrial fibrillation, unspecified: Secondary | ICD-10-CM

## 2021-09-08 DIAGNOSIS — I5022 Chronic systolic (congestive) heart failure: Secondary | ICD-10-CM

## 2021-09-08 DIAGNOSIS — R410 Disorientation, unspecified: Secondary | ICD-10-CM | POA: Diagnosis not present

## 2021-09-08 DIAGNOSIS — E860 Dehydration: Secondary | ICD-10-CM | POA: Diagnosis not present

## 2021-09-08 LAB — COMPREHENSIVE METABOLIC PANEL
ALT: 82 U/L — ABNORMAL HIGH (ref 0–44)
AST: 82 U/L — ABNORMAL HIGH (ref 15–41)
Albumin: 2.9 g/dL — ABNORMAL LOW (ref 3.5–5.0)
Alkaline Phosphatase: 53 U/L (ref 38–126)
Anion gap: 8 (ref 5–15)
BUN: 16 mg/dL (ref 8–23)
CO2: 29 mmol/L (ref 22–32)
Calcium: 8.6 mg/dL — ABNORMAL LOW (ref 8.9–10.3)
Chloride: 96 mmol/L — ABNORMAL LOW (ref 98–111)
Creatinine, Ser: 1.27 mg/dL — ABNORMAL HIGH (ref 0.61–1.24)
GFR, Estimated: 59 mL/min — ABNORMAL LOW (ref >60)
Glucose, Bld: 187 mg/dL — ABNORMAL HIGH (ref 70–99)
Potassium: 3.7 mmol/L (ref 3.5–5.1)
Sodium: 133 mmol/L — ABNORMAL LOW (ref 135–145)
Total Bilirubin: 1.1 mg/dL (ref 0.3–1.2)
Total Protein: 5.2 g/dL — ABNORMAL LOW (ref 6.5–8.1)

## 2021-09-08 LAB — CBC
HCT: 32.2 % — ABNORMAL LOW (ref 39.0–52.0)
Hemoglobin: 10.1 g/dL — ABNORMAL LOW (ref 13.0–17.0)
MCH: 26.4 pg (ref 26.0–34.0)
MCHC: 31.4 g/dL (ref 30.0–36.0)
MCV: 84.3 fL (ref 80.0–100.0)
Platelets: 101 10*3/uL — ABNORMAL LOW (ref 150–400)
RBC: 3.82 MIL/uL — ABNORMAL LOW (ref 4.22–5.81)
RDW: 19.6 % — ABNORMAL HIGH (ref 11.5–15.5)
WBC: 4.3 10*3/uL (ref 4.0–10.5)
nRBC: 0 % (ref 0.0–0.2)

## 2021-09-08 LAB — GLUCOSE, CAPILLARY
Glucose-Capillary: 134 mg/dL — ABNORMAL HIGH (ref 70–99)
Glucose-Capillary: 142 mg/dL — ABNORMAL HIGH (ref 70–99)
Glucose-Capillary: 170 mg/dL — ABNORMAL HIGH (ref 70–99)
Glucose-Capillary: 190 mg/dL — ABNORMAL HIGH (ref 70–99)

## 2021-09-08 MED ORDER — LORAZEPAM 0.5 MG PO TABS: 0.5000 mg | ORAL_TABLET | Freq: Four times a day (QID) | ORAL | 0 refills | Status: DC

## 2021-09-08 MED ORDER — METHOCARBAMOL 500 MG PO TABS: 500.0000 mg | ORAL_TABLET | Freq: Two times a day (BID) | ORAL | Status: DC

## 2021-09-08 MED ORDER — CARVEDILOL 3.125 MG PO TABS: 3.1250 mg | ORAL_TABLET | Freq: Two times a day (BID) | ORAL | Status: DC

## 2021-09-08 MED ORDER — ESOMEPRAZOLE MAGNESIUM 40 MG PO CPDR: 40.0000 mg | DELAYED_RELEASE_CAPSULE | Freq: Every day | ORAL | Status: DC

## 2021-09-08 MED ORDER — POTASSIUM CHLORIDE CRYS ER 10 MEQ PO TBCR: 10.0000 meq | EXTENDED_RELEASE_TABLET | Freq: Every day | ORAL | 0 refills | Status: DC

## 2021-09-08 MED ORDER — ACETAMINOPHEN 500 MG PO TABS: 500.0000 mg | ORAL_TABLET | Freq: Four times a day (QID) | ORAL | Status: DC | PRN

## 2021-09-08 MED ORDER — FOLIC ACID 1 MG PO TABS: 1.0000 mg | ORAL_TABLET | Freq: Every day | ORAL | Status: DC

## 2021-09-08 NOTE — Care Management Important Message (Signed)
Important Message  Patient Details  Name: Christian Clark MRN: [id number] Date of Birth: 01/12/1946   Medicare Important Message Given:  Yes     [name] 09/08/2021, 12:29 PM 

## 2021-09-08 NOTE — TOC Initial Note (Addendum)
Transition of Care [facility]) - Initial/Assessment Note    Patient Details  Name: Christian Clark MRN: [id number] Date of Birth: 1945-07-12  Transition of Care [facility]) CM/SW Contact:    Kingsley Plan, RN Phone Number: 09/08/2021, 12:48 PM  Clinical Narrative:                 PT recommendation : OP PT at [facility].    See SW note.   Discussed with patient , patient in agreement.   Patient requested NCM called Nicholos Johns at [facility] , "she is in charge of PT".   NCM called [facility] [phone] spoke to [place]. Per Ballard Russell is off today. Rachel requested NCM to fax PT note and discharge summary to her at [phone] and she will arrange PT. Fax sent    [facility] and [facility] at [facility] aware of above   1420 Received secure chat from SW and MD , patient will stay in hospital until 8/22 and discharge to [facility]. Shay at [facility] aware  Expected Discharge Plan: Home/Self Care Barriers to Discharge: No Barriers Identified   Patient Goals and CMS Choice Patient states their goals for this hospitalization and ongoing recovery are:: to return to home CMS Medicare.gov Compare Post Acute Care list provided to:: Patient    Expected Discharge Plan and Services Expected Discharge Plan: Home/Self Care   Discharge Planning Services: CM Consult   Living arrangements for the past 2 months: Single Family Home Expected Discharge Date: 09/08/21                 DME Agency: NA       HH Arranged: NA          Prior Living Arrangements/Services Living arrangements for the past 2 months: Single Family Home Lives with:: Self Patient language and need for interpreter reviewed:: Yes Do you feel safe going back to the place where you live?: Yes          Current home services: DME Criminal Activity/Legal Involvement Pertinent to Current Situation/Hospitalization: No - Comment as needed  Activities of Daily Living      Permission Sought/Granted    Permission granted to share information with : No              Emotional Assessment Appearance:: Appears stated age Attitude/Demeanor/Rapport: Engaged Affect (typically observed): Accepting Orientation: : Oriented to Self, Oriented to Place, Oriented to  Time, Oriented to Situation Alcohol / Substance Use: Not Applicable Psych Involvement: No (comment)  Admission diagnosis:  Dehydration [E86.0] Alcoholic ketoacidosis [E87.29] AKI (acute kidney injury) (HCC) [N17.9] Patient Active Problem List   Diagnosis Date Noted   Chronic systolic CHF (congestive heart failure) (HCC) 09/06/2021   Dehydration 09/05/2021   Acute CVA (cerebrovascular accident) (HCC) 06/04/2021   CVA (cerebral vascular accident) (HCC) 06/03/2021   Iron deficiency anemia due to chronic blood loss 06/03/2021   GERD (gastroesophageal reflux disease)    Frequent falls 11/28/2020   Atrial fibrillation, chronic (HCC) 11/28/2020   Sepsis (HCC) 11/28/2020   Alcohol abuse 11/28/2020   AMS (altered mental status) 11/28/2020   PCP:  Lauro Regulus, MD Pharmacy:   [id number] - [place], [place] - [address] AT [facility] [address] [place] [address] Phone: [phone] Fax: [phone]     Social Determinants of Health (SDOH) Interventions    Readmission Risk Interventions     No data to display

## 2021-09-08 NOTE — Progress Notes (Signed)
Mobility Specialist Progress Note   09/08/21 1117  Mobility  Activity Ambulated with assistance in hallway  Level of Assistance Minimal assist, patient does 75% or more  Assistive Device Front wheel walker  Distance Ambulated (ft) 160 ft  Activity Response Tolerated well  $Mobility charge 1 Mobility   Pt found in bed having no complaints and agreeable. Requiring minA to get to EOB d/t trunk weakness but supervision for remainder of session. Pt complaining of L foot discomfort that seem to reside w/ the progression of gait. Returned back to room w/o fault and left on the EOB,call bell in reach w/ pt's needs met.   Holland Falling Mobility Specialist MS [facility] #:  [phone] Acute Rehab Office:  [phone]

## 2021-09-08 NOTE — Care Management Important Message (Signed)
Important Message  Patient Details  Name: Christian Clark MRN: [id number] Date of Birth: [birth year]   Medicare Important Message Given:  Yes     Sherilyn Banker 09/08/2021, 12:29 PM

## 2021-09-08 NOTE — Social Work (Addendum)
CSW attempted to contact admissions at [facility], they were unavailable. CSW left a VM then called back to speak with nursing staff. They informed CSW that this pt was medically Dc'ed from their facility and would have to restart the process if he wanted to return. Pt will DC back to [facility] and follow up with [name] for [facility].   [facility] contacted CSW back, she shared that  pt left AMA and was not Dc'ed. Suggested that CSW contact pt son for the best DC plan. If he were to return [facility] would need updated clinicals.   CSW spoke with pt son who was under the impression pt would return to [facility] and was not aware he left the facility AMA. Pt son is reaching out to [name] to inquire about getting his dad back into [facility] and will contact CSW with final DC plan. With either DC plan pt will be picked up by his twin brother Omelia [facility] [facility].   Pt son contacted CSW stating [name] had some information incorrect and they can take the pt back but not until tomorrow. Pt son and brother are concerned about pt being released back to his IDL and drinking. Per doctor Pokhrel pt will stay and DC tomorrow to [facility]. CSW updated pt brother, [facility] will pick pt up at the main entrance tomorrow at noon. CSW faxed clinicals to [facility] at [phone].

## 2021-09-08 NOTE — Discharge Summary (Signed)
Physician Discharge Summary  Christian Clark ION:[id number] DOB: 1945/10/30 DOA: 09/05/2021  PCP: Lauro Regulus, MD  Admit date: 09/05/2021 Discharge date: 09/09/2021  Admitted From: [name]  Discharge disposition: [name]  Recommendations for Outpatient Follow-Up:   Follow up with your primary care provider in one week.  Check CBC, BMP, magnesium in the next visit Liver function test was elevated so repeat LFT in the next visit.  Could consider statins as outpatient.  Discharge Diagnosis:   Principal Problem:   Dehydration Active Problems:   Alcohol abuse   AMS (altered mental status)   GERD (gastroesophageal reflux disease)   Chronic systolic CHF (congestive heart failure) (HCC)   Atrial fibrillation, chronic (HCC)  Discharge Condition: Improved.  Diet recommendation: Regular.  Heart healthy diet encouraged.  Wound care: None.  Code status: Full.   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 By report, last drink was 24 hrs prior to admission and he was thought to need medical detox. Thus, he is referred to [facility] for evaluation and treatment.  In the ED, patient had creatinine of 1.3.  Magnesium of 1.6.  Lactate was 1.7.  Patient was then admitted hospital for  further evaluation and treatment.  Hospital Course:   Following conditions were addressed during hospitalization as listed below,  Acute metabolic encephalopathy received high-dose IV thiamine during hospitalization.  We will continue oral thiamine.  Metabolic encephalopathy has resolved at this time.  CT head showed previous stroke.  Patient was by PT OT and recommended no therapy needs at this time.  Alcohol abuse hx At risk for withdrawal:  Was continued on Ativan protocol without much withdrawal symptoms.  Received thiamine folic acid multivitamins.  Continue thiamine folic acid on discharge.   AKI secondary to Dehydration/Anion gap metabolic acidosis Hypochloremic hyponatremia mild: improved at this time.  Creatinine on 09/08/21 at 1.2.  .  Creatinine was 1.2 back in May and  previously 1.0 in 2022.   Hypomagnesemia: Replenished.   Mild hyperglycemia prediabetes with A1c 6.1 in May 2023,now at 5.8, Advised dietary modification.   Chronic atrial fibrillation with history of previous stroke  in May: Continue Crestor and Eliquis on discharge   Elevated LFT Hepatic steatosis in RUQ [place]: Could consider statins as outpatient after repeating LFTs.  Mild thrombocytopenia related to alcohol usage.  No bleeding  Disposition.  At this time, patient is stable for disposition back to [facility].  Medical Consultants:   None.  Procedures:    None Subjective:   Today, patient was seen and examined at bedside.  Denies any tremors sweating dizziness lightheadedness nausea vomiting.  Feels better.  Discharge Exam:   Vitals:   09/08/21 0553 09/08/21 0745  BP: 110/76 107/65  Pulse: 65 78  Resp: 18 17  Temp: 98.3 F (36.8 C) 97.7 F (36.5 C)  SpO2: 97% 94%   Vitals:   09/07/21 1622 09/07/21  2153 09/08/21 0553 09/08/21 0745  BP: 115/72 100/72 110/76 107/65  Pulse: 63 (!) 51 65 78  Resp: 18 20 18 17   Temp: 98 F (36.7 C) 97.7 F (36.5 C) 98.3 F (36.8 C) 97.7 F (36.5  C)  TempSrc: Oral Oral Oral Oral  SpO2: 96% 93% 97% 94%  Weight:      Height:       General: Alert awake, not in obvious distress HENT: pupils equally reacting to light,  No scleral pallor or icterus noted. Oral mucosa is moist.  Chest:  Clear breath sounds.  Diminished breath sounds bilaterally. No crackles or wheezes.  CVS: S1 &S2 heard. No murmur.  Regular rate and rhythm. Abdomen: Soft, nontender, nondistended.  Bowel sounds are heard.   Extremities: No cyanosis, clubbing or edema.  Peripheral pulses are palpable. Psych: Alert, awake and oriented, normal mood CNS:  No cranial nerve deficits.  Power equal in all extremities.   Skin: Warm and dry.  No rashes noted.  The results of significant diagnostics from this hospitalization (including imaging, microbiology, ancillary and laboratory) are listed below for reference.     Diagnostic Studies:   CT HEAD WO CONTRAST (5MM)  Result Date: 09/06/2021 CLINICAL DATA:  Delirium, confusion EXAM: CT HEAD WITHOUT CONTRAST TECHNIQUE: Contiguous axial images were obtained from the base of the skull through the vertex without intravenous contrast. RADIATION DOSE REDUCTION: This exam was performed according to the departmental dose-optimization program which includes automated exposure control, adjustment of the mA and/or kV according to patient size and/or use of iterative reconstruction technique. COMPARISON:  06/03/2021 FINDINGS: Brain: No evidence of acute infarction, hemorrhage, extra-axial collection, ventriculomegaly, or mass effect. Bilateral frontal lobe and right parietal lobe encephalomalacia from prior infarcts. Generalized cerebral atrophy. Periventricular white matter low attenuation likely secondary to microangiopathy. Vascular: Cerebrovascular atherosclerotic calcifications are noted. Skull: Negative for fracture or focal lesion. Sinuses/Orbits: Visualized portions of the orbits are unremarkable. Visualized portions of the paranasal  sinuses are unremarkable. Visualized portions of the mastoid air cells are unremarkable. Other: None. IMPRESSION: 1. No acute intracranial findings. 2. Bilateral frontal lobe and right parietal lobe encephalomalacia from prior infarcts. 3. Chronic small vessel ischemic changes. Electronically Signed   By: Kathreen Devoid M.D.   On: 09/06/2021 12:54   US Abdomen Limited RUQ (LIVER/GB)  Result Date: 09/06/2021 CLINICAL DATA:  Abnormal liver function test. EXAM: ULTRASOUND ABDOMEN LIMITED RIGHT UPPER QUADRANT COMPARISON:  None Available. FINDINGS: Gallbladder: Not visualized, presumably decompressed. Common bile duct: Diameter: 4 mm Liver: Diffuse increase in parenchymal echogenicity. Overall normal size. No visualized mass. Exam limited by poor penetration of the liver by the sound beam. Portal vein is patent on color Doppler imaging with normal direction of blood flow towards the liver. Other: None. IMPRESSION: 1. No acute findings. 2. Gallbladder not visualized, likely decompressed. 3. Diffuse increased liver parenchymal echogenicity consistent with hepatic steatosis. Electronically Signed   By: Lajean Manes M.D.   On: 09/06/2021 12:09     Labs:   Basic Metabolic Panel: Recent Labs  Lab 09/05/21 2039 09/06/21 0215 09/06/21 1033 09/07/21 0202 09/08/21 0149  NA 134* 132* 133* 133* 133*  K 4.4 4.1 3.9 3.7 3.7  CL 95* 94* 96* 94* 96*  CO2 21* 21* 24 26 29   GLUCOSE 170* 224* 124* 130* 187*  BUN 18 17 16 15 16   CREATININE 1.30* 1.47* 1.13 1.30* 1.27*  CALCIUM 9.0 9.0 8.7* 8.5* 8.6*  MG 1.6*  --   --   --   --  PHOS 4.0  --   --   --   --    GFR Estimated Creatinine Clearance: 58 mL/min (A) (by C-G formula based on SCr of 1.27 mg/dL (H)). Liver Function Tests: Recent Labs  Lab 09/06/21 1033 09/07/21 0202 09/08/21 0149  AST 119* 84* 82*  ALT 112* 89* 82*  ALKPHOS 56 51 53  BILITOT 2.7* 1.9* 1.1  PROT 5.5* 5.2* 5.2*  ALBUMIN 3.1* 2.9* 2.9*   No results for input(s): "LIPASE",  "AMYLASE" in the last 168 hours. No results for input(s): "AMMONIA" in the last 168 hours. Coagulation profile No results for input(s): "INR", "PROTIME" in the last 168 hours.  CBC: Recent Labs  Lab 09/05/21 2039 09/06/21 1033 09/07/21 0202 09/08/21 0149  WBC 5.8 5.5 5.0 4.3  NEUTROABS 4.4  --   --   --   HGB 12.2* 11.0* 10.4* 10.1*  HCT 38.2* 34.1* 32.0* 32.2*  MCV 82.3 81.4 81.4 84.3  PLT 137* 112* 110* 101*   Cardiac Enzymes: No results for input(s): "CKTOTAL", "CKMB", "CKMBINDEX", "TROPONINI" in the last 168 hours. BNP: Invalid input(s): "POCBNP" CBG: Recent Labs  Lab 09/07/21 0800 09/07/21 1204 09/07/21 1621 09/07/21 2150 09/08/21 0741  GLUCAP 195* 173* 116* 159* 134*   D-Dimer No results for input(s): "DDIMER" in the last 72 hours. Hgb A1c Recent Labs    09/06/21 1033  HGBA1C 5.8*   Lipid Profile No results for input(s): "CHOL", "HDL", "LDLCALC", "TRIG", "CHOLHDL", "LDLDIRECT" in the last 72 hours. Thyroid function studies No results for input(s): "TSH", "T4TOTAL", "T3FREE", "THYROIDAB" in the last 72 hours.  Invalid input(s): "FREET3" Anemia work up No results for input(s): "VITAMINB12", "FOLATE", "FERRITIN", "TIBC", "IRON", "RETICCTPCT" in the last 72 hours. Microbiology No results found for this or any previous visit (from the past 240 hour(s)).   Discharge Instructions:   Discharge Instructions     Diet general   Complete by: As directed    Discharge instructions   Complete by: As directed    Encourage oral hydration.  Continue thiamine.  Seek medical attention for worsening symptoms.  Follow-up with primary care provider in 1 to 2 weeks.   Increase activity slowly   Complete by: As directed       Allergies as of 09/08/2021       Reactions   Chocolate Hives, Itching   Iodine Hives   Peanut Oil Hives, Itching   Penicillins Hives   Shellfish Allergy Hives        Medication List     STOP taking these medications    diazepam 5  MG/ML injection Commonly known as: VALIUM   thiamine 100 MG/ML injection Commonly known as: VITAMIN B1       TAKE these medications    acetaminophen 500 MG tablet Commonly known as: TYLENOL Take 1 tablet (500 mg total) by mouth every 6 (six) hours as needed for moderate pain or mild pain. What changed:  how much to take when to take this reasons to take this   carvedilol 3.125 MG tablet Commonly known as: COREG Take 1 tablet (3.125 mg total) by mouth 2 (two) times daily.   cetirizine 10 MG tablet Commonly known as: ZYRTEC Take 10 mg by mouth daily.   Eliquis 5 MG Tabs tablet Generic drug: apixaban Take 1 tablet (5 mg total) by mouth 2 (two) times daily.   esomeprazole 40 MG capsule Commonly known as: NEXIUM Take 1 capsule (40 mg total) by mouth daily.   folic acid 1 MG tablet  Commonly known as: FOLVITE Take 1 tablet (1 mg total) by mouth daily.   LORazepam 0.5 MG tablet Commonly known as: ATIVAN Take 1 tablet (0.5 mg total) by mouth 4 (four) times daily.   methocarbamol 500 MG tablet Commonly known as: ROBAXIN Take 1 tablet (500 mg total) by mouth 2 (two) times daily. For muscle spasms   multivitamin with minerals Tabs tablet Take 1 tablet by mouth daily.   potassium chloride 10 MEQ tablet Commonly known as: KLOR-CON M Take 1 tablet (10 mEq total) by mouth daily for 5 days. What changed: how much to take   thiamine 100 MG tablet Commonly known as: Vitamin B-1 Take 100 mg by mouth daily.   traZODone 50 MG tablet Commonly known as: DESYREL Take 50 mg by mouth at bedtime as needed for sleep.        Follow-up Information     Kirk Ruths, MD Follow up in 1 week(s).   Specialty: Internal Medicine Contact information: [place] [address] [phone]                  Time coordinating discharge: 39 minutes  Signed:  Malcom Selmer  [facility] 09/08/2021, 9:41  AM

## 2021-09-09 LAB — COMPREHENSIVE METABOLIC PANEL
ALT: 87 U/L — ABNORMAL HIGH (ref 0–44)
AST: 89 U/L — ABNORMAL HIGH (ref 15–41)
Albumin: 2.9 g/dL — ABNORMAL LOW (ref 3.5–5.0)
Alkaline Phosphatase: 48 U/L (ref 38–126)
Anion gap: 8 (ref 5–15)
BUN: 16 mg/dL (ref 8–23)
CO2: 27 mmol/L (ref 22–32)
Calcium: 8.6 mg/dL — ABNORMAL LOW (ref 8.9–10.3)
Chloride: 99 mmol/L (ref 98–111)
Creatinine, Ser: 1.17 mg/dL (ref 0.61–1.24)
GFR, Estimated: >60 mL/min (ref >60)
Glucose, Bld: 146 mg/dL — ABNORMAL HIGH (ref 70–99)
Potassium: 3.8 mmol/L (ref 3.5–5.1)
Sodium: 134 mmol/L — ABNORMAL LOW (ref 135–145)
Total Bilirubin: 1 mg/dL (ref 0.3–1.2)
Total Protein: 5.3 g/dL — ABNORMAL LOW (ref 6.5–8.1)

## 2021-09-09 LAB — CBC
HCT: 32.4 % — ABNORMAL LOW (ref 39.0–52.0)
Hemoglobin: 10.2 g/dL — ABNORMAL LOW (ref 13.0–17.0)
MCH: 26.3 pg (ref 26.0–34.0)
MCHC: 31.5 g/dL (ref 30.0–36.0)
MCV: 83.5 fL (ref 80.0–100.0)
Platelets: 106 10*3/uL — ABNORMAL LOW (ref 150–400)
RBC: 3.88 MIL/uL — ABNORMAL LOW (ref 4.22–5.81)
RDW: 19.6 % — ABNORMAL HIGH (ref 11.5–15.5)
WBC: 4.5 10*3/uL (ref 4.0–10.5)
nRBC: 0 % (ref 0.0–0.2)

## 2021-09-09 LAB — GLUCOSE, CAPILLARY
Glucose-Capillary: 106 mg/dL — ABNORMAL HIGH (ref 70–99)
Glucose-Capillary: 124 mg/dL — ABNORMAL HIGH (ref 70–99)
Glucose-Capillary: 169 mg/dL — ABNORMAL HIGH (ref 70–99)
Glucose-Capillary: 194 mg/dL — ABNORMAL HIGH (ref 70–99)

## 2021-09-09 MED ORDER — MELATONIN 3 MG PO TABS
3.0000 mg | ORAL_TABLET | Freq: Every day | ORAL | Status: DC
  Administered 2021-09-09 (×2): 3 mg via ORAL
  Filled 2021-09-09 (×2): qty 1

## 2021-09-09 NOTE — TOC CM/SW Note (Addendum)
NCM called [facility] to notify them that [name] is unable to take patient back until he completes OP PT.   NCM was transferred to [facility] Perches , will will discuss with Librarian, academic at [facility] to see if patient can return to [place].      Cindy with [place] returned call . Aware of above .   She will call patient's son and brother and discuss disposition.

## 2021-09-09 NOTE — TOC Progression Note (Addendum)
Transition of Care [facility]) - Progression Note    Patient Details  Name: Christian Clark MRN: [id number] Date of Birth: [birth year]  Transition of Care [facility] CM/SW Contact  Ivette Loyal, [place] Phone Number: 09/09/2021, 11:48 AM  Clinical Narrative:    CSW spoke with pt brother who was told that he has been in contact with [name] this morning and they could not accept pt today due to the outpatient OT recommendation. CSW contacted [name] the get more information on what was needed for DC today and faxed over an updated PT note stating that pt no longer needs the outpatient OT. This was recommended if pt was to return home at his IDL. CSW was told that the nursing director would review chart and contact CSW back about DC today.  CSW contacted [name] to gain insight about DC at noon was suppose to be the original time for pick up. CSW was told that they cancelled pt transport and admission for today. CSW informed them of the changes that were updated on the chart and faxed over for admission today. Admission will contact CSW with a new pick up time.   CSW contacted the nursing director at [name] to see if we could get the pt DC today. Said she does not think the pt can take care of himself there although the recommendation was changed bc he could not have progressed that well from yesterday until today.  He will need to DC home and get outpatient PT, then reapply to be readmitted at a later time.   CSW contacted pt son to provide update, he was not aware that [name] would not accept pt back today. Pt son will follow up with [facility] but pt will still need to DC home with outpatient PT follow up.    Expected Discharge Plan: Home/Self Care Barriers to Discharge: No Barriers Identified  Expected Discharge Plan and Services Expected Discharge Plan: Home/Self Care   Discharge Planning Services: CM Consult   Living arrangements for the past 2 months:  Single Family Home Expected Discharge Date: 09/09/21                 DME Agency: NA       HH Arranged: NA           Social Determinants of Health (SDOH) Interventions    Readmission Risk Interventions     No data to display

## 2021-09-09 NOTE — Plan of Care (Signed)
  Problem: Coping: Goal: Ability to adjust to condition or change in health will improve Outcome: Progressing   

## 2021-09-09 NOTE — Progress Notes (Signed)
Patient seen and examined at bedside.  No interval complaints.  No limitations noted.  No signs of withdrawal.  Vitals reviewed.  Physical exam essentially unchanged.  Patient is stable for disposition to hospitalist to follow-up.  Please refer to discharge summary dated 09/08/2021.

## 2021-09-09 NOTE — Progress Notes (Signed)
Mobility Specialist Progress Note:   09/09/21 0910  Mobility  Activity  (chair level exercises)  Range of Motion/Exercises Active  Level of Assistance Independent  Activity Response Tolerated well  $Mobility charge 1 Mobility   Pt received in chair, declining ambulation as he just worked with PT. Educated on chair level exercises, pt demonstrated well. Eager for d/c, pt left with all needs met.   Nelta Numbers Acute Rehab Secure Chat or Office Phone: [phone]

## 2021-09-09 NOTE — Progress Notes (Addendum)
Physical Therapy Treatment Patient Details Name: Christian Clark MRN: [id number] DOB: September 12, 1945 Today's Date: 09/09/2021   History of Present Illness Pt is a 76 y.o. M who presents from [facility] on 09/05/2021 due to behavior change. Found to have AKI secondary to dehydration and abnormal labs. 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    PT Comments    Pt progressing well towards his physical therapy goals. Ambulating 150 ft with a walker at a supervision level. Continues to demonstrate shuffling gait pattern with decreased stride length. Worked on stepping with visual targets to promote increased step length with fair carryover for gait. Will continue follow acutely.    Recommendations for follow up therapy are one component of a multi-disciplinary discharge planning process, led by the attending physician.  Recommendations may be updated based on patient status, additional functional criteria and insurance authorization.  Follow Up Recommendations  No PT follow up (plans to d/c to [name])     Assistance Recommended at Discharge PRN  Patient can return home with the following Assistance with cooking/housework;Assist for transportation   Equipment Recommendations  None recommended by PT    Recommendations for Other Services       Precautions / Restrictions Precautions Precautions: Fall Restrictions Weight Bearing Restrictions: No     Mobility  Bed Mobility Overal bed mobility: Needs Assistance Bed Mobility: Supine to Sit     Supine to sit: Supervision, HOB elevated     General bed mobility comments: No physical assist required to sit up on EOB, however, HOB significantly elevated. Increased time/effort    Transfers Overall transfer level: Needs assistance Equipment used: Rolling walker (2 wheels) Transfers: Sit to/from Stand Sit to Stand: Supervision           General transfer comment: supervision for safety     Ambulation/Gait Ambulation/Gait assistance: Supervision Gait Distance (Feet): 150 Feet Assistive device: Rolling walker (2 wheels) Gait Pattern/deviations: Step-through pattern, Decreased stride length, Decreased dorsiflexion - right, Decreased dorsiflexion - left, Shuffle Gait velocity: decreased     General Gait Details: Shuffling gait pattern, cues for increased stride length but pt unable to significantly correct. Worked on targeted stepping with visual cues with fair carryover for gait   Stairs             Wheelchair Mobility    Modified Rankin (Stroke Patients Only)       Balance Overall balance assessment: Needs assistance Sitting-balance support: Feet supported Sitting balance-[name]: Good     Standing balance support: Bilateral upper extremity supported Standing balance-[name]: Poor Standing balance comment: reliant on RW                            Cognition Arousal/Alertness: Awake/alert Behavior During Therapy: WFL for tasks assessed/performed Overall Cognitive Status: Within Functional Limits for tasks assessed                                          Exercises Other Exercises Other Exercises: Targeted stepping with visual cues for increased stride length x 10 ft each side    General Comments        Pertinent Vitals/Pain Pain Assessment Pain Assessment: Faces Faces Pain Scale: Hurts a little bit Pain Location: L medial foot Pain Descriptors / Indicators: Aching Pain Intervention(s): Monitored during session    Home  Living                          Prior Function            PT Goals (current goals can now be found in the care plan section) Acute Rehab PT Goals Patient Stated Goal: to get stronger PT Goal Formulation: With patient Time For Goal Achievement: 09/21/21 Potential to Achieve Goals: Good Progress towards PT goals: Progressing toward goals    Frequency    Min  3X/week      PT Plan Current plan remains appropriate    Co-evaluation              AM-PAC PT "6 Clicks" Mobility   Outcome Measure  Help needed turning from your back to your side while in a flat bed without using bedrails?: None Help needed moving from lying on your back to sitting on the side of a flat bed without using bedrails?: A Little Help needed moving to and from a bed to a chair (including a wheelchair)?: A Little Help needed standing up from a chair using your arms (e.g., wheelchair or bedside chair)?: A Little Help needed to walk in hospital room?: A Little Help needed climbing 3-5 steps with a railing? : A Little 6 Click Score: 19    End of Session   Activity Tolerance: Patient tolerated treatment well Patient left: with call bell/phone within reach;in chair;with chair alarm set Nurse Communication: Mobility status PT Visit Diagnosis: Unsteadiness on feet (R26.81);Other abnormalities of gait and mobility (R26.89)     Time: 0254-2706 PT Time Calculation (min) (ACUTE ONLY): 34 min  Charges:  $Gait Training: 8-22 mins $Therapeutic Activity: 8-22 mins                     Lillia Pauls, PT, DPT Acute Rehabilitation Services Office [phone]    Norval Morton 09/09/2021, 10:30 AM

## 2021-09-10 DIAGNOSIS — R410 Disorientation, unspecified: Secondary | ICD-10-CM | POA: Diagnosis not present

## 2021-09-10 DIAGNOSIS — E860 Dehydration: Secondary | ICD-10-CM | POA: Diagnosis not present

## 2021-09-10 DIAGNOSIS — I482 Chronic atrial fibrillation, unspecified: Secondary | ICD-10-CM | POA: Diagnosis not present

## 2021-09-10 DIAGNOSIS — F1010 Alcohol abuse, uncomplicated: Secondary | ICD-10-CM | POA: Diagnosis not present

## 2021-09-10 LAB — COMPREHENSIVE METABOLIC PANEL
ALT: 98 U/L — ABNORMAL HIGH (ref 0–44)
AST: 98 U/L — ABNORMAL HIGH (ref 15–41)
Albumin: 2.8 g/dL — ABNORMAL LOW (ref 3.5–5.0)
Alkaline Phosphatase: 44 U/L (ref 38–126)
Anion gap: 8 (ref 5–15)
BUN: 11 mg/dL (ref 8–23)
CO2: 26 mmol/L (ref 22–32)
Calcium: 8.4 mg/dL — ABNORMAL LOW (ref 8.9–10.3)
Chloride: 100 mmol/L (ref 98–111)
Creatinine, Ser: 1.01 mg/dL (ref 0.61–1.24)
GFR, Estimated: >60 mL/min (ref >60)
Glucose, Bld: 204 mg/dL — ABNORMAL HIGH (ref 70–99)
Potassium: 3.7 mmol/L (ref 3.5–5.1)
Sodium: 134 mmol/L — ABNORMAL LOW (ref 135–145)
Total Bilirubin: 0.6 mg/dL (ref 0.3–1.2)
Total Protein: 5.2 g/dL — ABNORMAL LOW (ref 6.5–8.1)

## 2021-09-10 LAB — CBC
HCT: 30.7 % — ABNORMAL LOW (ref 39.0–52.0)
Hemoglobin: 9.7 g/dL — ABNORMAL LOW (ref 13.0–17.0)
MCH: 26.1 pg (ref 26.0–34.0)
MCHC: 31.6 g/dL (ref 30.0–36.0)
MCV: 82.5 fL (ref 80.0–100.0)
Platelets: 125 10*3/uL — ABNORMAL LOW (ref 150–400)
RBC: 3.72 MIL/uL — ABNORMAL LOW (ref 4.22–5.81)
RDW: 19.6 % — ABNORMAL HIGH (ref 11.5–15.5)
WBC: 4.6 10*3/uL (ref 4.0–10.5)
nRBC: 0 % (ref 0.0–0.2)

## 2021-09-10 LAB — GLUCOSE, CAPILLARY
Glucose-Capillary: 147 mg/dL — ABNORMAL HIGH (ref 70–99)
Glucose-Capillary: 150 mg/dL — ABNORMAL HIGH (ref 70–99)
Glucose-Capillary: 130 mg/dL — ABNORMAL HIGH (ref 70–99)

## 2021-09-10 MED ORDER — ACETAMINOPHEN 500 MG PO TABS: 500.0000 mg | ORAL_TABLET | Freq: Four times a day (QID) | ORAL | 0 refills | Status: AC | PRN

## 2021-09-10 MED ORDER — TRAZODONE HCL 50 MG PO TABS: 50.0000 mg | ORAL_TABLET | Freq: Every evening | ORAL | 2 refills | Status: AC | PRN

## 2021-09-10 MED ORDER — ELIQUIS 5 MG PO TABS: 5.0000 mg | ORAL_TABLET | Freq: Two times a day (BID) | ORAL | 2 refills | Status: AC

## 2021-09-10 MED ORDER — CETIRIZINE HCL 10 MG PO TABS: 10.0000 mg | ORAL_TABLET | Freq: Every day | ORAL | 2 refills | Status: DC

## 2021-09-10 MED ORDER — VITAMIN B-1 100 MG PO TABS: 100.0000 mg | ORAL_TABLET | Freq: Every day | ORAL | 0 refills | Status: AC

## 2021-09-10 MED ORDER — ESOMEPRAZOLE MAGNESIUM 40 MG PO CPDR: 40.0000 mg | DELAYED_RELEASE_CAPSULE | Freq: Every day | ORAL | 2 refills | Status: AC

## 2021-09-10 MED ORDER — METHOCARBAMOL 500 MG PO TABS: 500.0000 mg | ORAL_TABLET | Freq: Two times a day (BID) | ORAL | 0 refills | Status: AC

## 2021-09-10 MED ORDER — POTASSIUM CHLORIDE CRYS ER 10 MEQ PO TBCR: 10.0000 meq | EXTENDED_RELEASE_TABLET | Freq: Every day | ORAL | 0 refills | Status: DC

## 2021-09-10 MED ORDER — FOLIC ACID 1 MG PO TABS: 1.0000 mg | ORAL_TABLET | Freq: Every day | ORAL | 0 refills | Status: AC

## 2021-09-10 MED ORDER — ADULT MULTIVITAMIN W/MINERALS CH: 1.0000 | ORAL_TABLET | Freq: Every day | ORAL | 0 refills | Status: AC

## 2021-09-10 MED ORDER — CARVEDILOL 3.125 MG PO TABS: 3.1250 mg | ORAL_TABLET | Freq: Two times a day (BID) | ORAL | 2 refills | Status: DC

## 2021-09-10 NOTE — Progress Notes (Signed)
Occupational Therapy Treatment Patient Details Name: Christian Clark MRN: [id number] DOB: 22-May-1945 Today's Date: 09/10/2021   History of present illness Pt is a 76 y.o. M who presents from [facility] on 09/05/2021 due to behavior change. Found to have AKI secondary to dehydration and abnormal labs. 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   OT comments  Pt making excellent progress towards OT goals, able to demo LB dressing and various ADLs standing at sink > 7 min without safety concerns. Minor problem solving cues provided for fall prevention strategies during ADLs as pt reports need for one UE support in standing at all times (more so in the morning than later in the day). Based on progress, updated DC recs to no OT follow up and anticipate pt to return to PLOF quickly.   Recommendations for follow up therapy are one component of a multi-disciplinary discharge planning process, led by the attending physician.  Recommendations may be updated based on patient status, additional functional criteria and insurance authorization.    Follow Up Recommendations  No OT follow up    Assistance Recommended at Discharge PRN  Patient can return home with the following  A little help with bathing/dressing/bathroom   Equipment Recommendations  None recommended by OT    Recommendations for Other Services      Precautions / Restrictions Precautions Precautions: Fall Restrictions Weight Bearing Restrictions: No       Mobility Bed Mobility               General bed mobility comments: sitting EOB on entry    Transfers Overall transfer level: Needs assistance Equipment used: Rolling walker (2 wheels) Transfers: Sit to/from Stand Sit to Stand: Supervision           General transfer comment: requesting handheld assist to stand though pt able to pull self up holding to therapist's hand without assist to come to standing     Balance Overall balance  assessment: Needs assistance Sitting-balance support: Feet supported Sitting balance-[name]: Good     Standing balance support: Bilateral upper extremity supported Standing balance-[name]: Poor Standing balance comment: reliant on at least one UE support in standing                           ADL either performed or assessed with clinical judgement   ADL Overall ADL's : Needs assistance/impaired     Grooming: Set up;Standing;Wash/dry face Grooming Details (indicate cue type and reason): and washing hair standing at sink, cues for safety in drying hair d/t pt reporting need for at least one UE support at all times             Lower Body Dressing: Set up;Sit to/from stand Lower Body Dressing Details (indicate cue type and reason): donning tennis shoes EOB     Toileting- Clothing Manipulation and Hygiene: Supervision/safety;Sit to/from stand Toileting - Clothing Manipulation Details (indicate cue type and reason): standing with urinal use     Functional mobility during ADLs: Supervision/safety;Rolling walker (2 wheels) General ADL Comments: Emphasis on standing tolerance and safety with ADLs at sink. Pt able to stand > 7 min without rest break, cues for fall prevention strategies and NT in to assist in finishing bath    Extremity/Trunk Assessment Upper Extremity Assessment Upper Extremity Assessment: Overall WFL for tasks assessed   Lower Extremity Assessment Lower Extremity Assessment: Defer to PT evaluation  Vision   Vision Assessment?: No apparent visual deficits   Perception     Praxis      Cognition Arousal/Alertness: Awake/alert Behavior During Therapy: WFL for tasks assessed/performed Overall Cognitive Status: Within Functional Limits for tasks assessed                                          Exercises      Shoulder Instructions       General Comments      Pertinent Vitals/ Pain       Pain  Assessment Pain Assessment: No/denies pain Pain Intervention(s): Monitored during session  Home Living                                          Prior Functioning/Environment              Frequency  Min 2X/week        Progress Toward Goals  OT Goals(current goals can now be found in the care plan section)  Progress towards OT goals: Progressing toward goals  Acute Rehab OT Goals Patient Stated Goal: go for a walk, leave at noon today OT Goal Formulation: With patient Time For Goal Achievement: 09/21/21 Potential to Achieve Goals: Good ADL Goals Pt Will Perform Lower Body Dressing: with modified independence;with adaptive equipment Pt Will Transfer to Toilet: with modified independence;ambulating Pt Will Perform Toileting - Clothing Manipulation and hygiene: with modified independence;sit to/from stand  Plan Discharge plan needs to be updated    Co-evaluation                 AM-PAC OT "6 Clicks" Daily Activity     Outcome Measure   Help from another person eating meals?: None Help from another person taking care of personal grooming?: A Little Help from another person toileting, which includes using toliet, bedpan, or urinal?: A Little Help from another person bathing (including washing, rinsing, drying)?: A Little Help from another person to put on and taking off regular upper body clothing?: A Little Help from another person to put on and taking off regular lower body clothing?: A Little 6 Click Score: 19    End of Session Equipment Utilized During Treatment: Rolling walker (2 wheels)  OT Visit Diagnosis: Unsteadiness on feet (R26.81);Muscle weakness (generalized) (M62.81)   Activity Tolerance Patient tolerated treatment well   Patient Left Other (comment) (seated at sink with NT present)   Nurse Communication          Time: 8003-4917 OT Time Calculation (min): 20 min  Charges: OT General Charges $OT Visit: 1 Visit OT  Treatments $Self Care/Home Management : 8-22 mins  Christian Clark, OTR/L Acute Rehab Services Office: [phone]   Christian Clark 09/10/2021, 9:43 AM

## 2021-09-10 NOTE — Discharge Instructions (Signed)
Follow up with your primary care provider in one week.  Check CBC, BMP, magnesium in the next visit Liver function test was elevated so repeat LFT in the next visit.  Could consider statins as outpatient.

## 2021-09-10 NOTE — TOC Progression Note (Signed)
Transition of Care [facility]) - Progression Note    Patient Details  Name: Christian Clark MRN: [id number] Date of Birth: 24-Dec-1945  Transition of Care [facility] CM/SW Contact  Ivette Loyal, [place] Phone Number: 09/10/2021, 10:10 AM  Clinical Narrative:    CSW spoke with pt about DC plan, pt stated he will be returning to IDL at [facility] and his friend from [place] will come and pick him up around noon. CSW attempted to contact pt brother who has been assisting pt with decisions. No answer, pt is A&O and is able to DC on his own terms.    Expected Discharge Plan: Home/Self Care Barriers to Discharge: No Barriers Identified  Expected Discharge Plan and Services Expected Discharge Plan: Home/Self Care   Discharge Planning Services: CM Consult   Living arrangements for the past 2 months: Single Family Home Expected Discharge Date: 09/09/21                 DME Agency: NA       HH Arranged: NA           Social Determinants of Health (SDOH) Interventions    Readmission Risk Interventions     No data to display

## 2021-09-10 NOTE — Discharge Summary (Signed)
Physician Discharge Summary  Christian Clark [id number] DOB: [birth year] DOA: 09/05/2021  PCP: Kirk Ruths, MD  Admit date: 09/05/2021 Discharge date: 09/10/2021  Admitted From: [facility]  Discharge disposition: ALF  Recommendations for Outpatient Follow-Up:   Follow up with your primary care provider in one week.  Check CBC, BMP, magnesium in the next visit Liver function test was elevated so repeat LFT in the next visit.  Could consider statins as outpatient.  Discharge Diagnosis:   Active Problems:   Alcohol abuse   GERD (gastroesophageal reflux disease)   Chronic systolic CHF (congestive heart failure) (HCC)   Atrial fibrillation, chronic ([place])  Discharge Condition: Improved.  Diet recommendation: Regular.  Heart healthy diet encouraged.  Wound care: None.  Code status: Full.   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 By report, last drink was 24 hrs prior to admission and he was thought to need medical detox. Thus, he is referred to [facility] for evaluation and treatment.  In the ED, patient had creatinine of 1.3.  Magnesium of 1.6.  Lactate was 1.7.  Patient was then admitted hospital for further evaluation and treatment.  Hospital Course:   Following conditions  were addressed during hospitalization as listed below,  Acute metabolic encephalopathy received high-dose IV thiamine during hospitalization.  We will continue oral thiamine.  Metabolic encephalopathy has resolved at this time.  CT head showed previous stroke.  Patient was by PT OT and recommended no therapy needs at this time.  Alcohol abuse hx At risk for withdrawal:  Was continued on Ativan protocol without much withdrawal symptoms.  Received thiamine folic acid multivitamins.  Continue thiamine folic acid on discharge.   AKI secondary to Dehydration/Anion gap metabolic acidosis Hypochloremic hyponatremia mild: improved at this time.  Creatinine on 09/08/21 at 1.2.  .  Creatinine was 1.2 back in May and  previously 1.0 in 2022.   Hypomagnesemia: Replenished.   Mild hyperglycemia prediabetes with A1c 6.1 in May 2023,now at 5.8, Advised dietary modification.   Chronic atrial fibrillation with history of previous stroke  in May: Continue Crestor and Eliquis on discharge   Elevated LFT Hepatic steatosis in RUQ [place]: Could consider statins as outpatient after repeating LFTs.  Mild thrombocytopenia related to alcohol usage.  No bleeding  Disposition.  At this time, patient is stable for disposition to ALF  Medical Consultants:   None.  Procedures:    None Subjective:   Today, patient was seen and examined at bedside.  Denies any tremors sweating, dizziness lightheadedness nausea vomiting.  Feels better.  Discharge Exam:   Vitals:   09/10/21 0436 09/10/21 0814  BP: 127/77 113/65  Pulse: 60 61  Resp: 18 18  Temp: 98.6 F (37 C) 98.6 F (37 C)  SpO2: 96% 96%   Vitals:   09/09/21 1453 09/09/21 1944 09/10/21 0436 09/10/21 0814  BP: 108/62 122/70 127/77 113/65  Pulse: Marland Kitchen)  50 (!) 59 60 61  Resp: 18 18 18 18   Temp: 97.7 F (36.5 C) 98.3 F (36.8 C) 98.6 F (37 C) 98.6 F (37 C)  TempSrc:  Oral Oral Oral  SpO2: 98% 98% 96% 96%  Weight:      Height:       General:  Alert awake, not in obvious distress HENT: pupils equally reacting to light,  No scleral pallor or icterus noted. Oral mucosa is moist.  Chest:  Clear breath sounds.  Diminished breath sounds bilaterally. No crackles or wheezes.  CVS: S1 &S2 heard. No murmur.  Regular rate and rhythm. Abdomen: Soft, nontender, nondistended.  Bowel sounds are heard.   Extremities: No cyanosis, clubbing or edema.  Peripheral pulses are palpable. Psych: Alert, awake and oriented, normal mood CNS:  No cranial nerve deficits.  Power equal in all extremities.   Skin: Warm and dry.  No rashes noted.  The results of significant diagnostics from this hospitalization (including imaging, microbiology, ancillary and laboratory) are listed below for reference.     Diagnostic Studies:   CT HEAD WO CONTRAST ( )  Result Date: 09/06/2021 CLINICAL DATA:  Delirium, confusion EXAM: CT HEAD WITHOUT CONTRAST TECHNIQUE: Contiguous axial images were obtained from the base of the skull through the vertex without intravenous contrast. RADIATION DOSE REDUCTION: This exam was performed according to the departmental dose-optimization program which includes automated exposure control, adjustment of the mA and/or kV according to patient size and/or use of iterative reconstruction technique. COMPARISON:  06/03/2021 FINDINGS: Brain: No evidence of acute infarction, hemorrhage, extra-axial collection, ventriculomegaly, or mass effect. Bilateral frontal lobe and right parietal lobe encephalomalacia from prior infarcts. Generalized cerebral atrophy. Periventricular white matter low attenuation likely secondary to microangiopathy. Vascular: Cerebrovascular atherosclerotic calcifications are noted. Skull: Negative for fracture or focal lesion. Sinuses/Orbits: Visualized portions of the orbits are unremarkable. Visualized portions of the paranasal sinuses are unremarkable. Visualized portions of the mastoid air cells are unremarkable. Other: None.  IMPRESSION: 1. No acute intracranial findings. 2. Bilateral frontal lobe and right parietal lobe encephalomalacia from prior infarcts. 3. Chronic small vessel ischemic changes. Electronically Signed   By: 06/05/2021 M.D.   On: 09/06/2021 12:54   09/08/2021 Abdomen Limited RUQ (LIVER/GB)  Result Date: 09/06/2021 CLINICAL DATA:  Abnormal liver function test. EXAM: ULTRASOUND ABDOMEN LIMITED RIGHT UPPER QUADRANT COMPARISON:  None Available. FINDINGS: Gallbladder: Not visualized, presumably decompressed. Common bile duct: Diameter: 4 mm Liver: Diffuse increase in parenchymal echogenicity. Overall normal size. No visualized mass. Exam limited by poor penetration of the liver by the sound beam. Portal vein is patent on color Doppler imaging with normal direction of blood flow towards the liver. Other: None. IMPRESSION: 1. No acute findings. 2. Gallbladder not visualized, likely decompressed. 3. Diffuse increased liver parenchymal echogenicity consistent with hepatic steatosis. Electronically Signed   By: 09/08/2021 M.D.   On: 09/06/2021 12:09     Labs:   Basic Metabolic Panel: Recent Labs  Lab 09/05/21 2039 09/06/21 0215 09/06/21 1033 09/07/21 0202 09/08/21 0149 09/09/21 0144 09/10/21 0202  NA 134*   < > 133* 133* 133* 134* 134*  K 4.4   < > 3.9 3.7 3.7 3.8 3.7  CL 95*   < > 96* 94* 96* 99 100  CO2 21*   < > 24 26 29 27 26   GLUCOSE 170*   < > 124* 130* 187* 146* 204*  BUN 18   < > 16 15 16 16 11   CREATININE 1.30*   < >  1.13 1.30* 1.27* 1.17 1.01  CALCIUM 9.0   < > 8.7* 8.5* 8.6* 8.6* 8.4*  MG 1.6*  --   --   --   --   --   --   PHOS 4.0  --   --   --   --   --   --    < > = values in this interval not displayed.   GFR Estimated Creatinine Clearance: 72.9 mL/min (by C-G formula based on SCr of 1.01 mg/dL). Liver Function Tests: Recent Labs  Lab 09/06/21 1033 09/07/21 0202 09/08/21 0149 09/09/21 0144 09/10/21 0202  AST 119* 84* 82* 89* 98*  ALT 112* 89* 82* 87* 98*  ALKPHOS 56 51 53  48 44  BILITOT 2.7* 1.9* 1.1 1.0 0.6  PROT 5.5* 5.2* 5.2* 5.3* 5.2*  ALBUMIN 3.1* 2.9* 2.9* 2.9* 2.8*   No results for input(s): "LIPASE", "AMYLASE" in the last 168 hours. No results for input(s): "AMMONIA" in the last 168 hours. Coagulation profile No results for input(s): "INR", "PROTIME" in the last 168 hours.  CBC: Recent Labs  Lab 09/05/21 2039 09/06/21 1033 09/07/21 0202 09/08/21 0149 09/09/21 0144 09/10/21 0202  WBC 5.8 5.5 5.0 4.3 4.5 4.6  NEUTROABS 4.4  --   --   --   --   --   HGB 12.2* 11.0* 10.4* 10.1* 10.2* 9.7*  HCT 38.2* 34.1* 32.0* 32.2* 32.4* 30.7*  MCV 82.3 81.4 81.4 84.3 83.5 82.5  PLT 137* 112* 110* 101* 106* 125*   Cardiac Enzymes: No results for input(s): "CKTOTAL", "CKMB", "CKMBINDEX", "TROPONINI" in the last 168 hours. BNP: Invalid input(s): "POCBNP" CBG: Recent Labs  Lab 09/09/21 1152 09/09/21 1633 09/09/21 2348 09/10/21 0438 09/10/21 0814  GLUCAP 194* 106* 169* 147* 150*   D-Dimer No results for input(s): "DDIMER" in the last 72 hours. Hgb A1c No results for input(s): "HGBA1C" in the last 72 hours.  Lipid Profile No results for input(s): "CHOL", "HDL", "LDLCALC", "TRIG", "CHOLHDL", "LDLDIRECT" in the last 72 hours. Thyroid function studies No results for input(s): "TSH", "T4TOTAL", "T3FREE", "THYROIDAB" in the last 72 hours.  Invalid input(s): "FREET3" Anemia work up No results for input(s): "VITAMINB12", "FOLATE", "FERRITIN", "TIBC", "IRON", "RETICCTPCT" in the last 72 hours. Microbiology No results found for this or any previous visit (from the past 240 hour(s)).   Discharge Instructions:   Discharge Instructions     Diet general   Complete by: As directed    Discharge instructions   Complete by: As directed    Encourage oral hydration.  Continue thiamine.  Seek medical attention for worsening symptoms.  Follow-up with primary care provider in 1 to 2 weeks.   Increase activity slowly   Complete by: As directed        Allergies as of 09/10/2021       Reactions   Chocolate Hives, Itching   Iodine Hives   Peanut Oil Hives, Itching   Penicillins Hives   Shellfish Allergy Hives        Medication List     STOP taking these medications    diazepam 5 MG/ML injection Commonly known as: VALIUM   thiamine 100 MG/ML injection Commonly known as: VITAMIN B1       TAKE these medications    acetaminophen 500 MG tablet Commonly known as: TYLENOL Take 1 tablet (500 mg total) by mouth every 6 (six) hours as needed for moderate pain or mild pain. What changed:  how much to take when to take this reasons  to take this Notes to patient: Limit Tylenol to 2000mg  per day with elevated liver function tests.  Follow-up with repeat labs and consult doctor again at that time.   carvedilol 3.125 MG tablet Commonly known as: COREG Take 1 tablet (3.125 mg total) by mouth 2 (two) times daily.   cetirizine 10 MG tablet Commonly known as: ZYRTEC Take 10 mg by mouth daily.   Eliquis 5 MG Tabs tablet Generic drug: apixaban Take 1 tablet (5 mg total) by mouth 2 (two) times daily.   esomeprazole 40 MG capsule Commonly known as: NEXIUM Take 1 capsule (40 mg total) by mouth daily.   folic acid 1 MG tablet Commonly known as: FOLVITE Take 1 tablet (1 mg total) by mouth daily.   LORazepam 0.5 MG tablet Commonly known as: ATIVAN Take 1 tablet (0.5 mg total) by mouth 4 (four) times daily.   methocarbamol 500 MG tablet Commonly known as: ROBAXIN Take 1 tablet (500 mg total) by mouth 2 (two) times daily. For muscle spasms   multivitamin with minerals Tabs tablet Take 1 tablet by mouth daily.   potassium chloride 10 MEQ tablet Commonly known as: KLOR-CON M Take 1 tablet (10 mEq total) by mouth daily for 5 days. What changed: how much to take   thiamine 100 MG tablet Commonly known as: Vitamin B-1 Take 100 mg by mouth daily.   traZODone 50 MG tablet Commonly known as: DESYREL Take 50 mg by mouth at  bedtime as needed for sleep.        Follow-up Information     , MD Follow up in 1 week(s).   Specialty: Internal Medicine Contact information: [address] [facility] [place] [phone]                  Time coordinating discharge: 39 minutes  Signed:  Lashaye Fisk  [facility] 09/10/2021, 10:49 AM

## 2021-09-10 NOTE — Progress Notes (Signed)
Mobility Specialist - Progress Note   09/10/21 1000  Mobility  Activity Ambulated with assistance in hallway  Level of Assistance Minimal assist, patient does 75% or more  Assistive Device Front wheel walker  Distance Ambulated (ft) 160 ft  Activity Response Tolerated well  $Mobility charge 1 Mobility    Pt received in recliner agreeable to mobility. C/o mild pain on top of left foot when bearing weight. Left in recliner w/call bell and all needs met.   Paulla Dolly Mobility Specialist

## 2021-09-10 NOTE — Progress Notes (Signed)
Nsg Discharge Note  Admit Date:  09/05/2021 Discharge date: 09/10/2021   [facility] to be D/C'd Home per MD order.  AVS completed. Patient/caregiver able to verbalize understanding.  Discharge Medication: Allergies as of 09/10/2021       Reactions   Chocolate Hives, Itching   Iodine Hives   Peanut Oil Hives, Itching   Penicillins Hives   Shellfish Allergy Hives        Medication List     STOP taking these medications    diazepam 5 MG/ML injection Commonly known as: VALIUM   LORazepam 0.5 MG tablet Commonly known as: ATIVAN   thiamine 100 MG/ML injection Commonly known as: VITAMIN B1       TAKE these medications    acetaminophen 500 MG tablet Commonly known as: TYLENOL Take 1 tablet (500 mg total) by mouth every 6 (six) hours as needed for moderate pain or mild pain. What changed:  how much to take when to take this reasons to take this   carvedilol 3.125 MG tablet Commonly known as: COREG Take 1 tablet (3.125 mg total) by mouth 2 (two) times daily.   cetirizine 10 MG tablet Commonly known as: ZYRTEC Take 1 tablet (10 mg total) by mouth daily.   Eliquis 5 MG Tabs tablet Generic drug: apixaban Take 1 tablet (5 mg total) by mouth 2 (two) times daily.   esomeprazole 40 MG capsule Commonly known as: NEXIUM Take 1 capsule (40 mg total) by mouth daily.   folic acid 1 MG tablet Commonly known as: FOLVITE Take 1 tablet (1 mg total) by mouth daily.   methocarbamol 500 MG tablet Commonly known as: ROBAXIN Take 1 tablet (500 mg total) by mouth 2 (two) times daily for 5 days. For muscle spasms   multivitamin with minerals Tabs tablet Take 1 tablet by mouth daily.   potassium chloride 10 MEQ tablet Commonly known as: KLOR-CON M Take 1 tablet (10 mEq total) by mouth daily for 5 days. What changed: how much to take   thiamine 100 MG tablet Commonly known as: Vitamin B-1 Take 1 tablet (100 mg total) by mouth daily.   traZODone 50 MG  tablet Commonly known as: DESYREL Take 1 tablet (50 mg total) by mouth at bedtime as needed for sleep.        Discharge Assessment: Vitals:   09/10/21 0436 09/10/21 0814  BP: 127/77 113/65  Pulse: 60 61  Resp: 18 18  Temp: 98.6 F (37 C) 98.6 F (37 C)  SpO2: 96% 96%   Skin clean, dry and intact without evidence of skin break down, no evidence of skin tears noted. IV catheter discontinued intact. Site without signs and symptoms of complications - no redness or edema noted at insertion site, patient denies c/o pain - only slight tenderness at site.  Dressing with slight pressure applied.  D/c Instructions-Education: Discharge instructions given to patient/family with verbalized understanding. D/c education completed with patient/family including follow up instructions, medication list, d/c activities limitations if indicated, with other d/c instructions as indicated by MD - patient able to verbalize understanding, all questions fully answered. Patient instructed to return to ED, call 911, or call MD for any changes in condition.  Patient escorted via WC, and D/C home via private auto.  Kizzie Bane, RN 09/10/2021 12:43 PM

## 2021-10-15 LAB — HM HEPATITIS C SCREENING LAB: HM Hepatitis Screen: NEGATIVE

## 2021-10-20 ENCOUNTER — Telehealth: Payer: Self-pay | Admitting: Cardiology

## 2021-10-20 NOTE — Telephone Encounter (Signed)
Referral for this patient came across your inbox again. I reached out to the patient and left a message to call back for scheduling [place] NP-C Structural Heart Team  Pager: [phone] Phone: [phone]

## 2021-10-22 NOTE — Telephone Encounter (Signed)
The referral returned because the patient cancelled his appointment.   See 07/28/2021 phone note. Instructed Dr. Tonette Bihari nurse to call if the patient wishes to be scheduled for [facility] consult.

## 2021-10-28 ENCOUNTER — Ambulatory Visit: Payer: Medicare Other | Admitting: Infectious Diseases

## 2021-11-25 ENCOUNTER — Ambulatory Visit: Payer: Medicare Other | Admitting: Dermatology

## 2023-10-05 ENCOUNTER — Inpatient Hospital Stay
Admission: EM | Admit: 2023-10-05 | Discharge: 2023-10-07 | DRG: 291 | Disposition: A | Discharge status: Home Health | Source: Ambulatory Visit | Attending: Internal Medicine | Admitting: Internal Medicine

## 2023-10-05 ENCOUNTER — Emergency Department

## 2023-10-05 ENCOUNTER — Other Ambulatory Visit: Payer: Self-pay

## 2023-10-05 DIAGNOSIS — I110 Hypertensive heart disease with heart failure: Secondary | ICD-10-CM | POA: Diagnosis present

## 2023-10-05 DIAGNOSIS — E785 Hyperlipidemia, unspecified: Secondary | ICD-10-CM | POA: Diagnosis present

## 2023-10-05 DIAGNOSIS — Z91041 Radiographic dye allergy status: Secondary | ICD-10-CM | POA: Diagnosis not present

## 2023-10-05 DIAGNOSIS — J9601 Acute respiratory failure with hypoxia: Secondary | ICD-10-CM | POA: Diagnosis present

## 2023-10-05 DIAGNOSIS — Z880 Allergy status to penicillin: Secondary | ICD-10-CM | POA: Diagnosis not present

## 2023-10-05 DIAGNOSIS — Z8673 Personal history of transient ischemic attack (TIA), and cerebral infarction without residual deficits: Secondary | ICD-10-CM

## 2023-10-05 DIAGNOSIS — F1010 Alcohol abuse, uncomplicated: Secondary | ICD-10-CM | POA: Diagnosis present

## 2023-10-05 DIAGNOSIS — Z91199 Patient's noncompliance with other medical treatment and regimen due to unspecified reason: Secondary | ICD-10-CM

## 2023-10-05 DIAGNOSIS — E669 Obesity, unspecified: Secondary | ICD-10-CM | POA: Diagnosis present

## 2023-10-05 DIAGNOSIS — I5023 Acute on chronic systolic (congestive) heart failure: Secondary | ICD-10-CM | POA: Diagnosis present

## 2023-10-05 DIAGNOSIS — R296 Repeated falls: Secondary | ICD-10-CM | POA: Diagnosis present

## 2023-10-05 DIAGNOSIS — I482 Chronic atrial fibrillation, unspecified: Secondary | ICD-10-CM | POA: Diagnosis present

## 2023-10-05 DIAGNOSIS — Z87891 Personal history of nicotine dependence: Secondary | ICD-10-CM

## 2023-10-05 DIAGNOSIS — D500 Iron deficiency anemia secondary to blood loss (chronic): Secondary | ICD-10-CM | POA: Diagnosis present

## 2023-10-05 DIAGNOSIS — Z91013 Allergy to seafood: Secondary | ICD-10-CM

## 2023-10-05 DIAGNOSIS — I509 Heart failure, unspecified: Secondary | ICD-10-CM | POA: Diagnosis present

## 2023-10-05 DIAGNOSIS — Z91018 Allergy to other foods: Secondary | ICD-10-CM | POA: Diagnosis not present

## 2023-10-05 DIAGNOSIS — Z79899 Other long term (current) drug therapy: Secondary | ICD-10-CM

## 2023-10-05 DIAGNOSIS — G4733 Obstructive sleep apnea (adult) (pediatric): Secondary | ICD-10-CM | POA: Diagnosis present

## 2023-10-05 DIAGNOSIS — I2510 Atherosclerotic heart disease of native coronary artery without angina pectoris: Secondary | ICD-10-CM | POA: Diagnosis present

## 2023-10-05 DIAGNOSIS — Z91010 Allergy to peanuts: Secondary | ICD-10-CM

## 2023-10-05 DIAGNOSIS — K219 Gastro-esophageal reflux disease without esophagitis: Secondary | ICD-10-CM | POA: Diagnosis present

## 2023-10-05 DIAGNOSIS — Z7901 Long term (current) use of anticoagulants: Secondary | ICD-10-CM | POA: Diagnosis not present

## 2023-10-05 DIAGNOSIS — Z1152 Encounter for screening for COVID-19: Secondary | ICD-10-CM

## 2023-10-05 DIAGNOSIS — I5021 Acute systolic (congestive) heart failure: Secondary | ICD-10-CM | POA: Diagnosis not present

## 2023-10-05 DIAGNOSIS — I5022 Chronic systolic (congestive) heart failure: Secondary | ICD-10-CM | POA: Diagnosis present

## 2023-10-05 LAB — TROPONIN I (HIGH SENSITIVITY)
Troponin I (High Sensitivity): 29 ng/L — ABNORMAL HIGH (ref <18)
Troponin I (High Sensitivity): 29 ng/L — ABNORMAL HIGH (ref <18)

## 2023-10-05 LAB — BASIC METABOLIC PANEL WITH GFR
Anion gap: 13 (ref 5–15)
BUN: 12 mg/dL (ref 8–23)
CO2: 24 mmol/L (ref 22–32)
Calcium: 9.1 mg/dL (ref 8.9–10.3)
Chloride: 103 mmol/L (ref 98–111)
Creatinine, Ser: 0.93 mg/dL (ref 0.61–1.24)
GFR, Estimated: >60 mL/min (ref >60)
Glucose, Bld: 138 mg/dL — ABNORMAL HIGH (ref 70–99)
Potassium: 3.6 mmol/L (ref 3.5–5.1)
Sodium: 140 mmol/L (ref 135–145)

## 2023-10-05 LAB — CBC
HCT: 35.4 % — ABNORMAL LOW (ref 39.0–52.0)
Hemoglobin: 10.9 g/dL — ABNORMAL LOW (ref 13.0–17.0)
MCH: 29.8 pg (ref 26.0–34.0)
MCHC: 30.8 g/dL (ref 30.0–36.0)
MCV: 96.7 fL (ref 80.0–100.0)
Platelets: 197 K/uL (ref 150–400)
RBC: 3.66 MIL/uL — ABNORMAL LOW (ref 4.22–5.81)
RDW: 15.2 % (ref 11.5–15.5)
WBC: 8.4 K/uL (ref 4.0–10.5)
nRBC: 0 % (ref 0.0–0.2)

## 2023-10-05 LAB — BRAIN NATRIURETIC PEPTIDE: B Natriuretic Peptide: 236.9 pg/mL — ABNORMAL HIGH (ref 0.0–100.0)

## 2023-10-05 MED ORDER — SODIUM CHLORIDE 0.9 % IV SOLN: 250.0000 mL | INTRAVENOUS | Status: AC | PRN

## 2023-10-05 MED ORDER — ACETAMINOPHEN 325 MG PO TABS: 650.0000 mg | ORAL_TABLET | ORAL | Status: DC | PRN

## 2023-10-05 MED ORDER — APIXABAN 5 MG PO TABS
5.0000 mg | ORAL_TABLET | Freq: Two times a day (BID) | ORAL | Status: DC
  Administered 2023-10-05 – 2023-10-07 (×4): 5 mg via ORAL
  Filled 2023-10-05 (×4): qty 1

## 2023-10-05 MED ORDER — FUROSEMIDE 10 MG/ML IJ SOLN
40.0000 mg | Freq: Two times a day (BID) | INTRAMUSCULAR | Status: DC
  Administered 2023-10-06 (×2): 40 mg via INTRAVENOUS
  Filled 2023-10-05 (×2): qty 4

## 2023-10-05 MED ORDER — SODIUM CHLORIDE 0.9% FLUSH: 3.0000 mL | INTRAVENOUS | Status: DC | PRN

## 2023-10-05 MED ORDER — CARVEDILOL 25 MG PO TABS
25.0000 mg | ORAL_TABLET | Freq: Two times a day (BID) | ORAL | Status: DC
  Administered 2023-10-06 – 2023-10-07 (×3): 25 mg via ORAL
  Filled 2023-10-05: qty 1
  Filled 2023-10-05 (×2): qty 4

## 2023-10-05 MED ORDER — SODIUM CHLORIDE 0.9% FLUSH
3.0000 mL | Freq: Two times a day (BID) | INTRAVENOUS | Status: DC
Start: 2023-10-05 — End: 2023-10-07
  Administered 2023-10-05: 3 mL via INTRAVENOUS
  Administered 2023-10-06: 10 mL via INTRAVENOUS
  Administered 2023-10-06 – 2023-10-07 (×2): 3 mL via INTRAVENOUS

## 2023-10-05 MED ORDER — DAPAGLIFLOZIN PROPANEDIOL 10 MG PO TABS
10.0000 mg | ORAL_TABLET | Freq: Every day | ORAL | Status: DC
  Administered 2023-10-05 – 2023-10-07 (×3): 10 mg via ORAL
  Filled 2023-10-05 (×3): qty 1

## 2023-10-05 MED ORDER — FUROSEMIDE 10 MG/ML IJ SOLN
40.0000 mg | Freq: Once | INTRAMUSCULAR | Status: AC
  Administered 2023-10-05: 40 mg via INTRAVENOUS
  Filled 2023-10-05: qty 4

## 2023-10-05 MED ORDER — ONDANSETRON HCL 4 MG/2ML IJ SOLN: 4.0000 mg | Freq: Four times a day (QID) | INTRAMUSCULAR | Status: DC | PRN

## 2023-10-05 NOTE — ED Triage Notes (Signed)
 Pt to ED via POV from [facility]. Pt reports bilateral leg swelling x2 wks. Pt denies SOB or CP. Pt on eliquis  for hx of afib and blood clots. Pt reports was on fluid pill but was taken off. Pt RA 89%. Pt placed on 2L [place]

## 2023-10-05 NOTE — ED Provider Notes (Signed)
 [facility] Provider Note    Event Date/Time   First MD Initiated Contact with Patient 10/05/23 2041     (approximate)   History   Leg Swelling   HPI  Christian Clark is a 78 y.o. male history of pernicious anemia, B12 deficiency, on apixaban , A-fib coronary disease and congestive heart failure   Reports 2 weeks of increased swelling in both legs.  Reminds him about 3 years ago when he had to start a fluid pill that helped quite a bit.  Has not been sleeping well for a week or 2.  Has not noticed any chest pain or shortness of breath but has noticed it is hard to sleep at night feels like increasing swelling is gained misti believes a few to several pounds.  No fevers no chills no chest pain not coughing.  Reports a history of congestive heart failure.  Takes blood thinner, and reports that he is taking it twice daily.  States that for A-fib  Had an echocardiogram about 3 months ago at a hospital somewhere near [place]  he believes and has a history of CHF  Physical Exam   Triage Vital Signs: ED Triage Vitals  Encounter Vitals Group     BP 10/05/23 1724 (!) 142/86     Girls Systolic BP Percentile --      Girls Diastolic BP Percentile --      Boys Systolic BP Percentile --      Boys Diastolic BP Percentile --      Pulse Rate 10/05/23 1721 74     Resp 10/05/23 1721 18     Temp 10/05/23 1721 98 F (36.7 C)     Temp Source 10/05/23 1721 Oral     SpO2 10/05/23 1724 (!) 89 %     Weight 10/05/23 1712 246 lb (111.6 kg)     Height 10/05/23 1712 5' 10 (1.778 m)     Head Circumference --      Peak Flow --      Pain Score 10/05/23 1722 8     Pain Loc --      Pain Education --      Exclude from Growth Chart --     Most recent vital signs: Vitals:   10/05/23 1724 10/05/23 2030  BP: (!) 142/86 (!) 161/80  Pulse:  77  Resp:    Temp:    SpO2: (!) 89% 97%     General: Awake, no distress.  Pleasant, resting sitting up at the  bedside CV:  Good peripheral perfusion.  Normal tones Resp:  Normal effort.  Slightly diminished in the bases bilaterally.  No obvious rales or crackles Abd:  No distention.  Soft nontender Other:  Moderate bilateral lower extremity pitting edema with venous stasis changes with no open wounds or sores no noted   ED Results / Procedures / Treatments   Labs (all labs ordered are listed, but only abnormal results are displayed) Labs Reviewed  BASIC METABOLIC PANEL WITH GFR - Abnormal; Notable for the following components:      Result Value   Glucose, Bld 138 (*)    All other components within normal limits  CBC - Abnormal; Notable for the following components:   RBC 3.66 (*)    Hemoglobin 10.9 (*)    HCT 35.4 (*)    All other components within normal limits  BRAIN NATRIURETIC PEPTIDE - Abnormal; Notable for the following components:   B Natriuretic Peptide 236.9 (*)  All other components within normal limits  TROPONIN I (HIGH SENSITIVITY) - Abnormal; Notable for the following components:   Troponin I (High Sensitivity) 29 (*)    All other components within normal limits  TROPONIN I (HIGH SENSITIVITY)     EKG  And inter by me at 1730 heart rate 65 QRS 80 QTc 440 Baseline artifact, suspicious for underlying A-fib.  Difficult to exclude.  No evidence of frank ischemia though nonspecific T wave abnormality noted   RADIOLOGY  Chest x-ray interpreted by me as cardiomegaly, possible bibasilar opacities versus edema   DG Chest 2 View Result Date: 10/05/2023 CLINICAL DATA:  hypoxia EXAM: CHEST - 2 VIEW COMPARISON:  Chest x-ray 11/28/2020 FINDINGS: Patient is rotated. The heart and mediastinal contours are within normal limits. Mitral annular replacement. Right base patchy airspace opacity. No pulmonary edema. Trace right pleural effusion. No pneumothorax. No acute osseous abnormality.  Sternotomy wires are intact. IMPRESSION: Right base patchy airspace opacity with trace right  pleural effusion. Electronically Signed   By: Morgane  Naveau M.D.   On: 10/05/2023 18:04     Radiologist over read as patchy airspace opacity trace right pleural effusion  PROCEDURES:  Critical Care performed: No  Procedures   MEDICATIONS ORDERED IN ED: Medications  furosemide  (LASIX ) injection 40 mg (has no administration in time range)     IMPRESSION / MDM / ASSESSMENT AND PLAN / ED COURSE  I reviewed the triage vital signs and the nursing notes.                              Differential diagnosis includes, but is not limited to, volume overload CHF, no associated chest pain or dyspnea but is noted to have 2 L oxygen requirement which is abnormal, appears quite volume overloaded.  No fevers no elevated white count no symptoms that would suggest obvious infectious cause and he has a history of CHF with similar presentation.  He is compliant with his anticoagulation for A-fib making concerns for thromboembolism very low and with no associated acute chest pain I think this would be highly unlikely especially given his clinical findings seem to match those of CHF.    Patient's presentation is most consistent with acute complicated illness / injury requiring diagnostic workup.   The patient is on the cardiac monitor to evaluate for evidence of arrhythmia and/or significant heart rate changes.  Discussed with patient agreeable with plan for initiating IV Lasix .  Due to his oxygen requirement will require hospitalization which patient agrees with.  Labs interpreted as elevated BNP very minimally elevated troponin suspect likely slight demand type picture but no associated chest pain  I consulted with patient accepted to hospital service by Dr.***      FINAL CLINICAL IMPRESSION(S) / ED DIAGNOSES   Final diagnoses:  Acute on chronic congestive heart failure, unspecified heart failure type (HCC)     Rx / DC Orders   ED Discharge Orders     None        Note:  This  document was prepared using Dragon voice recognition software and may include unintentional dictation errors.

## 2023-10-05 NOTE — ED Triage Notes (Signed)
 Brought from [facility]. Bilateral swollen and flaky legs. Hard to touch. Non compliant with medications.   A&O x4 per KC  KC vitals: 149/70 b/p 92HR 93% RA 97.9oral

## 2023-10-05 NOTE — H&P (Signed)
 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 male with medical history significant of history of alcohol abuse, history of substance abuse, atrial fibrillation, frequent falls, history of CVA, chronic systolic heart failure, iron  deficiency anemia who presented with significant lower extremity edema, shortness of breath with exertion, overall anasarca.  Patient has apparently been noncompliant with treatment.  He was seen and evaluated in the ER.  Appears to have fluid overload.  Patient also has been out of his Eliquis  but getting yesterday.  He has chronic A-fib and has not seen the doctor lately.  He had prior history of tobacco and alcohol.  Patient said he quit tobacco about 15 years ago and quit alcohol also.  Patient being admitted with acute exacerbation of CHF.  Review of Systems: As mentioned in the history of present illness. All other systems reviewed and are negative. Past Medical History:  Diagnosis Date   GERD (gastroesophageal reflux disease)    Substance abuse (HCC)    Past Surgical History:  Procedure Laterality Date   JOINT REPLACEMENT     Social History:  reports that he quit smoking about 15 years ago. His smoking use included cigarettes. He started smoking about 35 years ago. He has a 20 pack-year smoking history. He has never used smokeless tobacco. He reports that he does not currently use alcohol. He reports that he does not use drugs.  Allergies  Allergen Reactions   Chocolate Hives and Itching   Iodine Hives   Peanut Oil Hives and Itching   Penicillins Hives   Shellfish Allergy Hives    Family History  Problem Relation Age of Onset   Atrial fibrillation Sister     Prior to  Admission medications   Medication Sig Start Date End Date Taking? Authorizing Provider  acetaminophen  (TYLENOL ) 500 MG tablet Take 1 tablet (500 mg total) by mouth every 6 (six) hours as needed for moderate pain or mild pain. 09/10/21   Pokhrel, Laxman, MD  carvedilol  (COREG ) 3.125 MG tablet Take 1 tablet (3.125 mg total) by mouth 2 (two) times daily. 09/10/21 12/09/21  Pokhrel, Laxman, MD  cetirizine  (ZYRTEC ) 10 MG tablet Take 1 tablet (10 mg total) by mouth daily. 09/10/21 12/09/21  Pokhrel, Laxman, MD  ELIQUIS  5 MG TABS tablet Take 1 tablet (5 mg total) by mouth 2 (two) times daily. 09/10/21 12/09/21  Pokhrel, Laxman, MD  esomeprazole  (NEXIUM ) 40 MG capsule Take 1 capsule (40 mg total) by mouth daily. 09/10/21 12/09/21  Pokhrel, Laxman, MD  potassium chloride  (KLOR-CON  M) 10 MEQ tablet Take 1 tablet (10 mEq total) by mouth daily for 5 days. 09/10/21 09/15/21  Pokhrel, Laxman, MD  traZODone  (DESYREL ) 50 MG tablet Take 1 tablet (50 mg total) by mouth at bedtime as needed for sleep. 09/10/21 12/09/21  Sonjia Held, MD    Physical Exam: Vitals:   10/05/23 1712 10/05/23 1721 10/05/23 1724 10/05/23 2030  BP:   (!) 142/86 (!) 161/80  Pulse:  74  77  Resp:  18    Temp:  98 F (36.7 C)    TempSrc:  Oral    SpO2:   (!) 89% 97%  Weight: 111.6 kg     Height: 5' 10 (1.778 m)  Constitutional: Chronically ill looking, obese, NAD, calm, comfortable Eyes: PERRL, lids and conjunctivae normal ENMT: Mucous membranes are moist. Posterior pharynx clear of any exudate or lesions.Normal dentition.  Neck: normal, supple, no masses, no thyromegaly Respiratory: Coarse breath sound with diffuse crackles, some rhonchi, normal respiratory effort. No accessory muscle use.  Cardiovascular: Irregularly irregular rate and rhythm, no murmurs / rubs / gallops.  2+ pedal edema. 2+ pedal pulses. No carotid bruits.  Abdomen: no tenderness, no masses palpated. No hepatosplenomegaly. Bowel sounds positive.   Musculoskeletal: Good range of motion, no joint swelling or tenderness, Skin: no rashes, lesions, ulcers. No induration Neurologic: CN 2-12 grossly intact. Sensation intact, DTR normal. Strength 5/5 in all 4.  Psychiatric: Normal judgment and insight. Alert and oriented x 3. Normal mood  Data Reviewed:  Temperature 98, blood pressure 116/80, pulse 77 respiratory rate of 18 oxygen sat 89% on room air.  BNP 236.9, troponin 29, glucose 138 chest x-ray showed right base of patchy of airspace opacity with trace right pleural effusion.  EKG shows atrial fibrillation with a rate of 65.  No significant ST changes  Assessment and Plan:  #1 acute exacerbation of systolic CHF: Patient appears to have anasarca.  Will admit the patient.  Initiate IV diuresis.  Beta-blockers.  Patient may also get ARB or ACE inhibitors as tolerated by renal function.  Also initiate Farxiga .  Get echocardiogram and consider cardiology consultation in the morning.  #2 chronic atrial fibrillation: Rate is controlled.  Also on Eliquis  that we will continue with.  Continue with beta-blockers.  #3 acute hypoxic respiratory failure: Secondary to CHF exacerbation.  Questionable pneumonia.  Continue to monitor.  #4 iron  deficiency anemia: Hemoglobin 10.9.  No transfusion at this point.  #5 history of CVA: Appears stable no residuals.  #6 GERD: Will initiate PPIs    Advance Care Planning:   Code Status: Full Code   Consults: Consult cardiology in the morning  Family Communication: Caregiver at bedside  Severity of Illness: The appropriate patient status for this patient is INPATIENT. Inpatient status is judged to be reasonable and necessary in order to provide the required intensity of service to ensure the patient's safety. The patient's presenting symptoms, physical exam findings, and initial radiographic and laboratory data in the context of their chronic comorbidities is felt to place them at high risk for further  clinical deterioration. Furthermore, it is not anticipated that the patient will be medically stable for discharge from the hospital within 2 midnights of admission.   * I certify that at the point of admission it is my clinical judgment that the patient will require inpatient hospital care spanning beyond 2 midnights from the point of admission due to high intensity of service, high risk for further deterioration and high frequency of surveillance required.*  AuthorBETHA SIM KNOLL, MD 10/05/2023 9:30 PM  For on call review [email or web address].

## 2023-10-06 ENCOUNTER — Inpatient Hospital Stay
Admit: 2023-10-06 | Discharge: 2023-10-06 | Disposition: A | Discharge status: Home / Self Care | Attending: Internal Medicine | Admitting: Internal Medicine

## 2023-10-06 DIAGNOSIS — I482 Chronic atrial fibrillation, unspecified: Secondary | ICD-10-CM | POA: Diagnosis not present

## 2023-10-06 DIAGNOSIS — I5021 Acute systolic (congestive) heart failure: Secondary | ICD-10-CM

## 2023-10-06 DIAGNOSIS — K219 Gastro-esophageal reflux disease without esophagitis: Secondary | ICD-10-CM | POA: Diagnosis not present

## 2023-10-06 DIAGNOSIS — F1010 Alcohol abuse, uncomplicated: Secondary | ICD-10-CM | POA: Diagnosis not present

## 2023-10-06 DIAGNOSIS — I5023 Acute on chronic systolic (congestive) heart failure: Secondary | ICD-10-CM | POA: Diagnosis not present

## 2023-10-06 LAB — ECHOCARDIOGRAM COMPLETE
AR max vel: 1.78 cm2
AV Area VTI: 1.75 cm2
AV Area mean vel: 1.58 cm2
AV Mean grad: 7 mmHg
AV Peak grad: 13 mmHg
Ao pk vel: 1.8 m/s
Area-P 1/2: 1.55 cm2
Calc EF: 57.9 %
Height: 70 in
MV VTI: 1.36 cm2
S' Lateral: 3.4 cm
Single Plane A2C EF: 44.7 %
Single Plane A4C EF: 65.1 %
Weight: 3936 [oz_av]

## 2023-10-06 LAB — BASIC METABOLIC PANEL WITH GFR
Anion gap: 13 (ref 5–15)
BUN: 10 mg/dL (ref 8–23)
CO2: 25 mmol/L (ref 22–32)
Calcium: 8.1 mg/dL — ABNORMAL LOW (ref 8.9–10.3)
Chloride: 101 mmol/L (ref 98–111)
Creatinine, Ser: 0.87 mg/dL (ref 0.61–1.24)
GFR, Estimated: >60 mL/min (ref >60)
Glucose, Bld: 98 mg/dL (ref 70–99)
Potassium: 3.2 mmol/L — ABNORMAL LOW (ref 3.5–5.1)
Sodium: 139 mmol/L (ref 135–145)

## 2023-10-06 MED ORDER — PANTOPRAZOLE SODIUM 40 MG PO TBEC
40.0000 mg | DELAYED_RELEASE_TABLET | Freq: Every day | ORAL | Status: DC
Start: 2023-10-06 — End: 2023-10-07
  Administered 2023-10-06 – 2023-10-07 (×2): 40 mg via ORAL
  Filled 2023-10-06 (×2): qty 1

## 2023-10-06 MED ORDER — ATORVASTATIN CALCIUM 80 MG PO TABS
80.0000 mg | ORAL_TABLET | Freq: Every day | ORAL | Status: DC
Start: 2023-10-06 — End: 2023-10-07
  Administered 2023-10-06 – 2023-10-07 (×2): 80 mg via ORAL
  Filled 2023-10-06: qty 1
  Filled 2023-10-06: qty 4

## 2023-10-06 MED ORDER — HYDROCORTISONE 1 % EX CREA
TOPICAL_CREAM | Freq: Three times a day (TID) | CUTANEOUS | Status: DC
  Filled 2023-10-06 (×2): qty 28

## 2023-10-06 MED ORDER — POTASSIUM CHLORIDE CRYS ER 20 MEQ PO TBCR
20.0000 meq | EXTENDED_RELEASE_TABLET | Freq: Every day | ORAL | Status: DC
  Administered 2023-10-06 – 2023-10-07 (×2): 20 meq via ORAL
  Filled 2023-10-06 (×2): qty 1

## 2023-10-06 NOTE — Consult Note (Signed)
 [facility] CARDIOLOGY CONSULT NOTE       Patient ID: Christian Clark MRN: [id number] DOB/AGE: [birth year] 77 y.o.  Admit date: 10/05/2023 Referring Physician Dr. Tobie Primary Physician Lenon Layman ORN, MD Primary Cardiologist Dr. Florencio (2023) Reason for Consultation Acute HF exacerbation  HPI: Christian Clark is a 78 y.o. male  with a past medical history of coronary artery disease, hypertension, chronic atrial fibrillation (On Eliquis ), hx CVA, OSA (not on CPAP), Hx PE, alcohol use, hx substance abuse, frequent falls who presented to the ED on 10/05/2023 for significant lower exetrmity edema for past 2 weeks. Denies SOB, orthopnea or CP. Endorses decreased appetite and mild abdominal bloating. Patient has hx noncompliant with treatment  Patient denies hx of HF. Cardiology was consulted for further evaluation.   Work up in the ED notable for Na 140, K 3.6, Cr 0.93, Hgb 10.9, plts 197. Trops minimally elevated and flat 29 > 29. EKG with atrial fibrillation, rate 65 bpm. BNP elevated at 230 in setting of obesity. CXR with trace right pleural effusion and pulmonary vascular congestion. Echo this admission revealed EF 50-55%, low normal function, mild LVH, mild to moderate MS. Patient has received 2x of IV lasix  40 mg.   At the time of my evaluation this afternoon, patient was resting comfortably in ED stretcher. We discussed patients sxs in further detail. Patient states for the past 2 weeks he's been having worsening lower extremity edema and endorses decreased appetite and mild abdominal bloating. Patient denies any chest pain, palpitations, Sob or orthopnea. Patient states he feels a lot better s/p IV lasix  and reports great UOP. Patient reports LEE has significantly improved as well as abdominal bloating.  Patient states 3 years ago he had issues with LEE and lasix  also helped a lot at that time. Patient denies hx of heart failure. Patient states he can walk with walker and  never has exertional SOB or CP. However, patient reports he chooses not to walk because of his right knee pain requiring surgery. Patient states he has home health nurses that perform all of his ADLs and manage his medications.     Review of systems complete and found to be negative unless listed above    Past Medical History:  Diagnosis Date   GERD (gastroesophageal reflux disease)    Substance abuse (HCC)     Past Surgical History:  Procedure Laterality Date   JOINT REPLACEMENT      (Not in a hospital admission)  Social History   Socioeconomic History   Marital status: Divorced    Spouse name: Not on file   Number of children: 2   Years of education: Not on file   Highest education level: Not on file  Occupational History   Not on file  Tobacco Use   Smoking status: Former    Current packs/day: 0.00    Average packs/day: 1 pack/day for 20.0 years (20.0 ttl pk-yrs)    Types: Cigarettes    Start date: 86    Quit date: 2010    Years since quitting: 15.7   Smokeless tobacco: Never  Vaping Use   Vaping status: Never Used  Substance and Sexual Activity   Alcohol use: Not Currently   Drug use: Never   Sexual activity: Not on file  Other Topics Concern   Not on file  Social History Narrative   Not on file   Social Drivers of Health   Financial Resource Strain: Not on file  Food Insecurity: Not  on file  Transportation Needs: Not on file  Physical Activity: Not on file  Stress: Not on file  Social Connections: Not on file  Intimate Partner Violence: Not on file    Family History  Problem Relation Age of Onset   Atrial fibrillation Sister      Vitals:   10/06/23 0930 10/06/23 0945 10/06/23 1000 10/06/23 1200  BP:   (!) 95/52 117/73  Pulse: 61 (!) 27 (!) 52 (!) 53  Resp: (!) 22 (!) 27 (!) 22 19  Temp:      TempSrc:      SpO2: 95% 91% 95% 94%  Weight:      Height:        PHYSICAL EXAM General: Chronically ill appearing elderly male, well nourished, in  no acute distress. HEENT: Normocephalic and atraumatic. Neck: No JVD.   Lungs: Normal respiratory effort on room air. Diminished breath sounds bilaterally.  Heart: Irregularly, irregular, controlled rate. Normal S1 and S2 without gallops or murmurs.  Abdomen: Non-distended appearing.  Msk: Normal strength and tone for age. Extremities: Warm and well perfused. No clubbing, cyanosis. + pedal edema with wrinkles (much improved).  Neuro: Alert and oriented X 3. Psych: Answers questions appropriately.   Labs: Basic Metabolic Panel: Recent Labs    10/05/23 1731 10/06/23 0332  NA 140 139  K 3.6 3.2*  CL 103 101  CO2 24 25  GLUCOSE 138* 98  BUN 12 10  CREATININE 0.93 0.87  CALCIUM  9.1 8.1*   Liver Function Tests: No results for input(s): AST, ALT, ALKPHOS, BILITOT, PROT, ALBUMIN in the last 72 hours. No results for input(s): LIPASE, AMYLASE in the last 72 hours. CBC: Recent Labs    10/05/23 1731  WBC 8.4  HGB 10.9*  HCT 35.4*  MCV 96.7  PLT 197   Cardiac Enzymes: Recent Labs    10/05/23 1731 10/05/23 2120  TROPONINIHS 29* 29*   BNP: Recent Labs    10/05/23 1731  BNP 236.9*   D-Dimer: No results for input(s): DDIMER in the last 72 hours. Hemoglobin A1C: No results for input(s): HGBA1C in the last 72 hours. Fasting Lipid Panel: No results for input(s): CHOL, HDL, LDLCALC, TRIG, CHOLHDL, LDLDIRECT in the last 72 hours. Thyroid Function Tests: No results for input(s): TSH, T4TOTAL, T3FREE, THYROIDAB in the last 72 hours.  Invalid input(s): FREET3 Anemia Panel: No results for input(s): VITAMINB12, FOLATE, FERRITIN, TIBC, IRON , RETICCTPCT in the last 72 hours.   Radiology: ECHOCARDIOGRAM COMPLETE Result Date: 10/06/2023    ECHOCARDIOGRAM REPORT   Patient Name:   Christian Clark Date of Exam: 10/06/2023 Medical Rec #:  [id number]            Height:       70.0 in Accession #:    [id number]           Weight:        246.0 lb Date of Birth:  October 28, 1945            BSA:          2.279 m Patient Age:    77 years             BP:           128/66 mmHg Patient Gender: M                    HR:           55 bpm. Exam Location:  [facility] Procedure: 2D Echo, Cardiac  Doppler and Color Doppler (Both Spectral and Color            Flow Doppler were utilized during procedure). Indications:     CHF-Acute Systolic I50.21  History:         Patient has prior history of Echocardiogram examinations, most                  recent 06/03/2021. Mitral valve replacement; Mitral Valve                  Disease.  Sonographer:     Ashley McNeely-Sloane Referring Phys:  [id number] EMERY LITTIE FUSS Diagnosing Phys: Timothy Gollan MD IMPRESSIONS  1. Left ventricular ejection fraction, by estimation, is 50 to 55%. The left ventricle has low normal function. The left ventricle has no regional wall motion abnormalities. There is mild left ventricular hypertrophy. Left ventricular diastolic parameters are indeterminate. The global longitudinal strain is abnormal.  2. Right ventricular systolic function is normal. The right ventricular size is normal. Tricuspid regurgitation signal is inadequate for assessing PA pressure.  3. The mitral valve is normal in structure. No evidence of mitral valve regurgitation. Mild to moderate mitral stenosis. The mean mitral valve gradient is 7.0 mmHg. Moderate mitral annular calcification.  4. The aortic valve is normal in structure. Aortic valve regurgitation is not visualized. Aortic valve sclerosis is present, with no evidence of aortic valve stenosis. Aortic valve mean gradient measures 7.0 mmHg.  5. There is borderline dilatation of the aortic root, measuring 39 mm.  6. The inferior vena cava is normal in size with greater than 50% respiratory variability, suggesting right atrial pressure of 3 mmHg. FINDINGS  Left Ventricle: Left ventricular ejection fraction, by estimation, is 50 to 55%. The left ventricle has low normal function. The  left ventricle has no regional wall motion abnormalities. Strain was performed and the global longitudinal strain is abnormal. The left ventricular internal cavity size was normal in size. There is mild left ventricular hypertrophy. Left ventricular diastolic parameters are indeterminate. Right Ventricle: The right ventricular size is normal. No increase in right ventricular wall thickness. Right ventricular systolic function is normal. Tricuspid regurgitation signal is inadequate for assessing PA pressure. Left Atrium: Left atrial size was normal in size. Right Atrium: Right atrial size was normal in size. Pericardium: There is no evidence of pericardial effusion. Mitral Valve: The mitral valve is normal in structure. There is moderate calcification of the mitral valve leaflet(s). Moderate mitral annular calcification. No evidence of mitral valve regurgitation. Mild to moderate mitral valve stenosis. MV peak gradient, 12.7 mmHg. The mean mitral valve gradient is 7.0 mmHg. Tricuspid Valve: The tricuspid valve is normal in structure. Tricuspid valve regurgitation is not demonstrated. No evidence of tricuspid stenosis. Aortic Valve: The aortic valve is normal in structure. Aortic valve regurgitation is not visualized. Aortic valve sclerosis is present, with no evidence of aortic valve stenosis. Aortic valve mean gradient measures 7.0 mmHg. Aortic valve peak gradient measures 13.0 mmHg. Aortic valve area, by VTI measures 1.75 cm. Pulmonic Valve: The pulmonic valve was normal in structure. Pulmonic valve regurgitation is not visualized. No evidence of pulmonic stenosis. Aorta: The aortic root is normal in size and structure. There is borderline dilatation of the aortic root, measuring 39 mm. Venous: The inferior vena cava is normal in size with greater than 50% respiratory variability, suggesting right atrial pressure of 3 mmHg. IAS/Shunts: No atrial level shunt detected by color flow Doppler. Additional Comments: 3D  was performed not  requiring image post processing on an independent workstation and was indeterminate.  LEFT VENTRICLE PLAX 2D LVIDd:         4.90 cm      Diastology LVIDs:         3.40 cm      LV e' medial:    6.24 cm/s LV PW:         1.30 cm      LV E/e' medial:  26.8 LV IVS:        1.30 cm      LV e' lateral:   6.08 cm/s LVOT diam:     2.35 cm      LV E/e' lateral: 27.5 LV SV:         69 LV SV Index:   30 LVOT Area:     4.34 cm  LV Volumes (MOD) LV vol d, MOD A2C: 121.0 ml LV vol d, MOD A4C: 149.0 ml LV vol s, MOD A2C: 66.9 ml LV vol s, MOD A4C: 52.0 ml LV SV MOD A2C:     54.1 ml LV SV MOD A4C:     149.0 ml LV SV MOD BP:      82.1 ml RIGHT VENTRICLE            IVC RV Basal diam:  4.73 cm    IVC diam: 2.80 cm RV Mid diam:    3.70 cm RV S prime:     7.72 cm/s TAPSE (M-mode): 1.7 cm LEFT ATRIUM              Index        RIGHT ATRIUM           Index LA Vol (A2C):   113.0 ml 49.58 ml/m  RA Area:     31.30 cm LA Vol (A4C):   59.1 ml  25.93 ml/m  RA Volume:   127.00 ml 55.72 ml/m LA Biplane Vol: 88.0 ml  38.61 ml/m  AORTIC VALVE                     PULMONIC VALVE AV Area (Vmax):    1.78 cm      PV Vmax:        1.14 m/s AV Area (Vmean):   1.58 cm      PV Vmean:       74.100 cm/s AV Area (VTI):     1.75 cm      PV VTI:         0.218 m AV Vmax:           180.00 cm/s   PV Peak grad:   5.2 mmHg AV Vmean:          123.000 cm/s  PV Mean grad:   3.0 mmHg AV VTI:            0.392 m       RVOT Peak grad: 1 mmHg AV Peak Grad:      13.0 mmHg AV Mean Grad:      7.0 mmHg LVOT Vmax:         73.90 cm/s LVOT Vmean:        44.900 cm/s LVOT VTI:          0.158 m LVOT/AV VTI ratio: 0.40  AORTA Ao Root diam: 4.25 cm MITRAL VALVE MV Area (PHT): 1.55 cm     SHUNTS MV Area VTI:   1.36 cm     Systemic VTI:  0.16 m MV Peak  grad:  12.7 mmHg    Systemic Diam: 2.35 cm MV Mean grad:  7.0 mmHg     Pulmonic VTI:  0.131 m MV Vmax:       1.78 m/s MV Vmean:      122.0 cm/s MV Decel Time: 489 msec MV E velocity: 167.00 cm/s Evalene Lunger MD  Electronically signed by Evalene Lunger MD Signature Date/Time: 10/06/2023/12:38:16 PM    Final    DG Chest 2 View Result Date: 10/05/2023 CLINICAL DATA:  hypoxia EXAM: CHEST - 2 VIEW COMPARISON:  Chest x-ray 11/28/2020 FINDINGS: Patient is rotated. The heart and mediastinal contours are within normal limits. Mitral annular replacement. Right base patchy airspace opacity. No pulmonary edema. Trace right pleural effusion. No pneumothorax. No acute osseous abnormality.  Sternotomy wires are intact. IMPRESSION: Right base patchy airspace opacity with trace right pleural effusion. Electronically Signed   By: Morgane  Naveau M.D.   On: 10/05/2023 18:04    ECHO as above.  TELEMETRY reviewed by me 10/06/2023: atrial fibrillation, rate 50-60s  EKG reviewed by me: atrial fibrillation, rate 65 bpm.  Data reviewed by me 10/06/2023: last 24h vitals tele labs imaging I/O ED provider note, admission H&P.  Principal Problem:   Acute exacerbation of CHF (congestive heart failure) (HCC) Active Problems:   Atrial fibrillation, chronic (HCC)   Alcohol abuse   Iron  deficiency anemia due to chronic blood loss   GERD (gastroesophageal reflux disease)   Chronic systolic CHF (congestive heart failure) (HCC)    ASSESSMENT AND PLAN:  Gavyn Day Brosky is a 78 y.o. male  with a past medical history of coronary artery disease, hypertension, chronic atrial fibrillation (On Eliquis ), hx CVA, OSA (not on CPAP), Hx PE, alcohol use, hx substance abuse, frequent falls who presented to the ED on 10/05/2023 for significant lower exetrmity edema for past 2 weeks. Denies SOB, orthopnea or CP. Endorses decreased appetite and mild abdominal bloating. Patient has hx noncompliant with treatment  Patient denies hx of HF. Cardiology was consulted for further evaluation.   # Acute heart failure, mild systolic dysfunction  Echo this admission with low normal EF (50-55%), BNP elevated at 230 in setting of obesity. CXR with trace right  pleural effusion and pulmonary vascular congestion.  -Monitor and replenish electrolytes for a goal K >4, Mag >2  -Continue IV lasix  40 mg BID. Closely monitor UOP and renal function. Likely transition to PO lasix  40 mg daily tomorrow.  -Continue dapagliflozin  10 mg daily. -Continue home Coreg  25 mg BID. -Consider starting spirolactone tomorrow if BP/renal function allows.  -Consider ARB as BP allows.  -Will plan to optimize GDMT as BP and renal function allows.   # Coronary artery disease # Hypertension # Hyperlipidemia -Continue atorvastatin  80 mg daily.  -Continue Coreg  as stated above.  -Minimally elevated and flat in setting of acute HF is most consistent with demand/supply mismatch and not ACS. No further cardiac diagnostics at this time.  # Chronic Atrial Fibrillation # Hx CVA # OSA (refuses CPAP) -Continue Eliquis  5 mg BID for stroke risk reduction. -Continue Coreg  as stated above.   This patient's plan of care was discussed and created with Dr. Custovic and she is in agreement.  Signed: Dorene Comfort, PA-C  10/06/2023, 3:08 PM [facility]

## 2023-10-06 NOTE — Progress Notes (Signed)
 [facility]  - [place] at [facility]   PATIENT NAME: Christian Clark    MR#:  [id number]  DATE OF BIRTH:  May 17, 1945  SUBJECTIVE:  patient's home health aide at bedside. Overall feels a lot better. Tells me my legs or looking so much better. Good urine output so far according to our in more than 3 L. Patient denies shortness of breath or chest pain tolerating PO diet    VITALS:  Blood pressure 117/73, pulse (!) 53, temperature 97.9 F (36.6 C), temperature source Oral, resp. rate 19, height 5' 10 (1.778 m), weight 111.6 kg, SpO2 94%.  PHYSICAL EXAMINATION:   GENERAL:  78 y.o.-year-old patient with no acute distress. Obese LUNGS: Normal breath sounds bilaterally, no wheezing CARDIOVASCULAR: S1, S2 normal. No murmur   ABDOMEN: Soft, nontender, nondistended. Bowel sounds present.  EXTREMITIES: chronic bilateral edema b/l.   -- Improving edema NEUROLOGIC: nonfocal  patient is alert and awake   LABORATORY PANEL:  CBC Recent Labs  Lab 10/05/23 1731  WBC 8.4  HGB 10.9*  HCT 35.4*  PLT 197    Chemistries  Recent Labs  Lab 10/06/23 0332  NA 139  K 3.2*  CL 101  CO2 25  GLUCOSE 98  BUN 10  CREATININE 0.87  CALCIUM  8.1*   Cardiac Enzymes No results for input(s): TROPONINI in the last 168 hours. RADIOLOGY:  ECHOCARDIOGRAM COMPLETE Result Date: 10/06/2023    ECHOCARDIOGRAM REPORT   Patient Name:   Christian Clark Duet Date of Exam: 10/06/2023 Medical Rec #:  [id number]            Height:       70.0 in Accession #:    [id number]           Weight:       246.0 lb Date of Birth:  1945-07-07            BSA:          2.279 m Patient Age:    78 years             BP:           128/66 mmHg Patient Gender: M                    HR:           55 bpm. Exam Location:  [facility] Procedure: 2D Echo, Cardiac Doppler and Color Doppler (Both Spectral and Color            Flow Doppler were utilized during procedure). Indications:     CHF-Acute Systolic I50.21  History:          Patient has prior history of Echocardiogram examinations, most                  recent 06/03/2021. Mitral valve replacement; Mitral Valve                  Disease.  Sonographer:     Ashley McNeely-Sloane Referring Phys:  [id number] EMERY LITTIE FUSS Diagnosing Phys: Timothy Gollan MD IMPRESSIONS  1. Left ventricular ejection fraction, by estimation, is 50 to 55%. The left ventricle has low normal function. The left ventricle has no regional wall motion abnormalities. There is mild left ventricular hypertrophy. Left ventricular diastolic parameters are indeterminate. The global longitudinal strain is abnormal.  2. Right ventricular systolic function is normal. The right ventricular size is normal. Tricuspid regurgitation signal is inadequate for assessing PA pressure.  3. The  mitral valve is normal in structure. No evidence of mitral valve regurgitation. Mild to moderate mitral stenosis. The mean mitral valve gradient is 7.0 mmHg. Moderate mitral annular calcification.  4. The aortic valve is normal in structure. Aortic valve regurgitation is not visualized. Aortic valve sclerosis is present, with no evidence of aortic valve stenosis. Aortic valve mean gradient measures 7.0 mmHg.  5. There is borderline dilatation of the aortic root, measuring 39 mm.  6. The inferior vena cava is normal in size with greater than 50% respiratory variability, suggesting right atrial pressure of 3 mmHg. FINDINGS  Left Ventricle: Left ventricular ejection fraction, by estimation, is 50 to 55%. The left ventricle has low normal function. The left ventricle has no regional wall motion abnormalities. Strain was performed and the global longitudinal strain is abnormal. The left ventricular internal cavity size was normal in size. There is mild left ventricular hypertrophy. Left ventricular diastolic parameters are indeterminate. Right Ventricle: The right ventricular size is normal. No increase in right ventricular wall thickness. Right ventricular  systolic function is normal. Tricuspid regurgitation signal is inadequate for assessing PA pressure. Left Atrium: Left atrial size was normal in size. Right Atrium: Right atrial size was normal in size. Pericardium: There is no evidence of pericardial effusion. Mitral Valve: The mitral valve is normal in structure. There is moderate calcification of the mitral valve leaflet(s). Moderate mitral annular calcification. No evidence of mitral valve regurgitation. Mild to moderate mitral valve stenosis. MV peak gradient, 12.7 mmHg. The mean mitral valve gradient is 7.0 mmHg. Tricuspid Valve: The tricuspid valve is normal in structure. Tricuspid valve regurgitation is not demonstrated. No evidence of tricuspid stenosis. Aortic Valve: The aortic valve is normal in structure. Aortic valve regurgitation is not visualized. Aortic valve sclerosis is present, with no evidence of aortic valve stenosis. Aortic valve mean gradient measures 7.0 mmHg. Aortic valve peak gradient measures 13.0 mmHg. Aortic valve area, by VTI measures 1.75 cm. Pulmonic Valve: The pulmonic valve was normal in structure. Pulmonic valve regurgitation is not visualized. No evidence of pulmonic stenosis. Aorta: The aortic root is normal in size and structure. There is borderline dilatation of the aortic root, measuring 39 mm. Venous: The inferior vena cava is normal in size with greater than 50% respiratory variability, suggesting right atrial pressure of 3 mmHg. IAS/Shunts: No atrial level shunt detected by color flow Doppler. Additional Comments: 3D was performed not requiring image post processing on an independent workstation and was indeterminate.  LEFT VENTRICLE PLAX 2D LVIDd:         4.90 cm      Diastology LVIDs:         3.40 cm      LV e' medial:    6.24 cm/s LV PW:         1.30 cm      LV E/e' medial:  26.8 LV IVS:        1.30 cm      LV e' lateral:   6.08 cm/s LVOT diam:     2.35 cm      LV E/e' lateral: 27.5 LV SV:         69 LV SV Index:   30  LVOT Area:     4.34 cm  LV Volumes (MOD) LV vol d, MOD A2C: 121.0 ml LV vol d, MOD A4C: 149.0 ml LV vol s, MOD A2C: 66.9 ml LV vol s, MOD A4C: 52.0 ml LV SV MOD A2C:     54.1 ml  LV SV MOD A4C:     149.0 ml LV SV MOD BP:      82.1 ml RIGHT VENTRICLE            IVC RV Basal diam:  4.73 cm    IVC diam: 2.80 cm RV Mid diam:    3.70 cm RV S prime:     7.72 cm/s TAPSE (M-mode): 1.7 cm LEFT ATRIUM              Index        RIGHT ATRIUM           Index LA Vol (A2C):   113.0 ml 49.58 ml/m  RA Area:     31.30 cm LA Vol (A4C):   59.1 ml  25.93 ml/m  RA Volume:   127.00 ml 55.72 ml/m LA Biplane Vol: 88.0 ml  38.61 ml/m  AORTIC VALVE                     PULMONIC VALVE AV Area (Vmax):    1.78 cm      PV Vmax:        1.14 m/s AV Area (Vmean):   1.58 cm      PV Vmean:       74.100 cm/s AV Area (VTI):     1.75 cm      PV VTI:         0.218 m AV Vmax:           180.00 cm/s   PV Peak grad:   5.2 mmHg AV Vmean:          123.000 cm/s  PV Mean grad:   3.0 mmHg AV VTI:            0.392 m       RVOT Peak grad: 1 mmHg AV Peak Grad:      13.0 mmHg AV Mean Grad:      7.0 mmHg LVOT Vmax:         73.90 cm/s LVOT Vmean:        44.900 cm/s LVOT VTI:          0.158 m LVOT/AV VTI ratio: 0.40  AORTA Ao Root diam: 4.25 cm MITRAL VALVE MV Area (PHT): 1.55 cm     SHUNTS MV Area VTI:   1.36 cm     Systemic VTI:  0.16 m MV Peak grad:  12.7 mmHg    Systemic Diam: 2.35 cm MV Mean grad:  7.0 mmHg     Pulmonic VTI:  0.131 m MV Vmax:       1.78 m/s MV Vmean:      122.0 cm/s MV Decel Time: 489 msec MV E velocity: 167.00 cm/s Evalene Lunger MD Electronically signed by Evalene Lunger MD Signature Date/Time: 10/06/2023/12:38:16 PM    Final    DG Chest 2 View Result Date: 10/05/2023 CLINICAL DATA:  hypoxia EXAM: CHEST - 2 VIEW COMPARISON:  Chest x-ray 11/28/2020 FINDINGS: Patient is rotated. The heart and mediastinal contours are within normal limits. Mitral annular replacement. Right base patchy airspace opacity. No pulmonary edema. Trace right  pleural effusion. No pneumothorax. No acute osseous abnormality.  Sternotomy wires are intact. IMPRESSION: Right base patchy airspace opacity with trace right pleural effusion. Electronically Signed   By: Morgane  Naveau M.D.   On: 10/05/2023 18:04    Assessment and Plan  Normon Day Seals is a 78 y.o. male with medical history significant of history of alcohol abuse, history  of substance abuse, atrial fibrillation, frequent falls, history of CVA, chronic systolic heart failure, iron  deficiency anemia who presented with significant lower extremity edema, shortness of breath with exertion, overall anasarca.  Patient has apparently been noncompliant with treatment.  He was seen and evaluated in the ER.  Appears to have fluid overload   Pt's home health Aide is at bedside.  Acute heart failure, mild systolic dysfunction -- patient currently on IV Lasix  40 mg BID. Good urine output. Decreased leg edema. Patient breathing comfortably and on room air -- [facility] cardiology consultation appreciated. Transition to PO Lasix  tomorrow --Continue dapagliflozin  10 mg daily. -Continue home Coreg  25 mg BID. -Consider starting spirolactone tomorrow if BP/renal function allows.  -- GDM T as outpatient with BP and renal function monitoring  Chronic atrial fibrillation -- rate controlled -- continue eliquis  -- continue beta-blockers  History of alcohol use -- patient advised to cut down on his intake -- no signs of withdrawal  Hypertension -- continue meds as above  CAD, hyperlipidemia -- continue statins -- patient denies any chest pain  At baseline patient ambulates around using her walker. He is private home health at home already arranged   Procedures: Family communication :HH aide Consults : [facility] cardiology CODE STATUS: full DVT Prophylaxis : eliquis  Level of care: Telemetry Cardiac Status is: Inpatient Remains inpatient appropriate because: acute CHF continue IV diuretics    TOTAL TIME  TAKING CARE OF THIS PATIENT: 40 minutes.  >50% time spent on counselling and coordination of care  Note: This dictation was prepared with Dragon dictation along with smaller phrase technology. Any transcriptional errors that result from this process are unintentional.  Leita Blanch M.D    [facility]   CC: Primary care physician; Lenon Layman ORN, MD

## 2023-10-07 DIAGNOSIS — I5023 Acute on chronic systolic (congestive) heart failure: Secondary | ICD-10-CM | POA: Diagnosis not present

## 2023-10-07 DIAGNOSIS — K219 Gastro-esophageal reflux disease without esophagitis: Secondary | ICD-10-CM | POA: Diagnosis not present

## 2023-10-07 DIAGNOSIS — I482 Chronic atrial fibrillation, unspecified: Secondary | ICD-10-CM | POA: Diagnosis not present

## 2023-10-07 DIAGNOSIS — F1010 Alcohol abuse, uncomplicated: Secondary | ICD-10-CM | POA: Diagnosis not present

## 2023-10-07 LAB — BASIC METABOLIC PANEL WITH GFR
Anion gap: 9 (ref 5–15)
BUN: 18 mg/dL (ref 8–23)
CO2: 28 mmol/L (ref 22–32)
Calcium: 8.6 mg/dL — ABNORMAL LOW (ref 8.9–10.3)
Chloride: 103 mmol/L (ref 98–111)
Creatinine, Ser: 1.34 mg/dL — ABNORMAL HIGH (ref 0.61–1.24)
GFR, Estimated: 55 mL/min — ABNORMAL LOW (ref >60)
Glucose, Bld: 120 mg/dL — ABNORMAL HIGH (ref 70–99)
Potassium: 3.7 mmol/L (ref 3.5–5.1)
Sodium: 140 mmol/L (ref 135–145)

## 2023-10-07 MED ORDER — DAPAGLIFLOZIN PROPANEDIOL 10 MG PO TABS: 10.0000 mg | ORAL_TABLET | Freq: Every day | ORAL | 2 refills | Status: DC

## 2023-10-07 MED ORDER — FUROSEMIDE 20 MG PO TABS
20.0000 mg | ORAL_TABLET | Freq: Every day | ORAL | Status: DC
Start: 2023-10-08 — End: 2023-10-07

## 2023-10-07 MED ORDER — FUROSEMIDE 40 MG PO TABS: 40.0000 mg | ORAL_TABLET | Freq: Every day | ORAL | Status: DC

## 2023-10-07 MED ORDER — HYDROCORTISONE 1 % EX CREA: TOPICAL_CREAM | Freq: Three times a day (TID) | CUTANEOUS | 0 refills | Status: DC

## 2023-10-07 MED ORDER — CARVEDILOL 25 MG PO TABS: 25.0000 mg | ORAL_TABLET | Freq: Two times a day (BID) | ORAL | 2 refills | Status: AC

## 2023-10-07 MED ORDER — FUROSEMIDE 20 MG PO TABS: 20.0000 mg | ORAL_TABLET | Freq: Every day | ORAL | 1 refills | Status: AC

## 2023-10-07 MED ORDER — FUROSEMIDE 20 MG PO TABS: 20.0000 mg | ORAL_TABLET | Freq: Every day | ORAL | Status: DC

## 2023-10-07 NOTE — Evaluation (Signed)
 Physical Therapy Evaluation Patient Details Name: Christian Clark MRN: [id number] DOB: 24-Sep-1945 Today's Date: 10/07/2023  History of Present Illness  Pt is a 78 y.o. male with medical history significant of history of alcohol abuse, history of substance abuse, atrial fibrillation, frequent falls, history of CVA, chronic systolic heart failure, iron  deficiency anemia who presented with significant lower extremity edema, shortness of breath with exertion, overall anasarca.  Admitted with acute exacerbation of CHF.  Clinical Impression  Pt is 77y/o male patient admitted with acute exacerbation of CHF. Prior to hospitalization, pt used RW for ambulation and has a HH aide who helps with ADLs and IADLs 7days/wk for 8 hours/day. Pt able to ambulate 235ft while using RW with CGA for safety. SpO2 and HR monitored throughout and stayed WNLs. Pt able to perform head turns while ambulating however demonstrated difficulty with neck extension while ambulating requiring him to stop when assessing this aspect of balance. No LOB observed with functional mobility.  Pt demonstrates all bed mobility/transfers/ambulation at baseline level. Pt does not require any further PT needs at this time. Pt will be dc in house and does not require follow up. RN aware. Will dc current orders.       If plan is discharge home, recommend the following: A little help with bathing/dressing/bathroom;Assistance with cooking/housework;Help with stairs or ramp for entrance   Can travel by private vehicle        Equipment Recommendations None recommended by PT  Recommendations for Other Services       Functional Status Assessment Patient has not had a recent decline in their functional status     Precautions / Restrictions Precautions Precautions: Fall Recall of Precautions/Restrictions: Intact Restrictions Weight Bearing Restrictions Per Provider Order: No      Mobility  Bed Mobility               General  bed mobility comments: NT. Pt received in recliner pre/post session    Transfers Overall transfer level: Modified independent Equipment used: Rolling walker (2 wheels) Transfers: Sit to/from Stand, Bed to chair/wheelchair/BSC Sit to Stand: Supervision   Step pivot transfers: Supervision       General transfer comment: Able to perform multiple STS during session to ambulate and for gown change.    Ambulation/Gait Ambulation/Gait assistance: Modified independent (Device/Increase time) Gait Distance (Feet): 200 Feet Assistive device: Rolling walker (2 wheels) Gait Pattern/deviations: WFL(Within Functional Limits), Step-through pattern       General Gait Details: Pt able to perform head turns while ambulating. Mild impairment with neck extension while ambulating; pt needed to stop to look up and was unable to continue forward. No LOB. Pt reporting that velocity walked in hallways is faster than his norm.  Stairs            Wheelchair Mobility     Tilt Bed    Modified Rankin (Stroke Patients Only)       Balance Overall balance assessment: Modified Independent   Sitting balance-[name]: Normal       Standing balance-[name]: Fair                               Pertinent Vitals/Pain Pain Assessment Pain Assessment: No/denies pain    Home Living Family/patient expects to be discharged to:: Private residence Living Arrangements: Alone Available Help at Discharge: Personal care attendant Type of Home: House Home Access: Stairs to enter Entrance Stairs-Rails: Right Entrance Stairs-Number of Steps:  2   Home Layout: One level Home Equipment: Agricultural consultant (2 wheels);BSC/3in1;Shower seat Additional Comments: RW at basedline as of 3-4 months ago    Prior Function Prior Level of Function : Needs assist             Mobility Comments: uses RW for ambulation ADLs Comments: Pt reports HHA assists with all IADLs and provides Min A for LB  ADLs and bathing tasks.     Extremity/Trunk Assessment   Upper Extremity Assessment Upper Extremity Assessment: Generalized weakness    Lower Extremity Assessment Lower Extremity Assessment: Defer to PT evaluation    Cervical / Trunk Assessment Cervical / Trunk Assessment: Normal  Communication   Communication Communication: No apparent difficulties    Cognition Arousal: Alert Behavior During Therapy: WFL for tasks assessed/performed   PT - Cognitive impairments: No apparent impairments                       PT - Cognition Comments: pleasant and agreeable to PT session Following commands: Intact       Cueing Cueing Techniques: Verbal cues     General Comments General comments (skin integrity, edema, etc.): monitored HR and SpO2 while ambulating. Pt stayed WNLs throughout session.    Exercises     Assessment/Plan    PT Assessment Patient does not need any further PT services  PT Problem List         PT Treatment Interventions      PT Goals (Current goals can be found in the Care Plan section)  Acute Rehab PT Goals Patient Stated Goal: to go home PT Goal Formulation: With patient Time For Goal Achievement: 10/21/23 Potential to Achieve Goals: Good    Frequency       Co-evaluation               AM-PAC PT 6 Clicks Mobility  Outcome Measure Help needed turning from your back to your side while in a flat bed without using bedrails?: None Help needed moving from lying on your back to sitting on the side of a flat bed without using bedrails?: None Help needed moving to and from a bed to a chair (including a wheelchair)?: None Help needed standing up from a chair using your arms (e.g., wheelchair or bedside chair)?: A Little Help needed to walk in hospital room?: None Help needed climbing 3-5 steps with a railing? : A Little 6 Click Score: 22    End of Session Equipment Utilized During Treatment: Gait belt Activity Tolerance: Patient  tolerated treatment well Patient left: in chair;with call bell/phone within reach;with chair alarm set Nurse Communication: Mobility status PT Visit Diagnosis: Muscle weakness (generalized) (M62.81)    Time: 9057-8996 PT Time Calculation (min) (ACUTE ONLY): 21 min   Charges:                 Zein Helbing, SPT   Lyrik Dockstader 10/07/2023, 12:00 PM

## 2023-10-07 NOTE — Discharge Summary (Signed)
 Physician Discharge Summary   Patient: Christian Clark MRN: [id number] DOB: 1945/03/07  Admit date:     10/05/2023  Discharge date: 10/07/23  Discharge Physician: Leita Blanch   PCP: Christian Layman ORN, MD   Recommendations at discharge:   follow-up [facility] cardiology on your appointment. Patient will follow-up with Dr. Lenon on schedule appointment to establish PCP in the area  Discharge Diagnoses: Principal Problem:   Acute exacerbation of CHF (congestive heart failure) (HCC) Active Problems:   Alcohol abuse   GERD (gastroesophageal reflux disease)   Chronic systolic CHF (congestive heart failure) (HCC)   Atrial fibrillation, chronic (HCC)   Iron  deficiency anemia due to chronic blood loss  Christian Clark is a 78 y.o. male with medical history significant of history of alcohol abuse, history of substance abuse, atrial fibrillation, frequent falls, history of CVA, chronic systolic heart failure, iron  deficiency anemia who presented with significant lower extremity edema, shortness of breath with exertion, overall anasarca.  Patient has apparently been noncompliant with treatment.  He was seen and evaluated in the ER.  Appears to have fluid overload    Pt's home health Aide is at bedside.   Acute heart failure, mild systolic dysfunction -- patient currently on IV Lasix  40 mg BID. Good urine output. Decreased leg edema. Patient breathing comfortably and on room air -- [facility] cardiology consultation appreciated. Transition to PO Lasix  tomorrow --Continue dapagliflozin  10 mg daily. -Continue home Coreg  25 mg BID. -Consider starting spirolactone tomorrow if BP/renal function allows.  -- GDM T as outpatient with BP and renal function monitoring -- good urine output. Switch to oral Lasix  from tomorrow. -- Dietitian input appreciated for heart failure diet education  Chronic atrial fibrillation -- rate controlled -- continue eliquis  -- continue beta-blockers   History of  alcohol use -- patient advised to cut down on his intake -- no signs of withdrawal   Hypertension -- continue meds as above   CAD, hyperlipidemia -- continue statins -- patient denies any chest pain   At baseline patient ambulates around using her walker. He has private home health at home already arranged.  Patient ambulated with mobility therapist around the nurses station using walker. Did fairly well. Discharge plan discussed with patient and home health care agency staff. Voiced understanding.     Procedures: Family communication :HH agency staff Consults : [facility] cardiology CODE STATUS: full DVT Prophylaxis : eliquis      Disposition: Home health Diet recommendation:  Discharge Diet Orders (From admission, onward)     Start     Ordered   10/07/23 0000  Diet - low sodium heart healthy        10/07/23 1021           Cardiac diet DISCHARGE MEDICATION: Allergies as of 10/07/2023       Reactions   Shrimp Extract    Chocolate Hives, Itching   Dust Mite Extract    Iodine Hives   Iodine Strong    Peanut Oil Hives, Itching   Penicillins Hives   Shellfish Allergy Hives        Medication List     STOP taking these medications    amLODipine 5 MG tablet Commonly known as: NORVASC   potassium chloride  10 MEQ tablet Commonly known as: KLOR-CON  M       TAKE these medications    acetaminophen  500 MG tablet Commonly known as: TYLENOL  Take 1 tablet (500 mg total) by mouth every 6 (six) hours as needed for moderate  pain or mild pain.   atorvastatin  80 MG tablet Commonly known as: LIPITOR  Take 80 mg by mouth daily.   carvedilol  25 MG tablet Commonly known as: COREG  Take 1 tablet (25 mg total) by mouth 2 (two) times daily with a meal. What changed:  medication strength how much to take when to take this   cetirizine  10 MG tablet Commonly known as: ZYRTEC  Take 1 tablet (10 mg total) by mouth daily.   dapagliflozin  propanediol 10 MG Tabs  tablet Commonly known as: FARXIGA  Take 1 tablet (10 mg total) by mouth daily. Start taking on: October 08, 2023   Eliquis  5 MG Tabs tablet Generic drug: apixaban  Take 1 tablet (5 mg total) by mouth 2 (two) times daily.   esomeprazole  40 MG capsule Commonly known as: NEXIUM  Take 1 capsule (40 mg total) by mouth daily.   furosemide  20 MG tablet Commonly known as: LASIX  Take 1 tablet (20 mg total) by mouth daily. Start taking on: October 08, 2023   hydrocortisone  cream 1 % Apply topically 3 (three) times daily.   traZODone  50 MG tablet Commonly known as: DESYREL  Take 1 tablet (50 mg total) by mouth at bedtime as needed for sleep.        Follow-up Information     Custovic, Annalee, DO. Go in 1 week(s).   Specialty: Cardiology Why: 09/25 at 10:30 AM Contact information: [address] [place] [address] [phone]         Christian Layman ORN, MD. Go to.   Specialty: Internal Medicine Why: to your scheduled appt and establish PCP Contact information: [address] [facility] [place] [address] [phone]                Discharge Exam: Christian Clark   10/05/23 1712 10/07/23 0500  Weight: 111.6 kg 93.4 kg   GENERAL:  78 y.o.-year-old patient with no acute distress. Obese LUNGS: Normal breath sounds bilaterally, no wheezing CARDIOVASCULAR: S1, S2 normal. No murmur   ABDOMEN: Soft, nontender, nondistended. Bowel sounds present.  EXTREMITIES: chronic bilateral edema b/l.   -- Improving edema NEUROLOGIC: nonfocal  patient is alert and awake  Condition at discharge: fair  The results of significant diagnostics from this hospitalization (including imaging, microbiology, ancillary and laboratory) are listed below for reference.   Imaging Studies: ECHOCARDIOGRAM COMPLETE Result Date: 10/06/2023    ECHOCARDIOGRAM REPORT   Patient Name:   Christian Clark Date of Exam: 10/06/2023 Medical Rec #:  [id number]             Height:       70.0 in Accession #:    [id number]           Weight:       246.0 lb Date of Birth:  01-Dec-1945            BSA:          2.279 m Patient Age:    77 years             BP:           128/66 mmHg Patient Gender: M                    HR:           55 bpm. Exam Location:  [facility] Procedure: 2D Echo, Cardiac Doppler and Color Doppler (Both Spectral and Color            Flow Doppler were  utilized during procedure). Indications:     CHF-Acute Systolic I50.21  History:         Patient has prior history of Echocardiogram examinations, most                  recent 06/03/2021. Mitral valve replacement; Mitral Valve                  Disease.  Sonographer:     Ashley McNeely-Sloane Referring Phys:  [id number] EMERY LITTIE FUSS Diagnosing Phys: Timothy Gollan MD IMPRESSIONS  1. Left ventricular ejection fraction, by estimation, is 50 to 55%. The left ventricle has low normal function. The left ventricle has no regional wall motion abnormalities. There is mild left ventricular hypertrophy. Left ventricular diastolic parameters are indeterminate. The global longitudinal strain is abnormal.  2. Right ventricular systolic function is normal. The right ventricular size is normal. Tricuspid regurgitation signal is inadequate for assessing PA pressure.  3. The mitral valve is normal in structure. No evidence of mitral valve regurgitation. Mild to moderate mitral stenosis. The mean mitral valve gradient is 7.0 mmHg. Moderate mitral annular calcification.  4. The aortic valve is normal in structure. Aortic valve regurgitation is not visualized. Aortic valve sclerosis is present, with no evidence of aortic valve stenosis. Aortic valve mean gradient measures 7.0 mmHg.  5. There is borderline dilatation of the aortic root, measuring 39 mm.  6. The inferior vena cava is normal in size with greater than 50% respiratory variability, suggesting right atrial pressure of 3 mmHg. FINDINGS  Left Ventricle: Left ventricular ejection fraction, by  estimation, is 50 to 55%. The left ventricle has low normal function. The left ventricle has no regional wall motion abnormalities. Strain was performed and the global longitudinal strain is abnormal. The left ventricular internal cavity size was normal in size. There is mild left ventricular hypertrophy. Left ventricular diastolic parameters are indeterminate. Right Ventricle: The right ventricular size is normal. No increase in right ventricular wall thickness. Right ventricular systolic function is normal. Tricuspid regurgitation signal is inadequate for assessing PA pressure. Left Atrium: Left atrial size was normal in size. Right Atrium: Right atrial size was normal in size. Pericardium: There is no evidence of pericardial effusion. Mitral Valve: The mitral valve is normal in structure. There is moderate calcification of the mitral valve leaflet(s). Moderate mitral annular calcification. No evidence of mitral valve regurgitation. Mild to moderate mitral valve stenosis. MV peak gradient, 12.7 mmHg. The mean mitral valve gradient is 7.0 mmHg. Tricuspid Valve: The tricuspid valve is normal in structure. Tricuspid valve regurgitation is not demonstrated. No evidence of tricuspid stenosis. Aortic Valve: The aortic valve is normal in structure. Aortic valve regurgitation is not visualized. Aortic valve sclerosis is present, with no evidence of aortic valve stenosis. Aortic valve mean gradient measures 7.0 mmHg. Aortic valve peak gradient measures 13.0 mmHg. Aortic valve area, by VTI measures 1.75 cm. Pulmonic Valve: The pulmonic valve was normal in structure. Pulmonic valve regurgitation is not visualized. No evidence of pulmonic stenosis. Aorta: The aortic root is normal in size and structure. There is borderline dilatation of the aortic root, measuring 39 mm. Venous: The inferior vena cava is normal in size with greater than 50% respiratory variability, suggesting right atrial pressure of 3 mmHg. IAS/Shunts: No  atrial level shunt detected by color flow Doppler. Additional Comments: 3D was performed not requiring image post processing on an independent workstation and was indeterminate.  LEFT VENTRICLE PLAX 2D LVIDd:  4.90 cm      Diastology LVIDs:         3.40 cm      LV e' medial:    6.24 cm/s LV PW:         1.30 cm      LV E/e' medial:  26.8 LV IVS:        1.30 cm      LV e' lateral:   6.08 cm/s LVOT diam:     2.35 cm      LV E/e' lateral: 27.5 LV SV:         69 LV SV Index:   30 LVOT Area:     4.34 cm  LV Volumes (MOD) LV vol d, MOD A2C: 121.0 ml LV vol d, MOD A4C: 149.0 ml LV vol s, MOD A2C: 66.9 ml LV vol s, MOD A4C: 52.0 ml LV SV MOD A2C:     54.1 ml LV SV MOD A4C:     149.0 ml LV SV MOD BP:      82.1 ml RIGHT VENTRICLE            IVC RV Basal diam:  4.73 cm    IVC diam: 2.80 cm RV Mid diam:    3.70 cm RV S prime:     7.72 cm/s TAPSE (M-mode): 1.7 cm LEFT ATRIUM              Index        RIGHT ATRIUM           Index LA Vol (A2C):   113.0 ml 49.58 ml/m  RA Area:     31.30 cm LA Vol (A4C):   59.1 ml  25.93 ml/m  RA Volume:   127.00 ml 55.72 ml/m LA Biplane Vol: 88.0 ml  38.61 ml/m  AORTIC VALVE                     PULMONIC VALVE AV Area (Vmax):    1.78 cm      PV Vmax:        1.14 m/s AV Area (Vmean):   1.58 cm      PV Vmean:       74.100 cm/s AV Area (VTI):     1.75 cm      PV VTI:         0.218 m AV Vmax:           180.00 cm/s   PV Peak grad:   5.2 mmHg AV Vmean:          123.000 cm/s  PV Mean grad:   3.0 mmHg AV VTI:            0.392 m       RVOT Peak grad: 1 mmHg AV Peak Grad:      13.0 mmHg AV Mean Grad:      7.0 mmHg LVOT Vmax:         73.90 cm/s LVOT Vmean:        44.900 cm/s LVOT VTI:          0.158 m LVOT/AV VTI ratio: 0.40  AORTA Ao Root diam: 4.25 cm MITRAL VALVE MV Area (PHT): 1.55 cm     SHUNTS MV Area VTI:   1.36 cm     Systemic VTI:  0.16 m MV Peak grad:  12.7 mmHg    Systemic Diam: 2.35 cm MV Mean grad:  7.0 mmHg     Pulmonic VTI:  0.131  m MV Vmax:       1.78 m/s MV Vmean:      122.0  cm/s MV Decel Time: 489 msec MV E velocity: 167.00 cm/s Evalene Lunger MD Electronically signed by Evalene Lunger MD Signature Date/Time: 10/06/2023/12:38:16 PM    Final    DG Chest 2 View Result Date: 10/05/2023 CLINICAL DATA:  hypoxia EXAM: CHEST - 2 VIEW COMPARISON:  Chest x-ray 11/28/2020 FINDINGS: Patient is rotated. The heart and mediastinal contours are within normal limits. Mitral annular replacement. Right base patchy airspace opacity. No pulmonary edema. Trace right pleural effusion. No pneumothorax. No acute osseous abnormality.  Sternotomy wires are intact. IMPRESSION: Right base patchy airspace opacity with trace right pleural effusion. Electronically Signed   By: Morgane  Naveau M.D.   On: 10/05/2023 18:04    Microbiology: Results for orders placed or performed during the hospital encounter of 11/28/20  Culture, blood (routine x 2)     Status: Abnormal   Collection Time: 11/28/20  1:24 PM   Specimen: BLOOD  Result Value Ref Range Status   Specimen Description   Final    BLOOD RIGHT ANTECUBITAL Performed at [facility], [address], [place], [place] [address]    Special Requests   Final    BOTTLES DRAWN AEROBIC AND ANAEROBIC Blood Culture adequate volume Performed at [facility], [address] [place], [place] [address]    Culture  Setup Time   Final    Organism ID to follow GRAM POSITIVE COCCI ANAEROBIC BOTTLE ONLY CRITICAL RESULT CALLED TO, READ BACK BY AND VERIFIED WITH: ANGELA ROBBINS @ 1446 ON 11/29/2020.[email or web address] Performed at [facility], [address] [place], [place] [address]    Culture (A)  Final    STAPHYLOCOCCUS EPIDERMIDIS THE SIGNIFICANCE OF ISOLATING THIS ORGANISM FROM A SINGLE SET OF BLOOD CULTURES WHEN MULTIPLE SETS ARE DRAWN IS UNCERTAIN. PLEASE NOTIFY THE MICROBIOLOGY DEPARTMENT WITHIN ONE WEEK IF SPECIATION AND SENSITIVITIES ARE REQUIRED. Performed at [facility], [address] [place], [place] [address]     Report Status 12/01/2020 FINAL  Final  Blood Culture ID Panel (Reflexed)     Status: Abnormal   Collection Time: 11/28/20  1:24 PM  Result Value Ref Range Status   Enterococcus faecalis NOT DETECTED NOT DETECTED Final   Enterococcus Faecium NOT DETECTED NOT DETECTED Final   Listeria monocytogenes NOT DETECTED NOT DETECTED Final   Staphylococcus species DETECTED (A) NOT DETECTED Final    Comment: CRITICAL RESULT CALLED TO, READ BACK BY AND VERIFIED WITH: ANGELA ROBBINS @ 1446 ON 11/29/2020.[email or web address]    Staphylococcus aureus (BCID) NOT DETECTED NOT DETECTED Final   Staphylococcus epidermidis DETECTED (A) NOT DETECTED Final    Comment: CRITICAL RESULT CALLED TO, READ BACK BY AND VERIFIED WITH: ANGELA ROBBINS @ 1446 ON 11/29/2020.[email or web address]    Staphylococcus lugdunensis NOT DETECTED NOT DETECTED Final   Streptococcus species NOT DETECTED NOT DETECTED Final   Streptococcus agalactiae NOT DETECTED NOT DETECTED Final   Streptococcus pneumoniae NOT DETECTED NOT DETECTED Final   Streptococcus pyogenes NOT DETECTED NOT DETECTED Final   A.calcoaceticus-baumannii NOT DETECTED NOT DETECTED Final   Bacteroides fragilis NOT DETECTED NOT DETECTED Final   Enterobacterales NOT DETECTED NOT DETECTED Final   Enterobacter cloacae complex NOT DETECTED NOT DETECTED Final   Escherichia coli NOT DETECTED NOT DETECTED Final   Klebsiella aerogenes NOT DETECTED NOT DETECTED Final   Klebsiella oxytoca NOT DETECTED NOT DETECTED Final   Klebsiella pneumoniae NOT DETECTED NOT DETECTED Final  Proteus species NOT DETECTED NOT DETECTED Final   Salmonella species NOT DETECTED NOT DETECTED Final   Serratia marcescens NOT DETECTED NOT DETECTED Final   Haemophilus influenzae NOT DETECTED NOT DETECTED Final   Neisseria meningitidis NOT DETECTED NOT DETECTED Final   Pseudomonas aeruginosa NOT DETECTED NOT DETECTED Final   Stenotrophomonas maltophilia NOT DETECTED NOT DETECTED Final   Candida albicans NOT DETECTED NOT DETECTED  Final   Candida auris NOT DETECTED NOT DETECTED Final   Candida glabrata NOT DETECTED NOT DETECTED Final   Candida krusei NOT DETECTED NOT DETECTED Final   Candida parapsilosis NOT DETECTED NOT DETECTED Final   Candida tropicalis NOT DETECTED NOT DETECTED Final   Cryptococcus neoformans/gattii NOT DETECTED NOT DETECTED Final   Methicillin resistance mecA/C NOT DETECTED NOT DETECTED Final    Comment: Performed at [facility], [address] [place], [place] [address]  Culture, blood (routine x 2)     Status: None   Collection Time: 11/28/20  1:25 PM   Specimen: BLOOD  Result Value Ref Range Status   Specimen Description BLOOD LEFT ANTECUBITAL  Final   Special Requests   Final    BOTTLES DRAWN AEROBIC AND ANAEROBIC Blood Culture adequate volume   Culture   Final    NO GROWTH 5 DAYS Performed at [facility], [address] [place], [place] [address]    Report Status 12/03/2020 FINAL  Final  Resp Panel by RT-PCR (Flu A&B, Covid) Nasopharyngeal Swab     Status: None   Collection Time: 11/28/20  1:25 PM   Specimen: Nasopharyngeal Swab; Nasopharyngeal(NP) swabs in vial transport medium  Result Value Ref Range Status   SARS Coronavirus 2 by RT PCR NEGATIVE NEGATIVE Final    Comment: (NOTE) SARS-CoV-2 target nucleic acids are NOT DETECTED.  The SARS-CoV-2 RNA is generally detectable in upper respiratory specimens during the acute phase of infection. The lowest concentration of SARS-CoV-2 viral copies this assay can detect is 138 copies/mL. A negative result does not preclude SARS-Cov-2 infection and should not be used as the sole basis for treatment or other patient management decisions. A negative result may occur with  improper specimen collection/handling, submission of specimen other than nasopharyngeal swab, presence of viral mutation(s) within the areas targeted by this assay, and inadequate number of viral copies(<138 copies/mL). A negative result must be  combined with clinical observations, patient history, and epidemiological information. The expected result is Negative.  Fact Sheet for Patients:  BloggerCourse.com  Fact Sheet for Healthcare Providers:  SeriousBroker.it  This test is no t yet approved or cleared by the [place]  FDA and  has been authorized for detection and/or diagnosis of SARS-CoV-2 by FDA under an Emergency Use Authorization (EUA). This EUA will remain  in effect (meaning this test can be used) for the duration of the COVID-19 declaration under Section 564(b)(1) of the Act, 21 U.S.C.section 360bbb-3(b)(1), unless the authorization is terminated  or revoked sooner.       Influenza A by PCR NEGATIVE NEGATIVE Final   Influenza B by PCR NEGATIVE NEGATIVE Final    Comment: (NOTE) The Xpert Xpress SARS-CoV-2/FLU/RSV plus assay is intended as an aid in the diagnosis of influenza from Nasopharyngeal swab specimens and should not be used as a sole basis for treatment. Nasal washings and aspirates are unacceptable for Xpert Xpress SARS-CoV-2/FLU/RSV testing.  Fact Sheet for Patients: BloggerCourse.com  Fact Sheet for Healthcare Providers: SeriousBroker.it  This test is not yet approved or cleared by the [place]  FDA  and has been authorized for detection and/or diagnosis of SARS-CoV-2 by FDA under an Emergency Use Authorization (EUA). This EUA will remain in effect (meaning this test can be used) for the duration of the COVID-19 declaration under Section 564(b)(1) of the Act, 21 U.S.C. section 360bbb-3(b)(1), unless the authorization is terminated or revoked.  Performed at [facility], [address] [place], [place] [address]     Labs: CBC: Recent Labs  Lab 10/05/23 1731  WBC 8.4  HGB 10.9*  HCT 35.4*  MCV 96.7  PLT 197   Basic Metabolic Panel: Recent Labs  Lab 10/05/23 1731  10/06/23 0332 10/07/23 0344  NA 140 139 140  K 3.6 3.2* 3.7  CL 103 101 103  CO2 24 25 28   GLUCOSE 138* 98 120*  BUN 12 10 18   CREATININE 0.93 0.87 1.34*  CALCIUM  9.1 8.1* 8.6*    Discharge time spent: greater than 30 minutes.  Signed: Leita Blanch, MD [facility] 10/07/2023

## 2023-10-07 NOTE — Evaluation (Signed)
 Occupational Therapy Evaluation Patient Details Name: Christian Clark MRN: [id number] DOB: November 27, 1945 Today's Date: 10/07/2023   History of Present Illness   Pt is a 78 y.o. male with medical history significant of history of alcohol abuse, history of substance abuse, atrial fibrillation, frequent falls, history of CVA, chronic systolic heart failure, iron  deficiency anemia who presented with significant lower extremity edema, shortness of breath with exertion, overall anasarca.  Admitted with acute exacerbation of CHF.     Clinical Impressions Pt was seen for OT evaluation this date. Prior to hospital admission, pt was living alone with HHAs 7 days per week from 9am-1pm, 4pm-8pm providing assistance with all IADLs and Min A with ADLs however pt reports that level of assistance would fluctuate based on his needs.  Pt also reports that HHA care to resume following discharge. Pt lives in a 1 level house, 3 STE without railing at the front door, however garage door (primary entrance) has 2 STE with rail. Pt is close to functional baseline however presents with deficits in activity tolerance with O2 sats monitored and found to fluctuate between 89-93% on room air, affecting safe and optimal ADL completion. Pt currently requires Min A for LB ADLs and CGA for functional transfers utilizing RW which is close of baseline.  Pt would benefit from skilled OT services to address noted impairments and functional limitations (see below for any additional details) in order to maximize safety and independence while minimizing future risk of falls, injury, and readmission. Do not anticipate the need for follow up OT services upon acute hospital DC.      If plan is discharge home, recommend the following:   A little help with walking and/or transfers;A little help with bathing/dressing/bathroom;Help with stairs or ramp for entrance     Functional Status Assessment   Patient has had a recent decline in  their functional status and demonstrates the ability to make significant improvements in function in a reasonable and predictable amount of time.     Equipment Recommendations         Recommendations for Other Services         Precautions/Restrictions   Precautions Precautions: Fall Recall of Precautions/Restrictions: Intact Restrictions Weight Bearing Restrictions Per Provider Order: No     Mobility Bed Mobility               General bed mobility comments: Seated in recliner chair at start/end of session    Transfers Overall transfer level: Needs assistance Equipment used: Rolling walker (2 wheels) Transfers: Sit to/from Stand, Bed to chair/wheelchair/BSC Sit to Stand: Contact guard assist     Step pivot transfers: Contact guard assist, From elevated surface            Balance Overall balance assessment: Needs assistance   Sitting balance-[name]: Normal       Standing balance-[name]: Fair                             ADL either performed or assessed with clinical judgement   ADL Overall ADL's : Needs assistance/impaired     Grooming: Set up;Sitting;Wash/dry face;Brushing hair               Lower Body Dressing: Minimal assistance;Sit to/from stand Lower Body Dressing Details (indicate cue type and reason): Unable to doff/don socks with out assistance Toilet Transfer: Contact guard assist;Rolling walker (2 wheels) Toilet Transfer Details (indicate cue type and reason): simulated Toileting-  Clothing Manipulation and Hygiene: Contact guard assist Toileting - Clothing Manipulation Details (indicate cue type and reason): simulated     Functional mobility during ADLs: Contact guard assist;Rolling walker (2 wheels)       Vision Patient Visual Report: No change from baseline       Perception         Praxis         Pertinent Vitals/Pain Pain Assessment Pain Assessment: No/denies pain     Extremity/Trunk  Assessment Upper Extremity Assessment Upper Extremity Assessment: Generalized weakness   Lower Extremity Assessment Lower Extremity Assessment: Defer to PT evaluation       Communication Communication Communication: No apparent difficulties   Cognition Arousal: Alert Behavior During Therapy: WFL for tasks assessed/performed Cognition: No apparent impairments                               Following commands: Intact       Cueing  General Comments   Cueing Techniques: Verbal cues      Exercises Other Exercises Other Exercises: Education on role of OT in acute   Shoulder Instructions      Home Living Family/patient expects to be discharged to:: Private residence Living Arrangements: Alone Available Help at Discharge: Personal care attendant Type of Home: House Home Access: Stairs to enter Secretary/administrator of Steps: 2 Entrance Stairs-Rails: Right Home Layout: One level     Bathroom Shower/Tub: Producer, television/film/video: Standard     Home Equipment: Agricultural consultant (2 wheels);BSC/3in1;Shower seat   Additional Comments: RW at basedline as of 3-4 months ago      Prior Functioning/Environment               Mobility Comments: uses RW for ambulation ADLs Comments: Pt reports HHA assists with all IADLs and provides Min A for LB ADLs and bathing tasks.    OT Problem List: Decreased strength;Decreased safety awareness   OT Treatment/Interventions: Self-care/ADL training;Therapeutic activities;Patient/family education;Balance training      OT Goals(Current goals can be found in the care plan section)   Acute Rehab OT Goals Patient Stated Goal: to return home today OT Goal Formulation: With patient Time For Goal Achievement: 10/21/23 Potential to Achieve Goals: Good ADL Goals Pt Will Perform Lower Body Dressing: with supervision;sit to/from stand Pt Will Transfer to Toilet: with modified independence;bedside commode;regular  height toilet   OT Frequency:  Min 1X/week    Co-evaluation              AM-PAC OT 6 Clicks Daily Activity     Outcome Measure Help from another person eating meals?: None Help from another person taking care of personal grooming?: None Help from another person toileting, which includes using toliet, bedpan, or urinal?: A Little Help from another person bathing (including washing, rinsing, drying)?: A Little Help from another person to put on and taking off regular upper body clothing?: None Help from another person to put on and taking off regular lower body clothing?: A Little 6 Click Score: 21   End of Session Equipment Utilized During Treatment: Gait belt;Rolling walker (2 wheels) Nurse Communication: Mobility status  Activity Tolerance: Patient tolerated treatment well Patient left: in chair;with call bell/phone within reach;with chair alarm set  OT Visit Diagnosis: Unsteadiness on feet (R26.81)                Time: 9190-9160 OT Time Calculation (min): 30 min Charges:  OT General Charges $OT Visit: 1 Visit OT Evaluation $OT Eval Low Complexity: 1 Low OT Treatments $Self Care/Home Management : 8-22 mins $Therapeutic Activity: 8-22 mins  Harlene Sharps OTR/L   Harlene LITTIE Sharps 10/07/2023, 11:22 AM

## 2023-10-07 NOTE — Progress Notes (Signed)
 Heart Failure Stewardship Pharmacy Note  PCP: Lenon Layman ORN, MD PCP-Cardiologist: None  HPI: Christian Clark is a 78 y.o. male with coronary artery disease, hypertension, chronic atrial fibrillation (On Eliquis ), hx CVA, OSA (not on CPAP), Hx PE, alcohol use, hx substance abuse, frequent falls who presented with lower extremity edema. On admission, BNP was 236.9, HS-troponin was 29. Chest x-ray noted right base patchy airspace opacity with trace right pleural effusion.   Pertinent cardiac history: TTE 01/2018 with LVEF 45%. CVA 05/2021. TTE 05/2021 with LVEF 50-55%, moderate LVH. TTE this admission showed LVEF of 50-55%, mild LVH, abnormal GLS.   Pertinent Lab Values: Creatinine, Ser  Date Value Ref Range Status  10/07/2023 1.34 (H) 0.61 - 1.24 mg/dL Final   BUN  Date Value Ref Range Status  10/07/2023 18 8 - 23 mg/dL Final   Potassium  Date Value Ref Range Status  10/07/2023 3.7 3.5 - 5.1 mmol/L Final   Sodium  Date Value Ref Range Status  10/07/2023 140 135 - 145 mmol/L Final   B Natriuretic Peptide  Date Value Ref Range Status  10/05/2023 236.9 (H) 0.0 - 100.0 pg/mL Final    Comment:    Performed at [facility], [address], [place], [place] [address]   Magnesium   Date Value Ref Range Status  09/05/2021 1.6 (L) 1.7 - 2.4 mg/dL Final    Comment:    Performed at [facility], [address] [place], [place] [address]   Hgb A1c MFr Bld  Date Value Ref Range Status  09/06/2021 5.8 (H) 4.8 - 5.6 % Final    Comment:    (NOTE) Pre diabetes:          5.7%-6.4%  Diabetes:              >6.4%  Glycemic control for   <7.0% adults with diabetes    TSH  Date Value Ref Range Status  11/28/2020 1.095 0.350 - 4.500 uIU/mL Final    Comment:    Performed by a 3rd Generation assay with a functional sensitivity of <=0.01 uIU/mL. Performed at [facility], [address] [place], [place] [address]     Vital Signs:  Temp:  [97.9  F (36.6 C)-98.5 F (36.9 C)] 98.5 F (36.9 C) (09/18 0426) Pulse Rate:  [27-73] 61 (09/18 0426) Cardiac Rhythm: Atrial fibrillation (09/17 1905) Resp:  [17-27] 18 (09/18 0426) BP: (95-130)/(52-74) 130/74 (09/18 0426) SpO2:  [89 %-95 %] 93 % (09/18 0426) Weight:  [93.4 kg (205 lb 14.6 oz)] 93.4 kg (205 lb 14.6 oz) (09/18 0500)  Intake/Output Summary (Last 24 hours) at 10/07/2023 0805 Last data filed at 10/07/2023 0100 Gross per 24 hour  Intake --  Output 3450 ml  Net -3450 ml    Current Heart Failure Medications:  Loop diuretic: furosemide  20 mg PO daily Beta-Blocker: carvedilol  25 mg BID ACEI/ARB/ARNI: none MRA: none SGLT2i: Farxiga  10 mg daily Other: none  Prior to admission Heart Failure Medications:  Loop diuretic: none Beta-Blocker: none ACEI/ARB/ARNI: none MRA: none SGLT2i: none Other: amlodipine 5 mg daily  Assessment: 1. Acute on chronic diastolic heart failure (LVEF 50-55%)  , due to NICM. NYHA class II symptoms.  -Symptoms: Reports feeling back to baseline. LEE is significantly reduced. No other complaints today. -Volume: Appears euvolemic. Legs are pruning. Excellent urine output yesterday. Transitioned to oral diuretics today. -Hemodynamics: BP mildly elevated. HR 60s -SGLT2i: Started on Farxiga  10 mg daily -MRA: Can consider adding spironolactone 12.5 mg daily  outpatient. -ARNI: Nota  candidate at this time.   Plan: 1) Medication changes recommended at this time: -None  2) Patient assistance: -Pending  3) Education: - Patient has been educated on current HF medications and potential additions to HF medication regimen - Patient verbalizes understanding that over the next few months, these medication doses may change and more medications may be added to optimize HF regimen - Patient has been educated on basic disease state pathophysiology and goals of therapy  Medication Assistance / Insurance Benefits Check: Does the patient have prescription  insurance?    Type of insurance plan:  Does the patient qualify for medication assistance through manufacturers or grants? Pending   Please do not hesitate to reach out with questions or concerns,  Jaun Bash, PharmD, CPP, BCPS, [facility] Heart Failure Pharmacist  Phone - [phone] 10/07/2023 12:35 PM

## 2023-10-07 NOTE — Progress Notes (Signed)
 Heart Failure Navigator Progress Note  Assessed for Heart & Vascular TOC clinic readiness.  Patient does not meet criteria due to current [facility] Cardiology patient.   Navigator will sign off at this time.  Roxy Horseman, RN, BSN [facility] Heart Failure Navigator Secure Chat Only

## 2023-10-07 NOTE — Plan of Care (Signed)

## 2023-10-07 NOTE — Care Management Important Message (Signed)
 Important Message  Patient Details  Name: Christian Clark MRN: [id number] Date of Birth: Dec 06, 1945   Important Message Given:  Yes - Medicare IM     Christian Clark 10/07/2023, 11:45 AM

## 2023-10-07 NOTE — Progress Notes (Signed)
   10/07/23 0900  Mobility  Activity Ambulated with assistance;Stood at bedside;Respositioned in chair  Level of Assistance Standby assist, set-up cues, supervision of patient - no hands on  Assistive Device Front wheel walker  Distance Ambulated (ft) 210 ft  Range of Motion/Exercises Active Assistive  Activity Response Tolerated well  Mobility Referral Yes  Mobility visit 1 Mobility  Mobility Specialist Start Time (ACUTE ONLY) [id number]  Mobility Specialist Stop Time (ACUTE ONLY) [id number]  Mobility Specialist Time Calculation (min) (ACUTE ONLY) 21 min   Mobility Specialist - Progress Note Pt was in the chair upon entry. Pt agreed to mobility. Pt O2 level were 98%. Pt was able to STS independently. Pt ambulated well. Pt stated he felt good during ambulation. Checked O2 levels midway through to allow recovery which was not needed. O2 level stayed above 91%. After activity pt repositioned back in chair with needs in reach and chair alarm on.  Clem Rodes Mobility Specialist 10/07/23, 9:49 AM

## 2023-10-07 NOTE — TOC CM/SW Note (Signed)
 Transition of Care [facility]) - Inpatient Brief Assessment   Patient Details  Name: Gavin Day Agro MRN: [id number] Date of Birth: [birth year]  [facility] of [facility] CM/SW Contact:    Lauraine JAYSON Carpen, LCSW Phone Number: 10/07/2023, 9:24 AM   Clinical Narrative: CSW reviewed chart. SDOH flag for housing. Added resources to AVS. Per Patient Cort, patient was at [facility] 4/25-9/3 (131 days). CSW will continue to follow progress. Please place [facility] consult if any needs arise.  Transition of Care Asessment: Insurance and Status: Insurance coverage has been reviewed Patient has primary care physician: Yes Home environment has been reviewed: Single family home Prior level of function:: Not documented Prior/Current Home Services: No current home services Social Drivers of Health Review: SDOH reviewed interventions complete Readmission risk has been reviewed: Yes Transition of care needs: no transition of care needs at this time

## 2023-10-07 NOTE — Discharge Instructions (Addendum)
 Rent/Utility/Housing  Agency Name: [facility] Address: [address] [place], [place] [address] Phone: [phone] Email: [email or web address] Website: [email or web address] Service(s) Offered: Housing services, self-sufficiency, congregate meal program, weatherization program, Field seismologist program, emergency food assistance,  housing counseling, home ownership program, wheels -towork program.  Agency Name: [facility] Address: [address], [place], [place] [address] Phone: [phone] (8a-4p) [phone] (8p- 10p) Email: [email or web address] Website: [email or web address] Service(s) Offered: A program for homeless and/or needy men that includes one-on-one counseling, life skills training and job rehabilitation.  Agency Name: [facility] Address: [address] [place], [place] [address] Phone: [phone] Website: [email or web address] Service(s) Offered: Assistance to needy in emergency with utility bills, heating fuel, and prescriptions. Shelter for homeless 7pm-7am. May 14, 2016 15  Agency Name: [facility] [place] [facility] Dis[facility] Address: [address], [place], [place] [address] Phone: [phone] Contact Person: Lemond Cart Email: [email or web address] Website: [email or web address] Service(s) Offered: Helps individuals with developmental disabilities move from housing that is more restrictive to homes where they  can achieve greater independence and have more  opportunities.  Agency Name: [facility] Address: [address], [place], [place] [address] Phone: [phone] Email: [email or web address] Website: [email or web address] Service(s) Offered: Provides affordable housing for low-income families, elderly, and disabled individuals. Offer a wide range of  programs and services, from financial planning to  afterschool and summer programs.  Agency Name: [facility] Address: [address] [place], [place] [address] Phone: [phone] Service(s) Offered: Child support services; child welfare services; food stamps; Medicaid; work first family assistance; and aid with fuel,  rent, food and medicine.  Agency Name: [facility] [employer] Address: [facility] [address], [place], [place]  [address] Phone: [phone] Website: [email or web address] Service(s) Offered: 24 hour Crisis Line: [phone]; 24 hour Emergency Shelter; Transitional Housing; Support Groups; Scientist, physiological; [employer]; Hispanic Outreach: [phone];  Visitation Center: [phone].  Agency Name: [facility] [place]. Address: [address] [place], [place] [address] Phone: [phone] Service(s) Offered: CAP Services; Home and [employer]; Individual or Group Supports; Respite Care Non-Institutional Nursing;  Residential Supports; Respite Care and Personal Care Services; Transportation; Family and Friends Night; Recreational Activities; Three Nutritious Meals/Snacks; Consultation with Registered Dietician; Twenty-four hour Registered Nurse Access; Daily and [employer]; [employer]; [place] for the [employer] (During Summer Months) Bingo Night (Every  Wednesday Night); Special Populations Dance Night  (Every Tuesday Night); Professional Hair Care Services.  Agency Name: [facility] Address: [address], [place], [place] [address] Phone: [phone] Contact Person: Meade High Website: [email or web address].Physicist, medical) Offered: Residential treatment facility for women; food and  clothing, educational & employment development and  transportation to work; Counsellor of financial skills;  parenting and family reunification; emotional and spiritual  support;  transitional housing for program graduates.  Agency Name: [facility] Address: [address] [place], [place] [address] Phone: [phone] Email: [email or web address] Website: [email or web address] Service(s) Offered: Public housing units for elderly, disabled, and low income people; housing choice vouchers for income eligible  applicants; shelter plus care vouchers; and Psychologist, clinical.  Agency Name: [facility] [employer] Address: [address] [place], [place] [address] Phone: [phone] Email: [email or web address] Website: [email or web address].habitatalamance.org Service(s) Offered: Build houses for families in need of decent housing. Each adult in the family must invest 200 hours of labor on  someone else's house, work with volunteers to build their own house, attend classes  on budgeting, home maintenance, yard care, and attend homeowner association meetings.  Agency Name: [facility] Address: [address], [place], [place] [address] Phone: [phone] Website: [email or web address] Service(s) Offered: Intermediate care facilities for intellectually delayed, Supervised Living in group homes for adults with developmental disabilities, Supervised Living for people who have dual diagnoses (MRMI), Independent Living, Supported Living, respite and a variety of CAP services, pre-vocational services, day supports, and [employer].  Agency Name: [place] [employer] [facility] Phone: [phone] Website: www.NCForeclosurePrevention.gov Service(s) Offered: Zero-interest, deferred loans to homeowners struggling to pay their mortgage. Call for more information.  Low Sodium Nutrition Therapy  Eating less sodium can help you if you have high blood pressure, heart failure, or kidney or liver disease.   Your body needs a little sodium, but too much sodium can cause your body to hold onto extra water. This extra water will raise your blood  pressure and can cause damage to your heart, kidneys, or liver as they are forced to work harder.   Sometimes you can see how the extra fluid affects you because your hands, legs, or belly swell. You may also hold water around your heart and lungs, which makes it hard to breathe.   Even if you take medication for blood pressure or a water pill (diuretic) to remove fluid, it is still important to have less salt in your diet.   Check with your primary care provider before drinking alcohol since it may affect the amount of fluid in your body and how your heart, kidneys, or liver work. Sodium in Food A low-sodium meal plan limits the sodium that you get from food and beverages to 1,500-2,000 milligrams (mg) per day. Salt is the main source of sodium. Read the nutrition label on the package to find out how much sodium is in one serving of a food.  Select foods with 140 milligrams (mg) of sodium or less per serving.  You may be able to eat one or two servings of foods with a little more than 140 milligrams (mg) of sodium if you are closely watching how much sodium you eat in a day.  Check the serving size on the label. The amount of sodium listed on the label shows the amount in one serving of the food. So, if you eat more than one serving, you will get more sodium than the amount listed.  Tips Cutting Back on Sodium Eat more fresh foods.  Fresh fruits and vegetables are low in sodium, as well as frozen vegetables and fruits that have no added juices or sauces.  Fresh meats are lower in sodium than processed meats, such as bacon, sausage, and hotdogs.  Not all processed foods are unhealthy, but some processed foods may have too much sodium.  Eat less salt at the table and when cooking. One of the ingredients in salt is sodium.  One teaspoon of table salt has 2,300 milligrams of sodium.  Leave the salt out of recipes for pasta, casseroles, and soups. Be a Engineer, building services.  Food packages that say  "Salt-free", sodium-free", "very low sodium," and "low sodium" have less than 140 milligrams of sodium per serving.  Beware of products identified as "Unsalted," "No Salt Added," "Reduced Sodium," or "Lower Sodium." These items may still be high in sodium. You should always check the nutrition label. Add flavors to your food without adding sodium.  Try lemon juice, lime juice, or vinegar.  Dry or fresh herbs add flavor.  Buy a sodium-free  seasoning blend or make your own at home. You can purchase salt-free or sodium-free condiments like barbeque sauce in stores and online. Ask your registered dietitian nutritionist for recommendations and where to find them.   Eating in Restaurants Choose foods carefully when you eat outside your home. Restaurant foods can be very high in sodium. Many restaurants provide nutrition facts on their menus or their websites. If you cannot find that information, ask your server. Let your server know that you want your food to be cooked without salt and that you would like your salad dressing and sauces to be served on the side.    Foods Recommended Food Group Foods Recommended  Grains Bread, bagels, rolls without salted tops Homemade bread made with reduced-sodium baking powder Cold cereals, especially shredded wheat and puffed rice Oats, grits, or cream of wheat Pastas, quinoa, and rice Popcorn, pretzels or crackers without salt Corn tortillas  Protein Foods Fresh meats and fish; mal[place] bacon (check the nutrition labels - make sure they are not packaged in a sodium solution) Canned or packed tuna (no more than 4 ounces at 1 serving) Beans and peas Soybeans) and tofu Eggs Nuts or nut butters without salt  Dairy Milk or milk powder Plant milks, such as rice and soy Yogurt, including Greek yogurt Small amounts of natural cheese (blocks of cheese) or reduced-sodium cheese can be used in moderation. (Swiss, ricotta, and fresh mozzarella cheese are lower in sodium  than the others) Cream Cheese Low sodium cottage cheese  Vegetables Fresh and frozen vegetables without added sauces or salt Homemade soups (without salt) Low-sodium, salt-free or sodium-free canned vegetables and soups  Fruit Fresh and canned fruits Dried fruits, such as raisins, cranberries, and prunes  Oils Tub or liquid margarine, regular or without salt Canola, corn, peanut, olive, safflower, or sunflower oils  Condiments Fresh or dried herbs such as basil, bay leaf, dill, mustard (dry), nutmeg, paprika, parsley, rosemary, sage, or thyme.  Low sodium ketchup Vinegar  Lemon or lime juice Pepper, red pepper flakes, and cayenne. Hot sauce contains sodium, but if you use just a drop or two, it will not add up to much.  Salt-free or sodium-free seasoning mixes and marinades Simple salad dressings: vinegar and oil   Foods Not Recommended Food Group Foods Not Recommended  Grains Breads or crackers topped with salt Cereals (hot/cold) with more than 300 mg sodium per serving Biscuits, cornbread, and other "quick" breads prepared with baking soda Pre-packaged bread crumbs Seasoned and packaged rice and pasta mixes Self-rising flours  Protein Foods Cured meats: Bacon, ham, sausage, pepperoni and hot dogs Canned meats (chili, vienna sausage, or sardines) Smoked fish and meats Frozen meals that have more than 600 mg of sodium per serving Egg substitute (with added sodium)  Dairy Buttermilk Processed cheese spreads Cottage cheese (1 cup may have over 500 mg of sodium; look for low-sodium.) American or feta cheese Shredded Cheese has more sodium than blocks of cheese String cheese  Vegetables Canned vegetables (unless they are salt-free, sodium-free or low sodium) Frozen vegetables with seasoning and sauces Sauerkraut and pickled vegetables Canned or dried soups (unless they are salt-free, sodium-free, or low sodium) [place] fries and onion rings  Fruit Dried fruits preserved with  additives that have sodium  Oils Salted butter or margarine, all types of olives  Condiments Salt, sea salt, kosher salt, onion salt, and garlic salt Seasoning mixes with salt Bouillon cubes Ketchup Barbeque sauce and Worcestershire sauce unless low sodium Soy sauce Salsa, pickles,  olives, relish Salad dressings: ranch, blue cheese, [place], and [place].   Low Sodium Sample 1-Day Menu  Breakfast 1 cup cooked oatmeal  1 slice whole wheat bread toast  1 tablespoon peanut butter without salt  1 banana  1 cup 1% milk  Lunch Tacos made with: 2 corn tortillas   cup black beans, low sodium   cup roasted or grilled chicken (without skin)   avocado  Squeeze of lime juice  1 cup salad greens  1 tablespoon low-sodium salad dressing   cup strawberries  1 orange  Afternoon Snack 1/3 cup grapes  6 ounces yogurt  Evening Meal 3 ounces herb-baked fish  1 baked potato  2 teaspoons olive oil   cup cooked carrots  2 thick slices tomatoes on:  2 lettuce leaves  1 teaspoon olive oil  1 teaspoon balsamic vinegar  1 cup 1% milk  Evening Snack 1 apple   cup almonds without salt   Low-Sodium Vegetarian (Lacto-Ovo) Sample 1-Day Menu  Breakfast 1 cup cooked oatmeal  1 slice whole wheat toast  1 tablespoon peanut butter without salt  1 banana  1 cup 1% milk  Lunch Tacos made with: 2 corn tortillas   cup black beans, low sodium   cup roasted or grilled chicken (without skin)   avocado  Squeeze of lime juice  1 cup salad greens  1 tablespoon low-sodium salad dressing   cup strawberries  1 orange  Evening Meal Stir fry made with:  cup tofu  1 cup brown rice   cup broccoli   cup green beans   cup peppers   tablespoon peanut oil  1 orange  1 cup 1% milk  Evening Snack 4 strips celery  2 tablespoons hummus  1 hard-boiled egg   Low-Sodium Vegan Sample 1-Day Menu  Breakfast 1 cup cooked oatmeal  1 tablespoon peanut butter without salt  1 cup blueberries  1 cup soymilk  fortified with calcium , vitamin B12, and vitamin D  Lunch 1 small whole wheat pita   cup cooked lentils  2 tablespoons hummus  4 carrot sticks  1 medium apple  1 cup soymilk fortified with calcium , vitamin B12, and vitamin D  Evening Meal Stir fry made with:  cup tofu  1 cup brown rice   cup broccoli   cup green beans   cup peppers   tablespoon peanut oil  1 cup cantaloupe  Evening Snack 1 cup soy yogurt   cup mixed nuts  Copyright 2020  Academy of Nutrition and Dietetics. All rights reserved  Sodium Free Flavoring Tips  When cooking, the following items may be used for flavoring instead of salt or seasonings that contain sodium. Remember: A little bit of spice goes a long way! Be careful not to overseason. Spice Blend Recipe (makes about ? cup) 5 teaspoons onion powder  2 teaspoons garlic powder  2 teaspoons paprika  2 teaspoon dry mustard  1 teaspoon crushed thyme leaves   teaspoon white pepper   teaspoon celery seed Food Item Flavorings  Beef Basil, bay leaf, caraway, curry, dill, dry mustard, garlic, grape jelly, green pepper, mace, marjoram, mushrooms (fresh), nutmeg, onion or onion powder, parsley, pepper, rosemary, sage  Chicken Basil, cloves, cranberries, mace, mushrooms (fresh), nutmeg, oregano, paprika, parsley, pineapple, saffron, sage, savory, tarragon, thyme, tomato, turmeric  Egg Chervil, curry, dill, dry mustard, garlic or garlic powder, green pepper, jelly, mushrooms (fresh), nutmeg, onion powder, paprika, parsley, rosemary, tarragon, tomato  Fish Basil, bay leaf, chervil, curry, dill, dry  mustard, green pepper, lemon juice, marjoram, mushrooms (fresh), paprika, pepper, tarragon, tomato, turmeric  Lamb Cloves, curry, dill, garlic or garlic powder, mace, mint, mint jelly, onion, oregano, parsley, pineapple, rosemary, tarragon, thyme  Pork Applesauce, basil, caraway, chives, cloves, garlic or garlic powder, onion or onion powder, rosemary, thyme  Veal  Apricots, basil, bay leaf, currant jelly, curry, ginger, marjoram, mushrooms (fresh), oregano, paprika  Vegetables Basil, dill, garlic or garlic powder, ginger, lemon juice, mace, marjoram, nutmeg, onion or onion powder, tarragon, tomato, sugar or sugar substitute, salt-free salad dressing, vinegar  Desserts Allspice, anise, cinnamon, cloves, ginger, mace, nutmeg, vanilla extract, other extracts   Copyright 2020  Academy of Nutrition and Dietetics. All rights reserved  Fluid Restricted Nutrition Therapy  You have been prescribed this diet because your condition affects how much fluid you can eat or drink. If your heart, liver, or kidneys aren't working properly, you may not be able to effectively eliminate fluids from the body and this may cause swelling (edema) in the legs, arms, and/or stomach. Drink no more than _________ liters or ________ ounces or ________cups of fluid per day.  You don't need to stop eating or drinking the same fluids you normally would, but you may need to eat or drink less than usual.  Your registered dietitian nutritionist will help you determine the correct amount of fluid to consume during the day Breakfast Include fluids taken with medications  Lunch Include fluids taken with medications  Dinner Include fluids taken with medications  Bedtime Snack Include fluids taken with medications     Tips What Are Fluids?  A fluid is anything that is liquid or anything that would melt if left at room temperature. You will need to count these foods and liquids--including any liquid used to take medication--as part of your daily fluid intake. Some examples are: Alcohol (drink only with your doctor's permission)  Coffee, tea, and other hot beverages  Gelatin (Jell-O)  Gravy  Ice cream, sherbet, sorbet  Ice cubes, ice chips  Milk, liquid creamer  Nutritional supplements  Popsicles  Vegetable and fruit juices; fluid in canned fruit  Watermelon  Yogurt  Soft drinks,  lemonade, limeade  Soups  Syrup How Do I Measure My Fluid Intake? Record your fluid intake daily.  Tip: Every day, each time you eat or drink fluids, pour water in the same amount into an empty container that can hold the same amount of fluids you are allowed daily. This may help you keep track of how much fluid you are taking in throughout the day.  To accurately keep track of how much liquid you take in, measure the size of the cups, glasses, and bowls you use. If you eat soup, measure how much of it is liquid and how much is solid (such as noodles, vegetables, meat). Conversions for Measuring Fluid Intake  Milliliters (mL) Liters (L) Ounces (oz) Cups (c)  1000 1 32 4  1200 1.2 40 5  1500 1.5 50 6 1/4  1800 1.8 60 7 1/2  2000 2 67 8 1/3  Tips to Reduce Your Thirst Chew gum or suck on hard candy.  Rinse or gargle with mouthwash. Do not swallow.  Ice chips or popsicles my help quench thirst, but this too needs to be calculated into the total restriction. Melt ice chips or cubes first to figure out how much fluid they produce (for example, experiment with melting  cup ice chips or 2 ice cubes).  Add a lemon wedge to your  water.  Limit how much salt you take in. A high salt intake might make you thirstier.  Don't eat or drink all your allowed liquids at once. Space your liquids out through the day.  Use small glasses and cups and sip slowly. If allowed, take your medications with fluids you eat or drink during a meal.   Fluid-Restricted Nutrition Therapy Sample 1-Day Menu  Breakfast 1 slice wheat toast  1 tablespoon peanut butter  1/2 cup yogurt (120 milliliters)  1/2 cup blueberries  1 cup milk (240 milliliters)   Lunch 3 ounces sliced [place]  2 slices whole wheat bread  1/2 cup lettuce for sandwich  2 slices tomato for sandwich  1 ounce reduced-fat, reduced-sodium cheese  1/2 cup fresh carrot sticks  1 banana  1 cup unsweetened tea (240 milliliters)   Evening Meal 8 ounces soup  (240 milliliters)  3 ounces salmon  1/2 cup quinoa  1 cup green beans  1 cup mixed greens salad  1 tablespoon olive oil  1 cup coffee (240 milliliters)  Evening Snack 1/2 cup sliced peaches  1/2 cup frozen yogurt (120 milliliters)  1 cup water (240 milliliters)  Copyright 2020  Academy of Nutrition and Dietetics. All rights reserved   Abstain from drinking Alcohol

## 2023-10-07 NOTE — Progress Notes (Signed)
 [facility] CLINIC CARDIOLOGY PROGRESS NOTE       Patient ID: Christian Clark MRN: [id number] DOB/AGE: March 18, 1945 78 y.o.  Admit date: 10/05/2023 Referring Physician Dr. Tobie Primary Physician Lenon Layman ORN, MD Primary Cardiologist Dr. Florencio (2023) Reason for Consultation Acute HF exacerbation  HPI: Christian Clark is a 78 y.o. male  with a past medical history of coronary artery disease, hypertension, chronic atrial fibrillation (On Eliquis ), hx CVA, OSA (not on CPAP), Hx PE, alcohol use, hx substance abuse, frequent falls who presented to the ED on 10/05/2023 for significant lower exetrmity edema for past 2 weeks. Denies SOB, orthopnea or CP. Endorses decreased appetite and mild abdominal bloating. Patient has hx noncompliant with treatment  Patient denies hx of HF. Cardiology was consulted for further evaluation.   Interval History: -Patient seen and examined this AM and laying comfortably in hospital bed, laying completely flat. Patient states he feels great and eager to go home and denies CP, SOB. No LEE. CTAB. Appears near euvolemia. -Patients BP and HR stable this AM. Overnight Tele showed no significant events.  -Yesterday UOP 3.9L, with worsening renal function. -Patient remains on room air with stable SpO2.   Review of systems complete and found to be negative unless listed above    Past Medical History:  Diagnosis Date   GERD (gastroesophageal reflux disease)    Substance abuse (HCC)     Past Surgical History:  Procedure Laterality Date   JOINT REPLACEMENT      Medications Prior to Admission  Medication Sig Dispense Refill Last Dose/Taking   acetaminophen  (TYLENOL ) 500 MG tablet Take 1 tablet (500 mg total) by mouth every 6 (six) hours as needed for moderate pain or mild pain. 30 tablet 0 Unknown   amLODipine (NORVASC) 5 MG tablet Take 5 mg by mouth daily.   10/05/2023 Morning   atorvastatin  (LIPITOR ) 80 MG tablet Take 80 mg by mouth daily.   Unknown    ELIQUIS  5 MG TABS tablet Take 1 tablet (5 mg total) by mouth 2 (two) times daily. 60 tablet 2 10/05/2023 Morning   carvedilol  (COREG ) 3.125 MG tablet Take 1 tablet (3.125 mg total) by mouth 2 (two) times daily. 60 tablet 2    cetirizine  (ZYRTEC ) 10 MG tablet Take 1 tablet (10 mg total) by mouth daily. 30 tablet 2    esomeprazole  (NEXIUM ) 40 MG capsule Take 1 capsule (40 mg total) by mouth daily. 30 capsule 2    potassium chloride  (KLOR-CON  M) 10 MEQ tablet Take 1 tablet (10 mEq total) by mouth daily for 5 days. 5 tablet 0    traZODone  (DESYREL ) 50 MG tablet Take 1 tablet (50 mg total) by mouth at bedtime as needed for sleep. 30 tablet 2    Social History   Socioeconomic History   Marital status: Divorced    Spouse name: Not on file   Number of children: 2   Years of education: Not on file   Highest education level: Not on file  Occupational History   Not on file  Tobacco Use   Smoking status: Former    Current packs/day: 0.00    Average packs/day: 1 pack/day for 20.0 years (20.0 ttl pk-yrs)    Types: Cigarettes    Start date: 16    Quit date: 2010    Years since quitting: 15.7   Smokeless tobacco: Never  Vaping Use   Vaping status: Never Used  Substance and Sexual Activity   Alcohol use: Not Currently   Drug  use: Never   Sexual activity: Not on file  Other Topics Concern   Not on file  Social History Narrative   Not on file   Social Drivers of Health   Financial Resource Strain: Not on file  Food Insecurity: No Food Insecurity (10/06/2023)   Hunger Vital Sign    Worried About Running Out of Food in the Last Year: Never true    Ran Out of Food in the Last Year: Never true  Transportation Needs: No Transportation Needs (10/06/2023)   PRAPARE - Administrator, Civil Service (Medical): No    Lack of Transportation (Non-Medical): No  Physical Activity: Not on file  Stress: Not on file  Social Connections: Moderately Integrated (10/06/2023)   Social Connection  and Isolation Panel    Frequency of Communication with Friends and Family: More than three times a week    Frequency of Social Gatherings with Friends and Family: Patient unable to answer    Attends Religious Services: More than 4 times per year    Active Member of Clubs or Organizations: Yes    Attends Banker Meetings: Never    Marital Status: Never married  Intimate Partner Violence: Not At Risk (10/06/2023)   Humiliation, Afraid, Rape, and Kick questionnaire    Fear of Current or Ex-Partner: No    Emotionally Abused: No    Physically Abused: No    Sexually Abused: No    Family History  Problem Relation Age of Onset   Atrial fibrillation Sister      Vitals:   10/06/23 2339 10/07/23 0426 10/07/23 0500 10/07/23 0815  BP: 116/69 130/74  126/74  Pulse: (!) 46 61  63  Resp: 17 18    Temp: 98 F (36.7 C) 98.5 F (36.9 C)  98 F (36.7 C)  TempSrc:  Oral    SpO2: 92% 93%  94%  Weight:   93.4 kg   Height:        PHYSICAL EXAM General: Chronically ill appearing elderly male, well nourished, in no acute distress. HEENT: Normocephalic and atraumatic. Neck: No JVD.   Lungs: Normal respiratory effort on room air. CTAB Diminished breath sounds bilaterally.  Heart: Irregularly, irregular, controlled rate. Normal S1 and S2 without gallops or murmurs.  Abdomen: Non-distended appearing.  Msk: Normal strength and tone for age. Extremities: Warm and well perfused. No clubbing, cyanosis, no edema. Significant wrinkles  Neuro: Alert and oriented X 3. Psych: Answers questions appropriately.   Labs: Basic Metabolic Panel: Recent Labs    10/06/23 0332 10/07/23 0344  NA 139 140  K 3.2* 3.7  CL 101 103  CO2 25 28  GLUCOSE 98 120*  BUN 10 18  CREATININE 0.87 1.34*  CALCIUM  8.1* 8.6*   Liver Function Tests: No results for input(s): AST, ALT, ALKPHOS, BILITOT, PROT, ALBUMIN in the last 72 hours. No results for input(s): LIPASE, AMYLASE in the last 72  hours. CBC: Recent Labs    10/05/23 1731  WBC 8.4  HGB 10.9*  HCT 35.4*  MCV 96.7  PLT 197   Cardiac Enzymes: Recent Labs    10/05/23 1731 10/05/23 2120  TROPONINIHS 29* 29*   BNP: Recent Labs    10/05/23 1731  BNP 236.9*   D-Dimer: No results for input(s): DDIMER in the last 72 hours. Hemoglobin A1C: No results for input(s): HGBA1C in the last 72 hours. Fasting Lipid Panel: No results for input(s): CHOL, HDL, LDLCALC, TRIG, CHOLHDL, LDLDIRECT in the last 72 hours. Thyroid  Function Tests: No results for input(s): TSH, T4TOTAL, T3FREE, THYROIDAB in the last 72 hours.  Invalid input(s): FREET3 Anemia Panel: No results for input(s): VITAMINB12, FOLATE, FERRITIN, TIBC, IRON , RETICCTPCT in the last 72 hours.   Radiology: ECHOCARDIOGRAM COMPLETE Result Date: 10/06/2023    ECHOCARDIOGRAM REPORT   Patient Name:   HERMEN MARIO Wolfer Date of Exam: 10/06/2023 Medical Rec #:  [id number]            Height:       70.0 in Accession #:    [id number]           Weight:       246.0 lb Date of Birth:  1945/05/27            BSA:          2.279 m Patient Age:    77 years             BP:           128/66 mmHg Patient Gender: M                    HR:           55 bpm. Exam Location:  [facility] Procedure: 2D Echo, Cardiac Doppler and Color Doppler (Both Spectral and Color            Flow Doppler were utilized during procedure). Indications:     CHF-Acute Systolic I50.21  History:         Patient has prior history of Echocardiogram examinations, most                  recent 06/03/2021. Mitral valve replacement; Mitral Valve                  Disease.  Sonographer:     Ashley McNeely-Sloane Referring Phys:  [id number] EMERY LITTIE FUSS Diagnosing Phys: Timothy Gollan MD IMPRESSIONS  1. Left ventricular ejection fraction, by estimation, is 50 to 55%. The left ventricle has low normal function. The left ventricle has no regional wall motion abnormalities. There is mild left  ventricular hypertrophy. Left ventricular diastolic parameters are indeterminate. The global longitudinal strain is abnormal.  2. Right ventricular systolic function is normal. The right ventricular size is normal. Tricuspid regurgitation signal is inadequate for assessing PA pressure.  3. The mitral valve is normal in structure. No evidence of mitral valve regurgitation. Mild to moderate mitral stenosis. The mean mitral valve gradient is 7.0 mmHg. Moderate mitral annular calcification.  4. The aortic valve is normal in structure. Aortic valve regurgitation is not visualized. Aortic valve sclerosis is present, with no evidence of aortic valve stenosis. Aortic valve mean gradient measures 7.0 mmHg.  5. There is borderline dilatation of the aortic root, measuring 39 mm.  6. The inferior vena cava is normal in size with greater than 50% respiratory variability, suggesting right atrial pressure of 3 mmHg. FINDINGS  Left Ventricle: Left ventricular ejection fraction, by estimation, is 50 to 55%. The left ventricle has low normal function. The left ventricle has no regional wall motion abnormalities. Strain was performed and the global longitudinal strain is abnormal. The left ventricular internal cavity size was normal in size. There is mild left ventricular hypertrophy. Left ventricular diastolic parameters are indeterminate. Right Ventricle: The right ventricular size is normal. No increase in right ventricular wall thickness. Right ventricular systolic function is normal. Tricuspid regurgitation signal is inadequate for assessing PA pressure. Left Atrium: Left atrial size was normal  in size. Right Atrium: Right atrial size was normal in size. Pericardium: There is no evidence of pericardial effusion. Mitral Valve: The mitral valve is normal in structure. There is moderate calcification of the mitral valve leaflet(s). Moderate mitral annular calcification. No evidence of mitral valve regurgitation. Mild to moderate  mitral valve stenosis. MV peak gradient, 12.7 mmHg. The mean mitral valve gradient is 7.0 mmHg. Tricuspid Valve: The tricuspid valve is normal in structure. Tricuspid valve regurgitation is not demonstrated. No evidence of tricuspid stenosis. Aortic Valve: The aortic valve is normal in structure. Aortic valve regurgitation is not visualized. Aortic valve sclerosis is present, with no evidence of aortic valve stenosis. Aortic valve mean gradient measures 7.0 mmHg. Aortic valve peak gradient measures 13.0 mmHg. Aortic valve area, by VTI measures 1.75 cm. Pulmonic Valve: The pulmonic valve was normal in structure. Pulmonic valve regurgitation is not visualized. No evidence of pulmonic stenosis. Aorta: The aortic root is normal in size and structure. There is borderline dilatation of the aortic root, measuring 39 mm. Venous: The inferior vena cava is normal in size with greater than 50% respiratory variability, suggesting right atrial pressure of 3 mmHg. IAS/Shunts: No atrial level shunt detected by color flow Doppler. Additional Comments: 3D was performed not requiring image post processing on an independent workstation and was indeterminate.  LEFT VENTRICLE PLAX 2D LVIDd:         4.90 cm      Diastology LVIDs:         3.40 cm      LV e' medial:    6.24 cm/s LV PW:         1.30 cm      LV E/e' medial:  26.8 LV IVS:        1.30 cm      LV e' lateral:   6.08 cm/s LVOT diam:     2.35 cm      LV E/e' lateral: 27.5 LV SV:         69 LV SV Index:   30 LVOT Area:     4.34 cm  LV Volumes (MOD) LV vol d, MOD A2C: 121.0 ml LV vol d, MOD A4C: 149.0 ml LV vol s, MOD A2C: 66.9 ml LV vol s, MOD A4C: 52.0 ml LV SV MOD A2C:     54.1 ml LV SV MOD A4C:     149.0 ml LV SV MOD BP:      82.1 ml RIGHT VENTRICLE            IVC RV Basal diam:  4.73 cm    IVC diam: 2.80 cm RV Mid diam:    3.70 cm RV S prime:     7.72 cm/s TAPSE (M-mode): 1.7 cm LEFT ATRIUM              Index        RIGHT ATRIUM           Index LA Vol (A2C):   113.0 ml 49.58  ml/m  RA Area:     31.30 cm LA Vol (A4C):   59.1 ml  25.93 ml/m  RA Volume:   127.00 ml 55.72 ml/m LA Biplane Vol: 88.0 ml  38.61 ml/m  AORTIC VALVE                     PULMONIC VALVE AV Area (Vmax):    1.78 cm      PV Vmax:        1.14  m/s AV Area (Vmean):   1.58 cm      PV Vmean:       74.100 cm/s AV Area (VTI):     1.75 cm      PV VTI:         0.218 m AV Vmax:           180.00 cm/s   PV Peak grad:   5.2 mmHg AV Vmean:          123.000 cm/s  PV Mean grad:   3.0 mmHg AV VTI:            0.392 m       RVOT Peak grad: 1 mmHg AV Peak Grad:      13.0 mmHg AV Mean Grad:      7.0 mmHg LVOT Vmax:         73.90 cm/s LVOT Vmean:        44.900 cm/s LVOT VTI:          0.158 m LVOT/AV VTI ratio: 0.40  AORTA Ao Root diam: 4.25 cm MITRAL VALVE MV Area (PHT): 1.55 cm     SHUNTS MV Area VTI:   1.36 cm     Systemic VTI:  0.16 m MV Peak grad:  12.7 mmHg    Systemic Diam: 2.35 cm MV Mean grad:  7.0 mmHg     Pulmonic VTI:  0.131 m MV Vmax:       1.78 m/s MV Vmean:      122.0 cm/s MV Decel Time: 489 msec MV E velocity: 167.00 cm/s Evalene Lunger MD Electronically signed by Evalene Lunger MD Signature Date/Time: 10/06/2023/12:38:16 PM    Final    DG Chest 2 View Result Date: 10/05/2023 CLINICAL DATA:  hypoxia EXAM: CHEST - 2 VIEW COMPARISON:  Chest x-ray 11/28/2020 FINDINGS: Patient is rotated. The heart and mediastinal contours are within normal limits. Mitral annular replacement. Right base patchy airspace opacity. No pulmonary edema. Trace right pleural effusion. No pneumothorax. No acute osseous abnormality.  Sternotomy wires are intact. IMPRESSION: Right base patchy airspace opacity with trace right pleural effusion. Electronically Signed   By: Morgane  Naveau M.D.   On: 10/05/2023 18:04    ECHO as above.  TELEMETRY reviewed by me 10/07/2023: atrial fibrillation, rate 50-60s  EKG reviewed by me: atrial fibrillation, rate 65 bpm.  Data reviewed by me 10/07/2023: last 24h vitals tele labs imaging I/O ED provider  note, admission H&P.  Principal Problem:   Acute exacerbation of CHF (congestive heart failure) (HCC) Active Problems:   Atrial fibrillation, chronic (HCC)   Alcohol abuse   Iron  deficiency anemia due to chronic blood loss   GERD (gastroesophageal reflux disease)   Chronic systolic CHF (congestive heart failure) (HCC)    ASSESSMENT AND PLAN:  Hershall Day Hogg is a 78 y.o. male  with a past medical history of coronary artery disease, hypertension, chronic atrial fibrillation (On Eliquis ), hx CVA, OSA (not on CPAP), Hx PE, alcohol use, hx substance abuse, frequent falls who presented to the ED on 10/05/2023 for significant lower exetrmity edema for past 2 weeks. Denies SOB, orthopnea or CP. Endorses decreased appetite and mild abdominal bloating. Patient has hx noncompliant with treatment  Patient denies hx of HF. Cardiology was consulted for further evaluation.   # Acute heart failure, mild systolic dysfunction  Echo this admission with low normal EF (50-55%), BNP elevated at 230 in setting of obesity. CXR with trace right pleural effusion and pulmonary vascular congestion. Patient  appears near euvolemia.  -Monitor and replenish electrolytes for a goal K >4, Mag >2  -Discontinue IV lasix  40 mg BID. Ordered PO lasix  20 mg daily to start tomorrow.  -Continue dapagliflozin  10 mg daily. -Continue home Coreg  25 mg BID. -Hold off on starting spirolactone due to GFR <30. Consider at outpatient follow-up. -Consider ARB at outpatient follow-up if BP/renal function allows. -Will plan to optimize GDMT as BP and renal function allows.   # Coronary artery disease # Hypertension # Hyperlipidemia -Continue atorvastatin  80 mg daily.  -Continue Coreg  as stated above.  -Minimally elevated and flat in setting of acute HF is most consistent with demand/supply mismatch and not ACS. No further cardiac diagnostics at this time.  # Chronic Atrial Fibrillation # Hx CVA # OSA (refuses CPAP) -Continue  Eliquis  5 mg BID for stroke risk reduction. -Continue Coreg  as stated above.   Follow-up scheduled with Dr. Custovic on 09/25 at 10:30 AM. No further cardiac recommendations at this time. Cardiology will sign off. Please haiku with questions or re-engage if needed.   This patient's plan of care was discussed and created with Dr. Custovic and she is in agreement.  Signed: Dorene Comfort, PA-C  10/07/2023, 8:23 AM [facility] Cardiology

## 2023-10-07 NOTE — Plan of Care (Signed)
 Nutrition Education Note  RD consulted for nutrition education regarding CHF.  Case discussed with RN and MD; requesting education prior to discharge today.   Spoke with pt at bedside, who was sitting in his recliner chair at time of visit. He reports feeling better and eager to discharge home today. Pt reports reduced appetite PTA secondary to respiratory distress from volume overload, but has improved. Pt states he gained 15# PTA.   Pt lives along, but has good support from twin brother and son. He also had daily support from aides 8 hours per day. He reports they have good relationships, but I only trust 2 of them to cook for me. Meals include fast food, take out sandwiches, and eggs, and bacon.   Pt does not weigh himself, but has an electric scale at home. Pt amenable for daily weights and states he will coordinate this with his nurses. Discussed parameters to call due fluid overload.   RD discussed importance of accountability and speaking to his caregivers about his treatment plan so they are all on the same page. RD provided additional handouts to assist with home education for reference.   RD provided Low Sodium Nutrition Therapy handout from the [employer]. Reviewed patient's dietary recall. Provided examples on ways to decrease sodium intake in diet. Discouraged intake of processed foods and use of salt shaker. Encouraged fresh fruits and vegetables as well as whole grain sources of carbohydrates to maximize fiber intake.   RD discussed why it is important for patient to adhere to diet recommendations, and emphasized the role of fluids, foods to avoid, and importance of weighing self daily. Teach back method used.  Expect fair compliance.  Current diet order is Heart Healthy (liberalized to 2 gram sodium), patient is consuming approximately 100% of meals at this time. Labs and medications reviewed. No further nutrition interventions warranted at this time.  RD contact information provided. If additional nutrition issues arise, please re-consult RD.   Margery ORN, RD, LDN, CDCES Registered Dietitian III Certified Diabetes Care and Education Specialist If unable to reach this RD, please use RD Inpatient group chat on secure chat between hours of 8am-4 pm daily

## 2023-11-29 ENCOUNTER — Emergency Department

## 2023-11-29 ENCOUNTER — Other Ambulatory Visit: Payer: Self-pay

## 2023-11-29 ENCOUNTER — Inpatient Hospital Stay
Admission: EM | Admit: 2023-11-29 | Discharge: 2023-12-02 | DRG: 638 | Disposition: A | Discharge status: Home Health | Attending: Family Medicine | Admitting: Family Medicine

## 2023-11-29 ENCOUNTER — Inpatient Hospital Stay

## 2023-11-29 DIAGNOSIS — K219 Gastro-esophageal reflux disease without esophagitis: Secondary | ICD-10-CM | POA: Diagnosis present

## 2023-11-29 DIAGNOSIS — F10129 Alcohol abuse with intoxication, unspecified: Secondary | ICD-10-CM | POA: Diagnosis present

## 2023-11-29 DIAGNOSIS — Y905 Blood alcohol level of 100-119 mg/100 ml: Secondary | ICD-10-CM | POA: Diagnosis present

## 2023-11-29 DIAGNOSIS — E1122 Type 2 diabetes mellitus with diabetic chronic kidney disease: Secondary | ICD-10-CM | POA: Diagnosis present

## 2023-11-29 DIAGNOSIS — F1010 Alcohol abuse, uncomplicated: Secondary | ICD-10-CM | POA: Diagnosis present

## 2023-11-29 DIAGNOSIS — K7031 Alcoholic cirrhosis of liver with ascites: Secondary | ICD-10-CM | POA: Diagnosis present

## 2023-11-29 DIAGNOSIS — I130 Hypertensive heart and chronic kidney disease with heart failure and stage 1 through stage 4 chronic kidney disease, or unspecified chronic kidney disease: Secondary | ICD-10-CM | POA: Diagnosis present

## 2023-11-29 DIAGNOSIS — E876 Hypokalemia: Secondary | ICD-10-CM | POA: Diagnosis present

## 2023-11-29 DIAGNOSIS — Z7901 Long term (current) use of anticoagulants: Secondary | ICD-10-CM

## 2023-11-29 DIAGNOSIS — D631 Anemia in chronic kidney disease: Secondary | ICD-10-CM | POA: Diagnosis present

## 2023-11-29 DIAGNOSIS — Z6831 Body mass index (BMI) 31.0-31.9, adult: Secondary | ICD-10-CM | POA: Diagnosis not present

## 2023-11-29 DIAGNOSIS — E1110 Type 2 diabetes mellitus with ketoacidosis without coma: Principal | ICD-10-CM | POA: Diagnosis present

## 2023-11-29 DIAGNOSIS — D696 Thrombocytopenia, unspecified: Secondary | ICD-10-CM | POA: Diagnosis present

## 2023-11-29 DIAGNOSIS — R109 Unspecified abdominal pain: Secondary | ICD-10-CM

## 2023-11-29 DIAGNOSIS — E8729 Other acidosis: Principal | ICD-10-CM | POA: Diagnosis present

## 2023-11-29 DIAGNOSIS — I4891 Unspecified atrial fibrillation: Secondary | ICD-10-CM

## 2023-11-29 DIAGNOSIS — E1165 Type 2 diabetes mellitus with hyperglycemia: Secondary | ICD-10-CM | POA: Diagnosis present

## 2023-11-29 DIAGNOSIS — E872 Acidosis, unspecified: Secondary | ICD-10-CM | POA: Insufficient documentation

## 2023-11-29 DIAGNOSIS — Z880 Allergy status to penicillin: Secondary | ICD-10-CM

## 2023-11-29 DIAGNOSIS — I5032 Chronic diastolic (congestive) heart failure: Secondary | ICD-10-CM | POA: Diagnosis present

## 2023-11-29 DIAGNOSIS — R1084 Generalized abdominal pain: Secondary | ICD-10-CM | POA: Diagnosis not present

## 2023-11-29 DIAGNOSIS — E785 Hyperlipidemia, unspecified: Secondary | ICD-10-CM | POA: Diagnosis present

## 2023-11-29 DIAGNOSIS — R001 Bradycardia, unspecified: Secondary | ICD-10-CM | POA: Diagnosis not present

## 2023-11-29 DIAGNOSIS — N179 Acute kidney failure, unspecified: Secondary | ICD-10-CM | POA: Diagnosis present

## 2023-11-29 DIAGNOSIS — Z91048 Other nonmedicinal substance allergy status: Secondary | ICD-10-CM

## 2023-11-29 DIAGNOSIS — I480 Paroxysmal atrial fibrillation: Secondary | ICD-10-CM | POA: Diagnosis present

## 2023-11-29 DIAGNOSIS — Z7984 Long term (current) use of oral hypoglycemic drugs: Secondary | ICD-10-CM

## 2023-11-29 DIAGNOSIS — E66811 Obesity, class 1: Secondary | ICD-10-CM | POA: Diagnosis present

## 2023-11-29 DIAGNOSIS — I482 Chronic atrial fibrillation, unspecified: Secondary | ICD-10-CM | POA: Diagnosis present

## 2023-11-29 DIAGNOSIS — Z91013 Allergy to seafood: Secondary | ICD-10-CM

## 2023-11-29 DIAGNOSIS — R7989 Other specified abnormal findings of blood chemistry: Secondary | ICD-10-CM | POA: Diagnosis not present

## 2023-11-29 DIAGNOSIS — R9431 Abnormal electrocardiogram [ECG] [EKG]: Secondary | ICD-10-CM

## 2023-11-29 DIAGNOSIS — Z79899 Other long term (current) drug therapy: Secondary | ICD-10-CM | POA: Diagnosis not present

## 2023-11-29 DIAGNOSIS — Z9102 Food additives allergy status: Secondary | ICD-10-CM

## 2023-11-29 DIAGNOSIS — Z888 Allergy status to other drugs, medicaments and biological substances status: Secondary | ICD-10-CM

## 2023-11-29 DIAGNOSIS — N1831 Chronic kidney disease, stage 3a: Secondary | ICD-10-CM | POA: Diagnosis present

## 2023-11-29 DIAGNOSIS — K429 Umbilical hernia without obstruction or gangrene: Secondary | ICD-10-CM | POA: Diagnosis present

## 2023-11-29 DIAGNOSIS — E860 Dehydration: Secondary | ICD-10-CM | POA: Diagnosis present

## 2023-11-29 DIAGNOSIS — Z87891 Personal history of nicotine dependence: Secondary | ICD-10-CM | POA: Diagnosis not present

## 2023-11-29 DIAGNOSIS — R627 Adult failure to thrive: Secondary | ICD-10-CM | POA: Diagnosis present

## 2023-11-29 DIAGNOSIS — Z91018 Allergy to other foods: Secondary | ICD-10-CM

## 2023-11-29 DIAGNOSIS — Z8673 Personal history of transient ischemic attack (TIA), and cerebral infarction without residual deficits: Secondary | ICD-10-CM

## 2023-11-29 LAB — AMMONIA: Ammonia: <13 umol/L (ref 9–35)

## 2023-11-29 LAB — COMPREHENSIVE METABOLIC PANEL WITH GFR
ALT: 82 U/L — ABNORMAL HIGH (ref 0–44)
AST: 138 U/L — ABNORMAL HIGH (ref 15–41)
Albumin: 4.2 g/dL (ref 3.5–5.0)
Alkaline Phosphatase: 75 U/L (ref 38–126)
Anion gap: 21 — ABNORMAL HIGH (ref 5–15)
BUN: 28 mg/dL — ABNORMAL HIGH (ref 8–23)
CO2: 31 mmol/L (ref 22–32)
Calcium: 9.2 mg/dL (ref 8.9–10.3)
Chloride: 86 mmol/L — ABNORMAL LOW (ref 98–111)
Creatinine, Ser: 1.4 mg/dL — ABNORMAL HIGH (ref 0.61–1.24)
GFR, Estimated: 52 mL/min — ABNORMAL LOW (ref >60)
Glucose, Bld: 106 mg/dL — ABNORMAL HIGH (ref 70–99)
Potassium: 2.9 mmol/L — ABNORMAL LOW (ref 3.5–5.1)
Sodium: 138 mmol/L (ref 135–145)
Total Bilirubin: 2.4 mg/dL — ABNORMAL HIGH (ref 0.0–1.2)
Total Protein: 9.2 g/dL — ABNORMAL HIGH (ref 6.5–8.1)

## 2023-11-29 LAB — CBC
HCT: 42.8 % (ref 39.0–52.0)
Hemoglobin: 14.2 g/dL (ref 13.0–17.0)
MCH: 29.8 pg (ref 26.0–34.0)
MCHC: 33.2 g/dL (ref 30.0–36.0)
MCV: 89.7 fL (ref 80.0–100.0)
Platelets: 130 K/uL — ABNORMAL LOW (ref 150–400)
RBC: 4.77 MIL/uL (ref 4.22–5.81)
RDW: 14 % (ref 11.5–15.5)
WBC: 6.6 K/uL (ref 4.0–10.5)
nRBC: 0 % (ref 0.0–0.2)

## 2023-11-29 LAB — LIPASE, BLOOD: Lipase: 46 U/L (ref 11–51)

## 2023-11-29 LAB — LACTIC ACID, PLASMA: Lactic Acid, Venous: 2.3 mmol/L (ref 0.5–1.9)

## 2023-11-29 LAB — CK: Total CK: 501 U/L — ABNORMAL HIGH (ref 49–397)

## 2023-11-29 LAB — BETA-HYDROXYBUTYRIC ACID: Beta-Hydroxybutyric Acid: 2.06 mmol/L — ABNORMAL HIGH (ref 0.05–0.27)

## 2023-11-29 LAB — MAGNESIUM: Magnesium: 1.9 mg/dL (ref 1.7–2.4)

## 2023-11-29 LAB — PROTIME-INR
INR: 1.5 — ABNORMAL HIGH (ref 0.8–1.2)
Prothrombin Time: 19.2 s — ABNORMAL HIGH (ref 11.4–15.2)

## 2023-11-29 LAB — TROPONIN I (HIGH SENSITIVITY): Troponin I (High Sensitivity): 82 ng/L — ABNORMAL HIGH (ref <18)

## 2023-11-29 LAB — ETHANOL: Alcohol, Ethyl (B): 118 mg/dL — ABNORMAL HIGH (ref <15)

## 2023-11-29 MED ORDER — SODIUM CHLORIDE 0.9 % IV BOLUS
500.0000 mL | Freq: Once | INTRAVENOUS | Status: AC
  Administered 2023-11-29: 500 mL via INTRAVENOUS

## 2023-11-29 MED ORDER — ADULT MULTIVITAMIN W/MINERALS CH
1.0000 | ORAL_TABLET | Freq: Every day | ORAL | Status: DC
  Administered 2023-11-29 – 2023-12-02 (×4): 1 via ORAL
  Filled 2023-11-29 (×4): qty 1

## 2023-11-29 MED ORDER — POTASSIUM CHLORIDE 10 MEQ/100ML IV SOLN
10.0000 meq | Freq: Once | INTRAVENOUS | Status: AC
  Administered 2023-11-29: 10 meq via INTRAVENOUS
  Filled 2023-11-29: qty 100

## 2023-11-29 MED ORDER — POTASSIUM CHLORIDE 20 MEQ PO PACK
40.0000 meq | PACK | ORAL | Status: AC
  Administered 2023-11-29: 40 meq via ORAL
  Filled 2023-11-29: qty 2

## 2023-11-29 MED ORDER — THIAMINE MONONITRATE 100 MG PO TABS
100.0000 mg | ORAL_TABLET | Freq: Every day | ORAL | Status: DC
  Administered 2023-11-29 – 2023-12-02 (×4): 100 mg via ORAL
  Filled 2023-11-29 (×4): qty 1

## 2023-11-29 MED ORDER — LORAZEPAM 2 MG/ML IJ SOLN: 1.0000 mg | INTRAMUSCULAR | Status: DC | PRN

## 2023-11-29 MED ORDER — FOLIC ACID 1 MG PO TABS
1.0000 mg | ORAL_TABLET | Freq: Every day | ORAL | Status: DC
  Administered 2023-11-29 – 2023-12-02 (×4): 1 mg via ORAL
  Filled 2023-11-29 (×4): qty 1

## 2023-11-29 MED ORDER — LORAZEPAM 1 MG PO TABS
1.0000 mg | ORAL_TABLET | ORAL | Status: DC | PRN
  Administered 2023-12-02: 1 mg via ORAL
  Filled 2023-11-29: qty 1

## 2023-11-29 MED ORDER — SODIUM CHLORIDE 0.9% FLUSH
3.0000 mL | Freq: Two times a day (BID) | INTRAVENOUS | Status: DC
  Administered 2023-11-29 – 2023-12-02 (×5): 3 mL via INTRAVENOUS

## 2023-11-29 MED ORDER — SENNOSIDES-DOCUSATE SODIUM 8.6-50 MG PO TABS: 1.0000 | ORAL_TABLET | Freq: Every evening | ORAL | Status: DC | PRN

## 2023-11-29 NOTE — ED Notes (Signed)
 Red top sent to lab with temp label

## 2023-11-29 NOTE — ED Notes (Signed)
 Pt disposable brief wet.  Pt provided with pericare and clean dry brief.  Per pt, he is unable to use urinal and unable to walk to toilet at this time.

## 2023-11-29 NOTE — ED Provider Notes (Signed)
 [facility] Provider Note    Event Date/Time   First MD Initiated Contact with Patient 11/29/23 1630     (approximate)   History   Abdominal Pain   HPI  Christian Clark is a 78 y.o. male   with a history of atrial fibrillation, prior stroke with debility, CHF, alcohol abuse  Patient here as he has not been feeling well reports that he just not had much appetite poor appetite fatigue.  Not eating or drinking well.  Yesterday he vomited a couple times nonbloody.  He is continue to feel like when he eats something health want to throw it up but was able to eat a small amount and fluid today.  He also reports that he drinks alcohol had drank yesterday also today.  He is a daily drinker of alcohol.  He has not had any headaches chest pain shortness of breath.  No confusion.  His caretaker is with him advises that he had considered coming to the hospital last night when he was vomiting but did not, and did ultimately decide to come today  He denies any history of known liver disease      Physical Exam   Triage Vital Signs: ED Triage Vitals  Encounter Vitals Group     BP 11/29/23 1523 139/81     Girls Systolic BP Percentile --      Girls Diastolic BP Percentile --      Boys Systolic BP Percentile --      Boys Diastolic BP Percentile --      Pulse Rate 11/29/23 1523 (!) 49     Resp 11/29/23 1523 18     Temp 11/29/23 1523 98.1 F (36.7 C)     Temp Source 11/29/23 1523 Oral     SpO2 11/29/23 1523 95 %     Weight 11/29/23 1523 223 lb (101.2 kg)     Height 11/29/23 1522 5' 10 (1.778 m)     Head Circumference --      Peak Flow --      Pain Score 11/29/23 1531 0     Pain Loc --      Pain Education --      Exclude from Growth Chart --     Most recent vital signs: Vitals:   11/29/23 1901 11/29/23 1930  BP:  129/66  Pulse: (!) 55 (!) 44  Resp: 19 18  Temp:    SpO2: 95% 97%     General: Awake, no distress.  He appears dry mucous membranes  are dry.  He is alert and well-oriented.  He is not in any distress but he was repairs what looks like almost dried vomitus around the edges of his mouth and is not bloody. CV:  Good peripheral perfusion.  Bradycardic slightly irregular.  Warm well-perfused extremities he is on cardiac monitoring Resp:  Normal effort.  Clear lungs bilateral normal work of breathing Abd:  No distention.  Not overtly distended with fluid wave but does appear slightly protuberant.  He denies pain to palpation in the abdominal quadrants at this time. Other:  He has mild jaundice.   ED Results / Procedures / Treatments   Labs (all labs ordered are listed, but only abnormal results are displayed) Labs Reviewed  COMPREHENSIVE METABOLIC PANEL WITH GFR - Abnormal; Notable for the following components:      Result Value   Potassium 2.9 (*)    Chloride 86 (*)    Glucose, Bld 106 (*)  BUN 28 (*)    Creatinine, Ser 1.40 (*)    Total Protein 9.2 (*)    AST 138 (*)    ALT 82 (*)    Total Bilirubin 2.4 (*)    GFR, Estimated 52 (*)    Anion gap 21 (*)    All other components within normal limits  CBC - Abnormal; Notable for the following components:   Platelets 130 (*)    All other components within normal limits  LACTIC ACID, PLASMA - Abnormal; Notable for the following components:   Lactic Acid, Venous 2.3 (*)    All other components within normal limits  BETA-HYDROXYBUTYRIC ACID - Abnormal; Notable for the following components:   Beta-Hydroxybutyric Acid 2.06 (*)    All other components within normal limits  ETHANOL - Abnormal; Notable for the following components:   Alcohol, Ethyl (B) 118 (*)    All other components within normal limits  CK - Abnormal; Notable for the following components:   Total CK 501 (*)    All other components within normal limits  PROTIME-INR - Abnormal; Notable for the following components:   Prothrombin Time 19.2 (*)    INR 1.5 (*)    All other components within normal limits   TROPONIN I (HIGH SENSITIVITY) - Abnormal; Notable for the following components:   Troponin I (High Sensitivity) 82 (*)    All other components within normal limits  LIPASE, BLOOD  AMMONIA  MAGNESIUM   URINALYSIS, ROUTINE W REFLEX MICROSCOPIC  MAGNESIUM   CBC  COMPREHENSIVE METABOLIC PANEL WITH GFR   Labs reviewed.  Patient has multiple lab abnormalities including appears to be mild AKI hypokalemia hypochloremia elevated anion gap with potential cause due to elevated beta hydroxybutyrate, lactic acid ethanol multifactorial.  Normal ammonia  LFTs concerning with elevated bilirubin in the setting of AST greater than ALT in the setting of his symptoms and imaging studies heavy history of alcohol abuse I am quite concerned he may have acute hepatocellular disease such as cirrhosis but does not have evidence of acute failure.  EKG  And inter by me at 1835 heart rate 50 QRS 100 QTc 480 Atrial fibrillation with slightly slow response.  There is mild but obviously present ST segment abnormality seen in precordial leads with slight depressions laterally.  He has no associated chest pain or cardiovascular complaint at this time however.  May be representative demand ischemia, will add troponin   RADIOLOGY  CT ABDOMEN PELVIS WO CONTRAST Result Date: 11/29/2023 EXAM: CT ABDOMEN AND PELVIS WITHOUT CONTRAST 11/29/2023 06:06:57 PM TECHNIQUE: CT of the abdomen and pelvis was performed without the administration of intravenous contrast. Multiplanar reformatted images are provided for review. Automated exposure control, iterative reconstruction, and/or weight-based adjustment of the mA/kV was utilized to reduce the radiation dose to as low as reasonably achievable. COMPARISON: None available. CLINICAL HISTORY: Bowel obstruction suspected; abd pain, vomiting yesterday, jaundiced on exam (? liver). FINDINGS: LOWER CHEST: No acute abnormality. LIVER: Hepatic steatosis. Mild nodularity of the hepatic contour  suggesting early cirrhosis. GALLBLADDER AND BILE DUCTS: Gallbladder is unremarkable. No biliary ductal dilatation. SPLEEN: No acute abnormality. PANCREAS: No acute abnormality. ADRENAL GLANDS: No acute abnormality. KIDNEYS, URETERS AND BLADDER: No stones in the kidneys or ureters. No hydronephrosis. Nonspecific symmetric bilateral perinephric stranding. Urinary bladder is unremarkable. GI AND BOWEL: Stomach demonstrates no acute abnormality. Colonic diverticulosis without diverticulitis. Normal appendix. Herniation of a loop of small bowel into the fat containing umbilical hernia. No obstruction or strangulation. SABRA PERITONEUM AND RETROPERITONEUM: No  ascites. No free air. VASCULATURE: Aorta is normal in caliber. Aortic atherosclerotic calcification. LYMPH NODES: No lymphadenopathy. REPRODUCTIVE ORGANS: No acute abnormality. BONES AND SOFT TISSUES: Small fat-containing bilateral inguinal hernias. No acute osseous abnormality. Chronic superior endplate compression fracture of L1. IMPRESSION: 1. No acute findings in the abdomen or pelvis. 2. Herniation of a nonobstructed loop of small bowel in the umbilical hernia. 3. Hepatic steatosis and early cirrhosis. Electronically signed by: Norman Gatlin MD 11/29/2023 06:28 PM EST RP Workstation: [id number]    CT imaging interpreted by me is grossly negative for obvious acute finding though the liver does appear abnormal.  He does have an umbilical hernia on exam but is soft reproducible on chronically present   PROCEDURES:  Critical Care performed: Yes, see critical care procedure note(s)  CRITICAL CARE Performed by: Oneil Budge   Total critical care time: 30 minutes  Critical care time was exclusive of separately billable procedures and treating other patients.  Critical care was necessary to treat or prevent imminent or life-threatening deterioration.  Critical care was time spent personally by me on the following activities: development of treatment plan  with patient and/or surrogate as well as nursing, discussions with consultants, evaluation of patient's response to treatment, examination of patient, obtaining history from patient or surrogate, ordering and performing treatments and interventions, ordering and review of laboratory studies, ordering and review of radiographic studies, pulse oximetry and re-evaluation of patient's condition.   Procedures   MEDICATIONS ORDERED IN ED: Medications  sodium chloride  flush (NS) 0.9 % injection 3 mL (has no administration in time range)  senna-docusate (Senokot-S) tablet 1 tablet (has no administration in time range)  folic acid  (FOLVITE ) tablet 1 mg (has no administration in time range)  multivitamin with minerals tablet 1 tablet (has no administration in time range)  thiamine  (VITAMIN B1) tablet 100 mg (has no administration in time range)  LORazepam  (ATIVAN ) tablet 1-4 mg (has no administration in time range)    Or  LORazepam  (ATIVAN ) injection 1-4 mg (has no administration in time range)  potassium chloride  10 mEq in 100 mL IVPB (0 mEq Intravenous Stopped 11/29/23 1935)  sodium chloride  0.9 % bolus 500 mL (500 mLs Intravenous New Bag/Given 11/29/23 1842)  potassium chloride  10 mEq in 100 mL IVPB (10 mEq Intravenous New Bag/Given 11/29/23 1948)  potassium chloride  (KLOR-CON ) packet 40 mEq (40 mEq Oral Given 11/29/23 1944)     IMPRESSION / MDM / ASSESSMENT AND PLAN / ED COURSE  I reviewed the triage vital signs and the nursing notes.                              Differential diagnosis includes, but is not limited to, possible significant multifactorial he is not febrile or hypotensive.  He has multiple electrolyte abnormalities and based on his clinical history and exam imaging etc. I am concerned for significant hepatocellular disease but not in florid failure.  He is awake alert oriented.  He reports vomiting decreased intake and I suspect some dehydration, will hydrate replete electrolytes.   Minimally elevated CK multiple findings that could contribute to elevated anion gap.  Also suspect possibly some element of starvation ketosis.  He has a minimally elevated troponin with ECG changes, but no acute chest pain which I suspect is more related to likely demand ischemic picture.  Continue to monitor carefully on cardiac monitor and replete electrolytes.  Does not have acute indication for cardiac pacing and he  is not on digoxin.  Patient's presentation is most consistent with acute presentation with potential threat to life or bodily function.  Patient has severe hypokalemia multiple electrolyte abnormalities elevated anion gap.  He also has mild bradycardia requiring IV repletion of his potassium with associated ECG abnormalities  The patient is on the cardiac monitor to evaluate for evidence of arrhythmia and/or significant heart rate changes.   ----------------------------------------- 8:57 PM on 11/29/2023 ----------------------------------------- Discussed with patient his results he is understanding agreeable plan for admission.  He is currently eating which is reassuring.  Receiving hydration and appears improving his heart rate currently in the 50s.  Consulted with accepted to hospital service by Dr. Fernand     FINAL CLINICAL IMPRESSION(S) / ED DIAGNOSES   Final diagnoses:  Hypokalemia  Atrial fibrillation with slow ventricular response (HCC)  EKG, abnormal  AKI (acute kidney injury)  Starvation ketoacidosis     Rx / DC Orders   ED Discharge Orders     None        Note:  This document was prepared using Dragon voice recognition software and may include unintentional dictation errors.   Dicky Anes, MD 11/29/23 2059

## 2023-11-29 NOTE — ED Notes (Signed)
 EDP Quale  made aware of pt heart rate dipping into 30's unsustained.  EDP evaluated pt at bedside. No new orders at this time.

## 2023-11-29 NOTE — ED Triage Notes (Signed)
 Pt to ED with home health aide for c/o abd pain and not feeling well. Per aide, pt had a couple drinks last night and took trazodone . Pt states he had two drinks of vodka last night and two drinks today. Pt states last drink was around noon. Pt has hx dementia, alert to self, disoriented to time and situation.

## 2023-11-29 NOTE — H&P (Incomplete)
 History and Physical    Christian Clark FMW:[id number] DOB: August 20, 1945 DOA: 11/29/2023  DOS: the patient was seen and examined on 11/29/2023  PCP: Lenon Layman ORN, MD   Patient coming from: Home  I have personally briefly reviewed patient's old medical records in [facility] Link and CareEverywhere  HPI:   Christian Clark is a 78 y.o. year old male with medical history of hypertension, hyperlipidemia, alcohol use disorder, polysubstance use disorder, paroxysmal A-fib, with past medical history of stroke presenting to the ED with feeling unwell overall and abdominal pain.   Pt states   On arrival to the ED patient was noted to be HDS stable.  Lab work and imaging obtained.  CBC without leukocytosis and mild thrombocytopenia.  Ethanol level at 118, CMP with an AKI, hypokalemia, anion gap metabolic acidosis and elevated LFTs.  Ammonia level checked and normal, BHB elevated at 2.06, CK elevated at 501.  Lactic acid mildly elevated at 2.3.  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  [facility] contacted for admission.  Review of Systems: As mentioned in the history of present illness. All other systems reviewed and are negative.   Past Medical History:  Diagnosis Date   GERD (gastroesophageal reflux disease)    Substance abuse (HCC)     Past Surgical History:  Procedure Laterality Date   JOINT REPLACEMENT       Allergies  Allergen Reactions   Shrimp Extract    Chocolate Hives and Itching   Dust Mite Extract    Iodine Hives   Iodine Strong    Peanut Oil Hives and Itching   Penicillins Hives   Shellfish Allergy Hives    Family History  Problem Relation Age of Onset   Atrial fibrillation Sister     Prior to Admission medications   Medication Sig Start Date End Date Taking? Authorizing Provider  acetaminophen  (TYLENOL )  500 MG tablet Take 1 tablet (500 mg total) by mouth every 6 (six) hours as needed for moderate pain or mild pain. 09/10/21   Pokhrel, Laxman, MD  atorvastatin  (LIPITOR ) 80 MG tablet Take 80 mg by mouth daily.    [provider]  carvedilol  (COREG ) 25 MG tablet Take 1 tablet (25 mg total) by mouth 2 (two) times daily with a meal. 10/07/23   Tobie Calix, MD  cetirizine  (ZYRTEC ) 10 MG tablet Take 1 tablet (10 mg total) by mouth daily. 09/10/21 12/09/21  Pokhrel, Laxman, MD  dapagliflozin  propanediol (FARXIGA ) 10 MG TABS tablet Take 1 tablet (10 mg total) by mouth daily. 10/08/23   Patel, Sona, MD  ELIQUIS  5 MG TABS tablet Take 1 tablet (5 mg total) by mouth 2 (two) times daily. 09/10/21 10/06/23  Pokhrel, Laxman, MD  esomeprazole  (NEXIUM ) 40 MG capsule Take 1 capsule (40 mg total) by mouth daily. 09/10/21 12/09/21  Pokhrel, Laxman, MD  furosemide  (LASIX ) 20 MG tablet Take 1 tablet (20 mg total) by mouth daily. 10/08/23   Patel, Sona, MD  hydrocortisone  cream 1 % Apply topically 3 (three) times daily. 10/07/23   Patel, Sona, MD  traZODone  (DESYREL ) 50 MG tablet Take 1 tablet (50 mg total) by mouth at bedtime as needed for sleep. 09/10/21 12/09/21  Pokhrel, Vernal, MD    Social History:  reports that he quit smoking about 15 years ago. His smoking use included cigarettes. He started smoking about 35 years ago. He has  a 20 pack-year smoking history. He has never used smokeless tobacco. He reports current alcohol use. He reports that he does not use drugs. Lives with Tobacco- *** ppd x **** years since age/ Denies use. EtOH- *** shots/ week, *** beers/week. Last drink was ***. Denies use.  Illicit drug use- denies use.  IADLs/ADLs- can perform independently at baseline    Physical Exam: Vitals:   11/29/23 1700 11/29/23 1845 11/29/23 1900 11/29/23 1901  BP: (!) 153/88 138/65 132/74   Pulse: (!) 49 (!) 51 (!) 41 (!) 55  Resp: 18 19 17 19   Temp:      TempSrc:      SpO2: 93% 97% 96% 95%  Weight:       Height:         Gen: HENT: CV: Resp: Abd: MSK: Skin: Neuro: Psych:   Labs on Admission: I have personally reviewed following labs and imaging studies  CBC: Recent Labs  Lab 11/29/23 1535  WBC 6.6  HGB 14.2  HCT 42.8  MCV 89.7  PLT 130*   Basic Metabolic Panel: Recent Labs  Lab 11/29/23 1535  NA 138  K 2.9*  CL 86*  CO2 31  GLUCOSE 106*  BUN 28*  CREATININE 1.40*  CALCIUM  9.2   GFR: Estimated Creatinine Clearance: 52.7 mL/min (A) (by C-G formula based on SCr of 1.4 mg/dL (H)). Liver Function Tests: Recent Labs  Lab 11/29/23 1535  AST 138*  ALT 82*  ALKPHOS 75  BILITOT 2.4*  PROT 9.2*  ALBUMIN 4.2   Recent Labs  Lab 11/29/23 1535  LIPASE 46   Recent Labs  Lab 11/29/23 1739  AMMONIA <13   Coagulation Profile: Recent Labs  Lab 11/29/23 1830  INR 1.5*   Cardiac Enzymes: Recent Labs  Lab 11/29/23 1739  CKTOTAL 501*   BNP (last 3 results) Recent Labs    10/05/23 1731  BNP 236.9*   HbA1C: No results for input(s): HGBA1C in the last 72 hours. CBG: No results for input(s): GLUCAP in the last 168 hours. Lipid Profile: No results for input(s): CHOL, HDL, LDLCALC, TRIG, CHOLHDL, LDLDIRECT in the last 72 hours. Thyroid Function Tests: No results for input(s): TSH, T4TOTAL, FREET4, T3FREE, THYROIDAB in the last 72 hours. Anemia Panel: No results for input(s): VITAMINB12, FOLATE, FERRITIN, TIBC, IRON , RETICCTPCT in the last 72 hours. Urine analysis:    Component Value Date/Time   COLORURINE YELLOW 09/06/2021 1156   APPEARANCEUR CLEAR 09/06/2021 1156   LABSPEC 1.020 09/06/2021 1156   PHURINE 6.0 09/06/2021 1156   GLUCOSEU NEGATIVE 09/06/2021 1156   HGBUR TRACE (A) 09/06/2021 1156   BILIRUBINUR MODERATE (A) 09/06/2021 1156   KETONESUR 40 (A) 09/06/2021 1156   PROTEINUR NEGATIVE 09/06/2021 1156   NITRITE NEGATIVE 09/06/2021 1156   LEUKOCYTESUR TRACE (A) 09/06/2021 1156    Radiological  Exams on Admission: I have personally reviewed images CT ABDOMEN PELVIS WO CONTRAST Result Date: 11/29/2023 EXAM: CT ABDOMEN AND PELVIS WITHOUT CONTRAST 11/29/2023 06:06:57 PM TECHNIQUE: CT of the abdomen and pelvis was performed without the administration of intravenous contrast. Multiplanar reformatted images are provided for review. Automated exposure control, iterative reconstruction, and/or weight-based adjustment of the mA/kV was utilized to reduce the radiation dose to as low as reasonably achievable. COMPARISON: None available. CLINICAL HISTORY: Bowel obstruction suspected; abd pain, vomiting yesterday, jaundiced on exam (? liver). FINDINGS: LOWER CHEST: No acute abnormality. LIVER: Hepatic steatosis. Mild nodularity of the hepatic contour suggesting early cirrhosis. GALLBLADDER AND BILE DUCTS: Gallbladder is unremarkable. No biliary  ductal dilatation. SPLEEN: No acute abnormality. PANCREAS: No acute abnormality. ADRENAL GLANDS: No acute abnormality. KIDNEYS, URETERS AND BLADDER: No stones in the kidneys or ureters. No hydronephrosis. Nonspecific symmetric bilateral perinephric stranding. Urinary bladder is unremarkable. GI AND BOWEL: Stomach demonstrates no acute abnormality. Colonic diverticulosis without diverticulitis. Normal appendix. Herniation of a loop of small bowel into the fat containing umbilical hernia. No obstruction or strangulation. SABRA PERITONEUM AND RETROPERITONEUM: No ascites. No free air. VASCULATURE: Aorta is normal in caliber. Aortic atherosclerotic calcification. LYMPH NODES: No lymphadenopathy. REPRODUCTIVE ORGANS: No acute abnormality. BONES AND SOFT TISSUES: Small fat-containing bilateral inguinal hernias. No acute osseous abnormality. Chronic superior endplate compression fracture of L1. IMPRESSION: 1. No acute findings in the abdomen or pelvis. 2. Herniation of a nonobstructed loop of small bowel in the umbilical hernia. 3. Hepatic steatosis and early cirrhosis. Electronically  signed by: Norman Gatlin MD 11/29/2023 06:28 PM EST RP Workstation: [id number]    EKG: My personal interpretation of EKG shows: Atrial fibrillation, no acute ST changes.  Rate in the 50s.    Assessment/Plan Active Problems:   * No active hospital problems. *     VTE prophylaxis:  Lovenox  Diet: Regular Code Status:  Full Code Telemetry:  Admission status: Inpatient, Progressive Patient is from: Home Anticipated d/c is to: Home Anticipated d/c is in: 3-4 days   Family Communication: Updated at bedside  Consults called: None   Severity of Illness: The appropriate patient status for this patient is INPATIENT. Inpatient status is judged to be reasonable and necessary in order to provide the required intensity of service to ensure the patient's safety. The patient's presenting symptoms, physical exam findings, and initial radiographic and laboratory data in the context of their chronic comorbidities is felt to place them at high risk for further clinical deterioration. Furthermore, it is not anticipated that the patient will be medically stable for discharge from the hospital within 2 midnights of admission.   * I certify that at the point of admission it is my clinical judgment that the patient will require inpatient hospital care spanning beyond 2 midnights from the point of admission due to high intensity of service, high risk for further deterioration and high frequency of surveillance required.DEWAINE Morene Bathe, MD [place] DEL. [facility]

## 2023-11-29 NOTE — ED Notes (Signed)
 Pt gone to CT

## 2023-11-30 ENCOUNTER — Inpatient Hospital Stay

## 2023-11-30 DIAGNOSIS — E872 Acidosis, unspecified: Secondary | ICD-10-CM | POA: Insufficient documentation

## 2023-11-30 DIAGNOSIS — E876 Hypokalemia: Principal | ICD-10-CM | POA: Insufficient documentation

## 2023-11-30 DIAGNOSIS — R001 Bradycardia, unspecified: Secondary | ICD-10-CM | POA: Diagnosis not present

## 2023-11-30 DIAGNOSIS — N1831 Chronic kidney disease, stage 3a: Secondary | ICD-10-CM | POA: Insufficient documentation

## 2023-11-30 DIAGNOSIS — I482 Chronic atrial fibrillation, unspecified: Secondary | ICD-10-CM | POA: Diagnosis not present

## 2023-11-30 DIAGNOSIS — K219 Gastro-esophageal reflux disease without esophagitis: Secondary | ICD-10-CM

## 2023-11-30 DIAGNOSIS — F1010 Alcohol abuse, uncomplicated: Secondary | ICD-10-CM

## 2023-11-30 DIAGNOSIS — R109 Unspecified abdominal pain: Secondary | ICD-10-CM

## 2023-11-30 DIAGNOSIS — D696 Thrombocytopenia, unspecified: Secondary | ICD-10-CM | POA: Insufficient documentation

## 2023-11-30 DIAGNOSIS — R1084 Generalized abdominal pain: Secondary | ICD-10-CM

## 2023-11-30 DIAGNOSIS — R7989 Other specified abnormal findings of blood chemistry: Secondary | ICD-10-CM | POA: Insufficient documentation

## 2023-11-30 DIAGNOSIS — I5032 Chronic diastolic (congestive) heart failure: Secondary | ICD-10-CM | POA: Insufficient documentation

## 2023-11-30 LAB — CBC
HCT: 38.8 % — ABNORMAL LOW (ref 39.0–52.0)
Hemoglobin: 12.7 g/dL — ABNORMAL LOW (ref 13.0–17.0)
MCH: 29.5 pg (ref 26.0–34.0)
MCHC: 32.7 g/dL (ref 30.0–36.0)
MCV: 90.2 fL (ref 80.0–100.0)
Platelets: 113 K/uL — ABNORMAL LOW (ref 150–400)
RBC: 4.3 MIL/uL (ref 4.22–5.81)
RDW: 14.1 % (ref 11.5–15.5)
WBC: 6.4 K/uL (ref 4.0–10.5)
nRBC: 0 % (ref 0.0–0.2)

## 2023-11-30 LAB — COMPREHENSIVE METABOLIC PANEL WITH GFR
ALT: 63 U/L — ABNORMAL HIGH (ref 0–44)
AST: 104 U/L — ABNORMAL HIGH (ref 15–41)
Albumin: 3.6 g/dL (ref 3.5–5.0)
Alkaline Phosphatase: 66 U/L (ref 38–126)
Anion gap: 20 — ABNORMAL HIGH (ref 5–15)
BUN: 27 mg/dL — ABNORMAL HIGH (ref 8–23)
CO2: 26 mmol/L (ref 22–32)
Calcium: 8.6 mg/dL — ABNORMAL LOW (ref 8.9–10.3)
Chloride: 89 mmol/L — ABNORMAL LOW (ref 98–111)
Creatinine, Ser: 1.31 mg/dL — ABNORMAL HIGH (ref 0.61–1.24)
GFR, Estimated: 56 mL/min — ABNORMAL LOW (ref >60)
Glucose, Bld: 117 mg/dL — ABNORMAL HIGH (ref 70–99)
Potassium: 3.1 mmol/L — ABNORMAL LOW (ref 3.5–5.1)
Sodium: 135 mmol/L (ref 135–145)
Total Bilirubin: 3 mg/dL — ABNORMAL HIGH (ref 0.0–1.2)
Total Protein: 7.2 g/dL (ref 6.5–8.1)

## 2023-11-30 LAB — URINALYSIS, ROUTINE W REFLEX MICROSCOPIC
Bacteria, UA: NONE SEEN
Bilirubin Urine: NEGATIVE
Glucose, UA: >=500 mg/dL — AB
Ketones, ur: 5 mg/dL — AB
Leukocytes,Ua: NEGATIVE
Nitrite: NEGATIVE
Protein, ur: 30 mg/dL — AB
Specific Gravity, Urine: 1.014 (ref 1.005–1.030)
Squamous Epithelial / HPF: 0 /HPF (ref 0–5)
pH: 5 (ref 5.0–8.0)

## 2023-11-30 LAB — LACTIC ACID, PLASMA
Lactic Acid, Venous: 0.9 mmol/L (ref 0.5–1.9)
Lactic Acid, Venous: 1.6 mmol/L (ref 0.5–1.9)

## 2023-11-30 LAB — PHOSPHORUS: Phosphorus: 4 mg/dL (ref 2.5–4.6)

## 2023-11-30 LAB — CBG MONITORING, ED: Glucose-Capillary: 97 mg/dL (ref 70–99)

## 2023-11-30 MED ORDER — FUROSEMIDE 20 MG PO TABS
20.0000 mg | ORAL_TABLET | Freq: Every day | ORAL | Status: DC
  Administered 2023-11-30 – 2023-12-02 (×3): 20 mg via ORAL
  Filled 2023-11-30 (×3): qty 1

## 2023-11-30 MED ORDER — CARVEDILOL 25 MG PO TABS: 25.0000 mg | ORAL_TABLET | Freq: Two times a day (BID) | ORAL | Status: DC

## 2023-11-30 MED ORDER — POTASSIUM CHLORIDE 10 MEQ/100ML IV SOLN
10.0000 meq | INTRAVENOUS | Status: AC
  Administered 2023-11-30 (×2): 10 meq via INTRAVENOUS
  Filled 2023-11-30 (×2): qty 100

## 2023-11-30 MED ORDER — LACTATED RINGERS IV SOLN: INTRAVENOUS | Status: AC

## 2023-11-30 MED ORDER — DAPAGLIFLOZIN PROPANEDIOL 10 MG PO TABS
10.0000 mg | ORAL_TABLET | Freq: Every day | ORAL | Status: DC
  Administered 2023-11-30: 10 mg via ORAL
  Filled 2023-11-30: qty 1

## 2023-11-30 MED ORDER — APIXABAN 5 MG PO TABS
5.0000 mg | ORAL_TABLET | Freq: Two times a day (BID) | ORAL | Status: DC
  Administered 2023-11-30 – 2023-12-02 (×5): 5 mg via ORAL
  Filled 2023-11-30 (×5): qty 1

## 2023-11-30 MED ORDER — POTASSIUM CHLORIDE CRYS ER 20 MEQ PO TBCR
40.0000 meq | EXTENDED_RELEASE_TABLET | Freq: Two times a day (BID) | ORAL | Status: DC
  Administered 2023-11-30 – 2023-12-02 (×5): 40 meq via ORAL
  Filled 2023-11-30 (×5): qty 2

## 2023-11-30 MED ORDER — ACETAMINOPHEN 325 MG PO TABS
650.0000 mg | ORAL_TABLET | Freq: Four times a day (QID) | ORAL | Status: AC | PRN
  Administered 2023-11-30: 650 mg via ORAL
  Filled 2023-11-30: qty 2

## 2023-11-30 MED ORDER — PANTOPRAZOLE SODIUM 40 MG PO TBEC
40.0000 mg | DELAYED_RELEASE_TABLET | Freq: Every day | ORAL | Status: DC
  Administered 2023-11-30 – 2023-12-02 (×3): 40 mg via ORAL
  Filled 2023-11-30 (×3): qty 1

## 2023-11-30 NOTE — Evaluation (Signed)
 Occupational Therapy Evaluation Patient Details Name: Christian Clark MRN: [id number] DOB: January 17, 1946 Today's Date: 11/30/2023   History of Present Illness   Elior Day Victory is a 78 y.o. year old male with medical history of hypertension, hyperlipidemia, alcohol use disorder, polysubstance use disorder, paroxysmal A-fib, with past medical history of stroke presenting to the ED with feeling unwell overall and abdominal pain.   Clinical Impressions Mr Renda was seen for OT evaluation this date. Prior to hospital admission, pt was MOD I using RW, assist for bathing/dressing from aids. Pt lives alone with [facility] aids available 8pm-8am, 12-4pm daily. Pt currently requires MIN A for LB access and MOD A don/doff brief in standing - reports baseline. SUPERVISION + RW for ADL t/f ~150 ft. Pt appears near baseline functional independence, no skilled acute OT needs, identified, will sign off. Upon hospital discharge, recommend no OT follow up.     If plan is discharge home, recommend the following:   A little help with walking and/or transfers;A little help with bathing/dressing/bathroom;Help with stairs or ramp for entrance     Functional Status Assessment   Patient has not had a recent decline in their functional status     Equipment Recommendations   None recommended by OT     Recommendations for Other Services         Precautions/Restrictions   Precautions Precautions: None Recall of Precautions/Restrictions: Intact Restrictions Weight Bearing Restrictions Per Provider Order: No     Mobility Bed Mobility Overal bed mobility: Needs Assistance Bed Mobility: Supine to Sit, Sit to Supine     Supine to sit: Min assist Sit to supine: Min assist        Transfers Overall transfer level: Needs assistance Equipment used: Rolling walker (2 wheels) Transfers: Sit to/from Stand Sit to Stand: Supervision                  Balance Overall balance assessment:  Needs assistance Sitting-balance support: No upper extremity supported, Feet supported Sitting balance-[name]: Good     Standing balance support: No upper extremity supported, During functional activity Standing balance-[name]: Fair                             ADL either performed or assessed with clinical judgement   ADL Overall ADL's : Needs assistance/impaired                                       General ADL Comments: MIN A for LB access and MOD A don/doff brief in standing - reprots baseline. SUPERVISION + RW for ADL t/f ~150 ft      Pertinent Vitals/Pain Pain Assessment Pain Assessment: No/denies pain     Extremity/Trunk Assessment Upper Extremity Assessment Upper Extremity Assessment: Overall WFL for tasks assessed   Lower Extremity Assessment Lower Extremity Assessment: Generalized weakness       Communication Communication Communication: No apparent difficulties   Cognition Arousal: Alert Behavior During Therapy: WFL for tasks assessed/performed Cognition: No apparent impairments                               Following commands: Intact                  Home Living Family/patient expects to be discharged to:: Private residence  Living Arrangements: Alone Available Help at Discharge: Personal care attendant Type of Home: House Home Access: Stairs to enter [employer] of Steps: 3 Entrance Stairs-Rails: Right Home Layout: One level     Bathroom Shower/Tub: Producer, Television/film/video: Standard     Home Equipment: Agricultural Consultant (2 wheels);BSC/3in1;Shower seat   Additional Comments: reports PCA 8pm-8am and 12-4pm 7days/week      Prior Functioning/Environment Prior Level of Function : Needs assist             Mobility Comments: RW limited community distances ADLs Comments: aids assist with IADLs and bathing    OT Problem List: Decreased activity tolerance         OT Goals(Current goals can be found in the care plan section)   Acute Rehab OT Goals Patient Stated Goal: to go home OT Goal Formulation: With patient Time For Goal Achievement: 11/30/23 Potential to Achieve Goals: Good   AM-PAC OT 6 Clicks Daily Activity     Outcome Measure Help from another person eating meals?: None Help from another person taking care of personal grooming?: None Help from another person toileting, which includes using toliet, bedpan, or urinal?: None Help from another person bathing (including washing, rinsing, drying)?: A Lot Help from another person to put on and taking off regular upper body clothing?: None Help from another person to put on and taking off regular lower body clothing?: A Lot 6 Click Score: 20   End of Session Equipment Utilized During Treatment: Rolling walker (2 wheels) Nurse Communication: Mobility status  Activity Tolerance: Patient tolerated treatment well Patient left: in bed;with call bell/phone within reach  OT Visit Diagnosis: Other abnormalities of gait and mobility (R26.89);Muscle weakness (generalized) (M62.81)                Time: 1030-1053 OT Time Calculation (min): 23 min Charges:  OT General Charges $OT Visit: 1 Visit OT Evaluation $OT Eval Low Complexity: 1 Low OT Treatments $Self Care/Home Management : 8-22 mins  Elston Slot, M.S. OTR/L  11/30/23, 11:16 AM  ascom [phone]

## 2023-11-30 NOTE — ED Notes (Signed)
 Pt given black coffee as requested at this time.

## 2023-11-30 NOTE — Assessment & Plan Note (Signed)
 Continue to monitor

## 2023-11-30 NOTE — Procedures (Signed)
 Interventional Radiology asked to evaluate patient for possible paracentesis. Limited ultrasound examination of the abdomen showed no ascites. No procedure performed. Images saved in Epic.   [name], AGACNP-BC 11/30/2023, 8:52 AM

## 2023-11-30 NOTE — TOC Initial Note (Signed)
 Transition of Care [facility]) - Initial/Assessment Note    Patient Details  Name: Christian Clark MRN: [id number] Date of Birth: 1945/12/11  Transition of Care [facility]) CM/SW Contact:    Jontrell Bushong L Radames Mejorado, LCSW Phone Number: 11/30/2023, 8:55 AM  Clinical Narrative:                  [facility] consult received for substance abuse counseling/education. TOC does not provide counseling/education. Resources have been added to the AVS.        Patient Goals and CMS Choice            Expected Discharge Plan and Services                                              Prior Living Arrangements/Services                       Activities of Daily Living      Permission Sought/Granted                  Emotional Assessment              Admission diagnosis:  Failure to thrive in adult [R62.7] Patient Active Problem List   Diagnosis Date Noted   Failure to thrive in adult 11/29/2023   Acute exacerbation of CHF (congestive heart failure) (HCC) 10/05/2023   Chronic systolic CHF (congestive heart failure) (HCC) 09/06/2021   Acute CVA (cerebrovascular accident) (HCC) 06/04/2021   CVA (cerebral vascular accident) (HCC) 06/03/2021   Iron  deficiency anemia due to chronic blood loss 06/03/2021   GERD (gastroesophageal reflux disease)    Frequent falls 11/28/2020   Atrial fibrillation, chronic (HCC) 11/28/2020   Sepsis (HCC) 11/28/2020   Alcohol abuse 11/28/2020   PCP:  Lenon Layman ORN, MD Pharmacy:   [id number] - [place], [place] - [address] AT [facility] [address] [facility] [address] [place] [address] Phone: [phone] Fax: [phone]  [facility] - [place], [place] - [address] [place] [address] Phone: [phone] Fax: [phone]     Social Drivers of Health (SDOH) Social History: SDOH Screenings   Food Insecurity: No Food Insecurity (11/18/2023)   Received from  [facility]  Housing: Low Risk  (11/18/2023)   Received from [facility]  Recent Concern: Housing - High Risk (10/06/2023)  Transportation Needs: No Transportation Needs (11/18/2023)   Received from [facility]  Utilities: Not At Risk (11/18/2023)   Received from [facility]  Financial Resource Strain: Low Risk  (11/18/2023)   Received from [facility]  Social Connections: Moderately Integrated (10/06/2023)  Tobacco Use: Medium Risk (11/29/2023)   SDOH Interventions:     Readmission Risk Interventions     No data to display

## 2023-11-30 NOTE — ED Notes (Signed)
 Pt O2 noted to be in 80's.  Upon assessment, pt sleeping but easily awakened, 2LNC applied. O2 96% at this time.  MD Cleatus made aware.

## 2023-11-30 NOTE — Assessment & Plan Note (Addendum)
 Unclear etiology.  Generalized in nature.  CT scan did not show any etiology.  Patient does not have ascites.  Try to advance diet and see how things go.  Advance diet and see what happens.

## 2023-11-30 NOTE — ED Notes (Signed)
 PT at bedside with patient

## 2023-11-30 NOTE — Evaluation (Signed)
 Physical Therapy Evaluation Patient Details Name: Christian Clark MRN: [id number] DOB: August 10, 1945 Today's Date: 11/30/2023  History of Present Illness  Pt is a 78 y/o M admitted on 11/29/23 after presenting with c/o feeling overall unwell & abdominal pain. Pt is being treated for failure to thrive. PMH: HTN, HLD, alcohol use disorder, polysubstance use disorder, paroxysmal a-fib, stroke, GERD  Clinical Impression  Pt seen for PT evaluation with pt agreeable to tx. Pt reports prior to admission he required assistance for transfers, stair negotiation, ambulatory with RW but denies falls. On this date, pt requires CGA<>mod assist for sit>stand depending on height of transfer surface, is able to ambulate with RW & CGA<>supervision. Pt with urinary incontinence multiple times during session, unaware (this is baseline) & PT assisted with changing gowns. Recommend ongoing PT services to address transfers & stair negotiation.        If plan is discharge home, recommend the following: A little help with walking and/or transfers;A little help with bathing/dressing/bathroom;Assistance with cooking/housework;Assist for transportation;Help with stairs or ramp for entrance   Can travel by private vehicle        Equipment Recommendations None recommended by PT (pt has RW & BSC)  Recommendations for Other Services       Functional Status Assessment Patient has had a recent decline in their functional status and demonstrates the ability to make significant improvements in function in a reasonable and predictable amount of time.     Precautions / Restrictions Precautions Precautions: Fall Restrictions Weight Bearing Restrictions Per Provider Order: No      Mobility  Bed Mobility Overal bed mobility: Modified Independent Bed Mobility: Supine to Sit     Supine to sit: Modified independent (Device/Increase time), HOB elevated, Used rails (cuing to not hold breath during supine>sit, pt with  great effort given to complete movement but reports it was easy)          Transfers Overall transfer level: Needs assistance Equipment used: Rolling walker (2 wheels) Transfers: Sit to/from Stand Sit to Stand: Contact guard assist, Mod assist           General transfer comment: sit>stand from elevated EOB with CGA, sit>stand from recliner with mod assist, cuing re: hand placement    Ambulation/Gait Ambulation/Gait assistance: Contact guard assist, Supervision Gait Distance (Feet): 165 Feet Assistive device: Rolling walker (2 wheels) Gait Pattern/deviations: Decreased step length - right, Decreased step length - left, Decreased stride length Gait velocity: slightly decreased     General Gait Details: pushes RW slightly out in front of him  Stairs            Wheelchair Mobility     Tilt Bed    Modified Rankin (Stroke Patients Only)       Balance Overall balance assessment: Needs assistance Sitting-balance support: No upper extremity supported, Feet supported Sitting balance-[name]: Good     Standing balance support: During functional activity, Bilateral upper extremity supported, Reliant on assistive device for balance Standing balance-[name]: Fair                               Pertinent Vitals/Pain Pain Assessment Pain Assessment: Faces Faces Pain Scale: Hurts whole lot Pain Location: R great toe Pain Descriptors / Indicators: Discomfort, Grimacing, Guarding Pain Intervention(s): Monitored during session, Limited activity within patient's tolerance    Home Living Family/patient expects to be discharged to:: Private residence Living Arrangements: Alone Available Help at Discharge:  Personal care attendant Type of Home: House Home Access: Stairs to enter Entrance Stairs-Rails: Right Entrance Stairs-Number of Steps: 3   Home Layout: One level Home Equipment: Agricultural Consultant (2 wheels);BSC/3in1;Shower seat Additional Comments:  reports PCA 8pm-8am and 12-4pm 7days/week    Prior Function Prior Level of Function : Needs assist             Mobility Comments: RW limited community distances, denies falls, reports he stands or sits for long periods of time until PCA can assist him with transfers ADLs Comments: aids assist with IADLs and bathing     Extremity/Trunk Assessment   Upper Extremity Assessment Upper Extremity Assessment: Overall WFL for tasks assessed    Lower Extremity Assessment Lower Extremity Assessment: Generalized weakness       Communication   Communication Communication: No apparent difficulties    Cognition Arousal: Alert Behavior During Therapy: WFL for tasks assessed/performed   PT - Cognitive impairments: Orientation   Orientation impairments: Time                   PT - Cognition Comments: oriented to month but not year Following commands: Intact       Cueing Cueing Techniques: Verbal cues     General Comments General comments (skin integrity, edema, etc.): Urinary incontinence multiple times throughout session & pt unaware, PT provided assistance for changing into clean gowns. Recommend purewick.    Exercises     Assessment/Plan    PT Assessment Patient needs continued PT services  PT Problem List Decreased strength;Decreased activity tolerance;Decreased balance;Decreased mobility;Decreased knowledge of use of DME;Decreased cognition       PT Treatment Interventions DME instruction;Balance training;Gait training;Neuromuscular re-education;Stair training;Functional mobility training;Patient/family education;Therapeutic activities;Therapeutic exercise;Manual techniques    PT Goals (Current goals can be found in the Care Plan section)  Acute Rehab PT Goals Patient Stated Goal: get better PT Goal Formulation: With patient Time For Goal Achievement: 12/14/23 Potential to Achieve Goals: Good    Frequency Min 2X/week     Co-evaluation                AM-PAC PT 6 Clicks Mobility  Outcome Measure Help needed turning from your back to your side while in a flat bed without using bedrails?: None Help needed moving from lying on your back to sitting on the side of a flat bed without using bedrails?: A Little Help needed moving to and from a bed to a chair (including a wheelchair)?: A Little Help needed standing up from a chair using your arms (e.g., wheelchair or bedside chair)?: A Lot Help needed to walk in hospital room?: A Little Help needed climbing 3-5 steps with a railing? : A Lot 6 Click Score: 17    End of Session   Activity Tolerance: Patient tolerated treatment well Patient left: in chair;with nursing/sitter in room;with call bell/phone within reach (reviewed use of call bell) Nurse Communication: Mobility status PT Visit Diagnosis: Unsteadiness on feet (R26.81);Muscle weakness (generalized) (M62.81)    Time: 8780-8741 PT Time Calculation (min) (ACUTE ONLY): 39 min   Charges:   PT Evaluation $PT Eval Low Complexity: 1 Low PT Treatments $Therapeutic Activity: 8-22 mins PT General Charges $$ ACUTE PT VISIT: 1 Visit         Richerd Pinal, PT, DPT 11/30/23, 1:16 PM   Richerd CHRISTELLA Pinal 11/30/2023, 1:15 PM

## 2023-11-30 NOTE — ED Notes (Signed)
 NURSE LINDSEY INFORMED OF BED ASSIGNED

## 2023-11-30 NOTE — Assessment & Plan Note (Signed)
 Alcohol withdrawal protocol continue folic acid  and thiamine .  No signs of withdrawal currently.  Patient also has thrombocytopenia, elevated liver function test and imaging that could go along with hepatic steatosis versus early cirrhosis.

## 2023-11-30 NOTE — Hospital Course (Signed)
 78 y.o. year old male with medical history of hypertension, hyperlipidemia, alcohol use disorder, polysubstance use disorder, paroxysmal A-fib, with past medical history of stroke presenting to the ED with feeling unwell overall and abdominal pain.     Pt states states she was feeling unwell and has p.o. intake was low today decided to get evaluated.  He also developed epigastric pain that has resolved.  He denies any other symptoms such as coughing, dysuria, nausea or vomiting.     On arrival to the ED patient was noted to be HDS stable.  Lab work and imaging obtained.  CBC without leukocytosis and mild thrombocytopenia.  Ethanol level at 118, CMP with an AKI, hypokalemia, anion gap metabolic acidosis and elevated LFTs.  Ammonia level checked and normal, BHB elevated at 2.06, CK elevated at 501.  Lactic acid mildly elevated at 2.3.  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   TRH contacted for admission.  11/11.  Patient complaining of abdominal pain and poor appetite.  Patient does drink alcohol.  CT scan concerning for early cirrhosis versus hepatic steatosis.  Wants to try regular diet.  Heart rates in the 30s and 40s will hold Coreg .

## 2023-11-30 NOTE — Assessment & Plan Note (Signed)
 Seen redosed with advance diet

## 2023-11-30 NOTE — ED Notes (Signed)
 Pt provided with pericare and clean dry disposable brief d/t urinary incontinence.

## 2023-11-30 NOTE — Assessment & Plan Note (Signed)
Likely secondary to alcohol. 

## 2023-11-30 NOTE — Assessment & Plan Note (Signed)
 Replace potassium orally and IV

## 2023-11-30 NOTE — Progress Notes (Signed)
 Progress Note   Patient: Christian Clark FMW:[id number] DOB: 09/06/1945 DOA: 11/29/2023     1 DOS: the patient was seen and examined on 11/30/2023   Brief hospital course: 78 y.o. year old male with medical history of hypertension, hyperlipidemia, alcohol use disorder, polysubstance use disorder, paroxysmal A-fib, with past medical history of stroke presenting to the ED with feeling unwell overall and abdominal pain.     Pt states states she was feeling unwell and has p.o. intake was low today decided to get evaluated.  He also developed epigastric pain that has resolved.  He denies any other symptoms such as coughing, dysuria, nausea or vomiting.     On arrival to the ED patient was noted to be HDS stable.  Lab work and imaging obtained.  CBC without leukocytosis and mild thrombocytopenia.  Ethanol level at 118, CMP with an AKI, hypokalemia, anion gap metabolic acidosis and elevated LFTs.  Ammonia level checked and normal, BHB elevated at 2.06, CK elevated at 501.  Lactic acid mildly elevated at 2.3.  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   TRH contacted for admission.  11/11.  Patient complaining of abdominal pain and poor appetite.  Patient does drink alcohol.  CT scan concerning for early cirrhosis versus hepatic steatosis.  Wants to try regular diet.  Heart rates in the 30s and 40s will hold Coreg .  Assessment and Plan: * Abdominal pain Unclear etiology.  Generalized in nature.  CT scan did not show any etiology.  Patient does not have ascites.  Try to advance diet and see how things go.  Advance diet and see what happens.  Bradycardia With atrial fibrillation.  Heart rate in the 30s and 40s when I saw him today.  Hold Coreg  and monitor heart rate.  Atrial fibrillation, chronic (HCC) Continue Eliquis  for anticoagulation.  Hold Coreg   secondary to bradycardia.  Alcohol abuse Alcohol withdrawal protocol continue folic acid  and thiamine .  No signs of withdrawal currently.  Patient also has thrombocytopenia, elevated liver function test and imaging that could go along with hepatic steatosis versus early cirrhosis.  Failure to thrive in adult Seen redosed with advance diet  Chronic diastolic CHF (congestive heart failure) (HCC) Seems euvolemic.  Patient on Farxiga , Lasix .  Holding Coreg   CKD stage 3a, GFR 45-59 ml/min (HCC) Continue to monitor  Hypokalemia Replace potassium orally and IV  Lactic acidosis Likely secondary to alcohol        Subjective: Patient complained of abdominal pain CT scan showing herniation of a nonobstructive loop of small bowel in the umbilical hernia.  Not having pain over that site.  Hepatic steatosis versus early cirrhosis seen on CT scan.  Physical Exam: Vitals:   11/30/23 1000 11/30/23 1030 11/30/23 1057 11/30/23 1200  BP: 124/68 106/61  120/81  Pulse: (!) 48 (!) 53  78  Resp: 13 17  20   Temp:   97.9 F (36.6 C) 98 F (36.7 C)  TempSrc:   Oral Oral  SpO2: 95% 94%  94%  Weight:      Height:       Physical Exam HENT:     Head: Normocephalic.     Mouth/Throat:     Pharynx: No oropharyngeal exudate.  Eyes:     General: Lids are normal.     Conjunctiva/sclera: Conjunctivae normal.  Cardiovascular:     Rate and Rhythm: Bradycardia present.  Rhythm irregularly irregular.     Heart sounds: Normal heart sounds, S1 normal and S2 normal.  Pulmonary:     Breath sounds: No decreased breath sounds, wheezing, rhonchi or rales.  Abdominal:     General: There is distension.     Palpations: Abdomen is soft.     Tenderness: There is generalized abdominal tenderness.     Comments: Umbilical hernia reducible and no pain over site.  Musculoskeletal:     Right lower leg: No swelling.     Left lower leg: No swelling.  Skin:    General: Skin is warm.     Findings: No rash.   Neurological:     Mental Status: He is alert and oriented to person, place, and time.     Comments: No tremor seen     Data Reviewed: CT scan reviewed Right upper quadrant consistent with hepatic steatosis, status postcholecystectomy Ultrasound of abdomen for ascites was negative Potassium 3.1, creatinine 1.3, AST 104, ALT 63, ammonia less than 13, lactic acid 0.9, white blood cell count 6.4, hemoglobin 12.7, platelet count 113 Family Communication: Caregiver at bedside  Disposition: Status is: Inpatient Remains inpatient appropriate because: Ultrasound of the abdomen negative for ascites.  Try to advance diet and see how it goes.  Advise no drinking.  Planned Discharge Destination: Will see how he does with physical therapy    Time spent: 28 minutes  Author: Charlie Patterson, MD 11/30/2023 12:26 PM  For on call review [email or web address].

## 2023-11-30 NOTE — ED Notes (Signed)
Pt eating breakfast tray at this time

## 2023-11-30 NOTE — Assessment & Plan Note (Signed)
 Seems euvolemic.  Patient on Farxiga , Lasix .  Holding Coreg 

## 2023-11-30 NOTE — ED Notes (Signed)
 Pt used urinal with help of this RN.  Pericare and clean dry disposable brief provided. Urine sample obtained.

## 2023-11-30 NOTE — Assessment & Plan Note (Signed)
 Continue Eliquis  for anticoagulation.  Hold Coreg  secondary to bradycardia.

## 2023-11-30 NOTE — Assessment & Plan Note (Signed)
 With atrial fibrillation.  Heart rate in the 30s and 40s when I saw him today.  Hold Coreg  and monitor heart rate.

## 2023-12-01 DIAGNOSIS — N1831 Chronic kidney disease, stage 3a: Secondary | ICD-10-CM | POA: Diagnosis not present

## 2023-12-01 DIAGNOSIS — E876 Hypokalemia: Secondary | ICD-10-CM | POA: Diagnosis not present

## 2023-12-01 DIAGNOSIS — E872 Acidosis, unspecified: Secondary | ICD-10-CM | POA: Diagnosis not present

## 2023-12-01 DIAGNOSIS — F1010 Alcohol abuse, uncomplicated: Secondary | ICD-10-CM | POA: Diagnosis not present

## 2023-12-01 LAB — COMPREHENSIVE METABOLIC PANEL WITH GFR
ALT: 63 U/L — ABNORMAL HIGH (ref 0–44)
AST: 92 U/L — ABNORMAL HIGH (ref 15–41)
Albumin: 4 g/dL (ref 3.5–5.0)
Alkaline Phosphatase: 82 U/L (ref 38–126)
Anion gap: 12 (ref 5–15)
BUN: 17 mg/dL (ref 8–23)
CO2: 31 mmol/L (ref 22–32)
Calcium: 9.7 mg/dL (ref 8.9–10.3)
Chloride: 92 mmol/L — ABNORMAL LOW (ref 98–111)
Creatinine, Ser: 1.09 mg/dL (ref 0.61–1.24)
GFR, Estimated: >60 mL/min (ref >60)
Glucose, Bld: 254 mg/dL — ABNORMAL HIGH (ref 70–99)
Potassium: 4.1 mmol/L (ref 3.5–5.1)
Sodium: 135 mmol/L (ref 135–145)
Total Bilirubin: 1.7 mg/dL — ABNORMAL HIGH (ref 0.0–1.2)
Total Protein: 7.4 g/dL (ref 6.5–8.1)

## 2023-12-01 LAB — CBC WITH DIFFERENTIAL/PLATELET
Abs Immature Granulocytes: 0.02 K/uL (ref 0.00–0.07)
Basophils Absolute: 0.1 K/uL (ref 0.0–0.1)
Basophils Relative: 1 %
Eosinophils Absolute: 0.3 K/uL (ref 0.0–0.5)
Eosinophils Relative: 4 %
HCT: 42.6 % (ref 39.0–52.0)
Hemoglobin: 13.9 g/dL (ref 13.0–17.0)
Immature Granulocytes: 0 %
Lymphocytes Relative: 20 %
Lymphs Abs: 1.4 K/uL (ref 0.7–4.0)
MCH: 29.8 pg (ref 26.0–34.0)
MCHC: 32.6 g/dL (ref 30.0–36.0)
MCV: 91.4 fL (ref 80.0–100.0)
Monocytes Absolute: 0.6 K/uL (ref 0.1–1.0)
Monocytes Relative: 9 %
Neutro Abs: 4.5 K/uL (ref 1.7–7.7)
Neutrophils Relative %: 66 %
Platelets: 114 K/uL — ABNORMAL LOW (ref 150–400)
RBC: 4.66 MIL/uL (ref 4.22–5.81)
RDW: 14.2 % (ref 11.5–15.5)
WBC: 6.8 K/uL (ref 4.0–10.5)
nRBC: 0 % (ref 0.0–0.2)

## 2023-12-01 LAB — GLUCOSE, CAPILLARY: Glucose-Capillary: 145 mg/dL — ABNORMAL HIGH (ref 70–99)

## 2023-12-01 MED ORDER — MORPHINE SULFATE (PF) 2 MG/ML IV SOLN: 2.0000 mg | INTRAVENOUS | Status: DC | PRN

## 2023-12-01 MED ORDER — HYDROCODONE-ACETAMINOPHEN 5-325 MG PO TABS: 1.0000 | ORAL_TABLET | ORAL | Status: DC | PRN

## 2023-12-01 MED ORDER — ENSURE PLUS HIGH PROTEIN PO LIQD
237.0000 mL | Freq: Two times a day (BID) | ORAL | Status: DC
  Administered 2023-12-01: 237 mL via ORAL

## 2023-12-01 NOTE — Plan of Care (Signed)

## 2023-12-01 NOTE — Progress Notes (Signed)
 Physical Therapy Treatment Patient Details Name: Christian Clark MRN: [id number] DOB: Jun 13, 1945 Today's Date: 12/01/2023   History of Present Illness Pt is a 78 y/o M admitted on 11/29/23 after presenting with c/o feeling overall unwell & abdominal pain. Pt is being treated for failure to thrive. PMH: HTN, HLD, alcohol use disorder, polysubstance use disorder, paroxysmal a-fib, stroke, GERD    PT Comments  Pt was long sitting in bed upon arrival. He is A and O x 4. Agreeable to session and remains cooperative throughout. Had recently been up/OOB with mobility tech but was eager to ambulate more. Pt tolerated ambulation 2 laps in the hallway with use of RW. No LOB or safety concerns. HR was stable. Acute PT will continue to follow and progress per current POC.     If plan is discharge home, recommend the following: A little help with walking and/or transfers;A little help with bathing/dressing/bathroom;Assistance with cooking/housework;Assist for transportation;Help with stairs or ramp for entrance     Equipment Recommendations  None recommended by PT (Pt was)       Precautions / Restrictions Precautions Precautions: Fall Recall of Precautions/Restrictions: Intact Restrictions Weight Bearing Restrictions Per Provider Order: No     Mobility  Bed Mobility Overal bed mobility: Modified Independent Bed Mobility: Supine to Sit  Supine to sit: Modified independent (Device/Increase time), HOB elevated, Used rails Sit to supine: Supervision   Transfers Overall transfer level: Needs assistance Equipment used: Rolling walker (2 wheels) Transfers: Sit to/from Stand Sit to Stand: Supervision  General transfer comment: no physical assistance required to stand or sit to/from EOB>< RW    Ambulation/Gait Ambulation/Gait assistance: Supervision Gait Distance (Feet): 400 Feet Assistive device: Rolling walker (2 wheels) Gait Pattern/deviations: Step-through pattern Gait velocity:  slightly decreased  General Gait Details: Pt was able to ambulate ~ 400 ft with RW without LOB or safety concern. pt had recently ambulated with mobility tech as well.    Balance Overall balance assessment: Needs assistance Sitting-balance support: No upper extremity supported, Feet supported Sitting balance-[name]: Good     Standing balance support: During functional activity, Bilateral upper extremity supported, Reliant on assistive device for balance Standing balance-[name]: Fair       Hotel Manager: No apparent difficulties  Cognition Arousal: Alert Behavior During Therapy: WFL for tasks assessed/performed   PT - Cognitive impairments: No apparent impairments    PT - Cognition Comments: Pt was A and O x 4. Extremely cooperative and motivated throughout Following commands: Intact      Cueing Cueing Techniques: Verbal cues, Tactile cues         Pertinent Vitals/Pain Pain Assessment Pain Assessment: 0-10 Pain Score: 2  Pain Location: R great toe Pain Descriptors / Indicators: Discomfort, Grimacing, Guarding Pain Intervention(s): Limited activity within patient's tolerance, Monitored during session, Repositioned, Premedicated before session     PT Goals (current goals can now be found in the care plan section) Acute Rehab PT Goals Patient Stated Goal: get better Progress towards PT goals: Progressing toward goals    Frequency    Min 2X/week       AM-PAC PT 6 Clicks Mobility   Outcome Measure  Help needed turning from your back to your side while in a flat bed without using bedrails?: None Help needed moving from lying on your back to sitting on the side of a flat bed without using bedrails?: A Little Help needed moving to and from a bed to a chair (including a wheelchair)?: A Little  Help needed standing up from a chair using your arms (e.g., wheelchair or bedside chair)?: A Little Help needed to walk in hospital room?:  A Little Help needed climbing 3-5 steps with a railing? : A Little 6 Click Score: 19    End of Session   Activity Tolerance: Patient tolerated treatment well Patient left: in bed;with call bell/phone within reach;with bed alarm set Nurse Communication: Mobility status PT Visit Diagnosis: Unsteadiness on feet (R26.81);Muscle weakness (generalized) (M62.81)     Time: 8449-8391 PT Time Calculation (min) (ACUTE ONLY): 18 min  Charges:    $Gait Training: 8-22 mins PT General Charges $$ ACUTE PT VISIT: 1 Visit                    Rankin Essex PTA 12/01/23, 4:42 PM

## 2023-12-01 NOTE — Progress Notes (Signed)
 PROGRESS NOTE    Christian Clark  FMW:[id number] DOB: 1945/06/28 DOA: 11/29/2023 PCP: Lenon Layman ORN, MD  Chief Complaint  Patient presents with   Abdominal Pain    Hospital Course:  Christian Clark is a 78 year old male with hypertension, hyperlipidemia, alcohol use disorder, polysubstance use disorder, paroxysmal A-fib, past medical history of CVA, who presents to the ED feeling unwell with abdominal pain.  Arrival to the ED was hemodynamically stable.  CBC revealed mild normocytic pia, EtOH level 118, CMP with AKI, hypokalemia, anion gap metabolic acidosis and elevated LFTs.  Hydroxybutyrate elevated at 2.06, CK5 01.  Lactic acid elevated at 2.3.  CT abdomen pelvis with contrast does not show acute findings but does reveal umbilical hernia with nonobstructive small bowel present and findings concerning for early cirrhosis.  TRH was consulted for admission.  Subjective: This morning patient reports his abdominal pain is entirely resolved.   Objective: Vitals:   12/01/23 0500 12/01/23 0752 12/01/23 1203 12/01/23 1614  BP:  127/61 120/73 (!) 140/76  Pulse:  (!) 48 64 (!) 46  Resp:  19 19 16   Temp:  97.8 F (36.6 C) 98.5 F (36.9 C) 98.1 F (36.7 C)  TempSrc:      SpO2:  94% 92% 97%  Weight: 98.9 kg     Height:        Intake/Output Summary (Last 24 hours) at 12/01/2023 1836 Last data filed at 12/01/2023 1614 Gross per 24 hour  Intake 480 ml  Output 1400 ml  Net -920 ml   Filed Weights   11/29/23 1523 12/01/23 0500  Weight: 101.2 kg 98.9 kg    Examination: General exam: Appears calm and comfortable, NAD  Respiratory system: No work of breathing, symmetric chest wall expansion Cardiovascular system: S1 & S2 heard, RRR.  Gastrointestinal system: Abdomen is nondistended, soft and nontender.  Neuro: Alert and oriented. No focal neurological deficits. Extremities: Symmetric, expected ROM Skin: No rashes, lesions Psychiatry: Demonstrates appropriate  judgement and insight. Mood & affect appropriate for situation.   Assessment & Plan:  Principal Problem:   Abdominal pain Active Problems:   Bradycardia   Atrial fibrillation, chronic (HCC)   Alcohol abuse   Failure to thrive in adult   GERD (gastroesophageal reflux disease)   Thrombocytopenia   Elevated liver function tests   Lactic acidosis   Hypokalemia   CKD stage 3a, GFR 45-59 ml/min (HCC)   Chronic diastolic CHF (congestive heart failure) (HCC)    Lactic acidosis Anion gap metabolic acidosis Beta hydroxybutyrate elevated - This was presumed secondary to EtOH, however patient is also taking farxiga  which has known side effect of euglycemic DKA.  Blood glucose 117 on arrival. - Patient has received adequate IV fluids and sliding scale insulin  with resolution in his acidosis. - Recommend discontinuing farxiga  for now  Abdominal pain, resolved - Unclear etiology.  Has resolved now. - CT scan without clear pathology.  Some ascites but not significant enough for paracentesis - Currently tolerating his diet - Abdominal pain may have been related to acidosis  Diabetes - Most recent hemoglobin A1c 6.9%.   --He is not taking insulin , or other intentional diabetic medications at home. - He does take farxiga  at home which I suspect is keeping his blood glucose well-controlled.  As above I am recommending we discontinue this medication for now in light of the acidosis on arrival.  I discussed with him directly that discontinuing this medication may cause his average blood glucoses to rise but we also  discussed that if he discontinues alcohol this will likely be a net neutral consequence. - If he continues to drink he will need alternative diabetes medications   Bradycardia - Complicated by atrial fibrillation - Hold Coreg  and continue to monitor heart rate  Atrial fibrillation, chronic - Continue Eliquis  - Hold Coreg  due to bradycardia as above  Alcohol abuse - Patient  acutely intoxicated on arrival.  Endorses ~9 shots of liquor daily - Currently CIWA has been 0, will continue to monitor.  72 hours will be tomorrow.  Remains high risk of withdrawal - As needed benzos for begins withdrawing  Hepatic steatosis vs cirrhosis - Patient has extensive history of alcohol abuse with elevated AST to ALT ratio, elevated bilirubin, thrombocytopenia, and ascites requiring paracentesis in the past.  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  Discussed options such as AA or naltrexone to assist him in achieving sobriety. - Patient reports he is highly motivated to discontinue alcohol - Will for to gastroenterology at discharge for continued surveillance  Chronic diastolic heart failure - Euvolemic currently - Patient is on Lasix , farxiga  and coreg  at home - Continue with home dose Lasix   CKD stage IIIa - Monitor closely - Renally dosed with a creatinine clearance of 66 when needed  Hypokalemia - Takes Lasix  at home.  Replace potassium as needed  BMI 31 Obesity class I - Outpatient follow up for lifestyle modification and risk factor management    DVT prophylaxis: eliquis    Code Status: Full Code Disposition:  Inpatient pending clinical resolution  Consultants:    Procedures:    Antimicrobials:  Anti-infectives (From admission, onward)    None       Data Reviewed: I have personally reviewed following labs and imaging studies CBC: Recent Labs  Lab 11/29/23 1535 11/30/23 0339 12/01/23 0959  WBC 6.6 6.4 6.8  NEUTROABS  --   --  4.5  HGB 14.2 12.7* 13.9  HCT 42.8 38.8* 42.6  MCV 89.7 90.2 91.4  PLT 130* 113* 114*   Basic Metabolic Panel: Recent Labs  Lab 11/29/23 1535 11/29/23 1751 11/30/23 0339 12/01/23 0959  NA 138  --  135 135  K 2.9*  --  3.1* 4.1  CL 86*  --  89* 92*  CO2 31  --  26 31  GLUCOSE 106*  --  117* 254*   BUN 28*  --  27* 17  CREATININE 1.40*  --  1.31* 1.09  CALCIUM  9.2  --  8.6* 9.7  MG  --  1.9  --   --   PHOS  --  4.0  --   --    GFR: Estimated Creatinine Clearance: 66.9 mL/min (by C-G formula based on SCr of 1.09 mg/dL). Liver Function Tests: Recent Labs  Lab 11/29/23 1535 11/30/23 0339 12/01/23 0959  AST 138* 104* 92*  ALT 82* 63* 63*  ALKPHOS 75 66 82  BILITOT 2.4* 3.0* 1.7*  PROT 9.2* 7.2 7.4  ALBUMIN 4.2 3.6 4.0   CBG: Recent Labs  Lab 11/30/23 0808 12/01/23 0752  GLUCAP 97 145*    No results found for this or any previous visit (from the past 240 hours).   Radiology Studies: US  ASCITES (ABDOMEN LIMITED) Addendum Date: 11/30/2023 ADDENDUM REPORT: 11/30/2023 10:00 ADDENDUM: SURVEY OF THE ABDOMINAL 4 QUADRANTS DEMONSTRATES NO SIGNIFICANT ASCITES. THEREFORE PARACENTESIS NOT PERFORMED. Electronically Signed   By: CHRISTELLA.  Shick M.D.   On: 11/30/2023  10:00   Result Date: 11/30/2023 CLINICAL DATA:  EXAM: LIMITED ABDOMINAL ULTRASOUND FOR ASCITES EVALUATION TECHNIQUE: Limited real-time sonography of all 4 quadrants of the abdomen was performed for evaluation of ascites. COMPARISON: 11/29/2023 CLINICAL HISTORY: Abdominal pain. FINDINGS: RIGHT UPPER QUADRANT: No ascites seen. LEFT UPPER QUADRANT: No ascites seen. RIGHT LOWER QUADRANT: No ascites seen. LEFT LOWER QUADRANT: No ascites seen. OTHER: Diffuse hepatic steatosis. IMPRESSION: 1. No ascites. 2. Diffuse hepatic steatosis. Electronically signed by: Rogelia Myers MD 11/30/2023 09:03 AM EST RP Workstation: [id number] Electronically Signed   By: CHRISTELLA.  Shick M.D.   On: 11/30/2023 10:00   US  Abdomen Limited RUQ (LIVER/GB) Result Date: 11/29/2023 EXAM: Right Upper Quadrant Abdominal Ultrasound 11/29/2023 09:46:45 PM TECHNIQUE: Real-time ultrasonography of the right upper quadrant of the abdomen was performed. COMPARISON: CT abdomen pelvis 11/29/2023. CLINICAL HISTORY: 221910 Elevated LFTs 221910 FINDINGS: LIVER: The liver  demonstrates increased echogenicity consistent with hepatic steatosis. Limited evaluation due to body habitus. No intrahepatic biliary ductal dilatation. No evidence of mass. BILIARY SYSTEM: Status post cholecystectomy. Common bile duct poorly visualized. RIGHT KIDNEY: The right kidney is grossly unremarkable in appearances without evidence of hydronephrosis, echogenic calculi or worrisome mass lesions. OTHER: No right upper quadrant ascites. IMPRESSION: 1. Hepatic steatosis. Please note limited evaluation for focal hepatic masses in a patient with hepatic steatosis due to decreased penetration of the acoustic ultrasound waves. 2. Status post cholecystectomy. 3. Common bile duct poorly visualized. Electronically signed by: Morgane Naveau MD 11/29/2023 09:54 PM EST RP Workstation: [id number]    Scheduled Meds:  apixaban   5 mg Oral BID   feeding supplement  237 mL Oral BID BM   folic acid   1 mg Oral Daily   furosemide   20 mg Oral Daily   multivitamin with minerals  1 tablet Oral Daily   pantoprazole   40 mg Oral Daily   potassium chloride   40 mEq Oral BID   sodium chloride  flush  3 mL Intravenous Q12H   thiamine   100 mg Oral Daily   Continuous Infusions:   LOS: 2 days  MDM: Patient is high risk for one or more organ failure.  They necessitate ongoing hospitalization for continued IV therapies and subsequent lab monitoring. Total time spent interpreting labs and vitals, reviewing the medical record, coordinating care amongst consultants and care team members, directly assessing and discussing care with the patient and/or family: 55 min  Kataleena Holsapple, DO [facility]  To contact the attending physician between 7A-7P please use Epic Chat. To contact the covering physician during after hours 7P-7A, please review Amion.  12/01/2023, 6:36 PM   *This document has been created with the assistance of dictation software. Please excuse typographical errors. *

## 2023-12-01 NOTE — Progress Notes (Signed)
 Mobility Specialist - Progress Note    12/01/23 1400  Mobility  Activity Ambulated with assistance;Respositioned in chair  Level of Assistance Modified independent, requires aide device or extra time  Assistive Device Front wheel walker  Distance Ambulated (ft) 180 ft  Range of Motion/Exercises Active  Activity Response Tolerated well  Mobility visit 1 Mobility  Mobility Specialist Start Time (ACUTE ONLY) 1258  Mobility Specialist Stop Time (ACUTE ONLY) 1309  Mobility Specialist Time Calculation (min) (ACUTE ONLY) 11 min   Pt was in recliner having lunch on RA with guest in the room upon entry. Pt agreed to mobility. Pt is able today to STS with a 2 WW independently. Pt ambulated well. Pt did not need a recovery break throughout activity. Pt is able to maintain pace and spatial awareness. After activity pt returned to room repositioned in recliner and needs in reach. Guest and DR. in room upon exit.  Clem Rodes Mobility Specialist 12/01/23, 2:35 PM

## 2023-12-02 DIAGNOSIS — R109 Unspecified abdominal pain: Secondary | ICD-10-CM

## 2023-12-02 DIAGNOSIS — E872 Acidosis, unspecified: Secondary | ICD-10-CM | POA: Diagnosis not present

## 2023-12-02 DIAGNOSIS — I5032 Chronic diastolic (congestive) heart failure: Secondary | ICD-10-CM | POA: Diagnosis not present

## 2023-12-02 DIAGNOSIS — K219 Gastro-esophageal reflux disease without esophagitis: Secondary | ICD-10-CM | POA: Diagnosis not present

## 2023-12-02 LAB — GLUCOSE, CAPILLARY: Glucose-Capillary: 139 mg/dL — ABNORMAL HIGH (ref 70–99)

## 2023-12-02 MED ORDER — ADULT MULTIVITAMIN W/MINERALS CH: 1.0000 | ORAL_TABLET | Freq: Every day | ORAL | 0 refills | Status: AC

## 2023-12-02 MED ORDER — TRAZODONE HCL 50 MG PO TABS
25.0000 mg | ORAL_TABLET | Freq: Every evening | ORAL | Status: DC | PRN
Start: 2023-12-02 — End: 2023-12-02

## 2023-12-02 NOTE — Discharge Summary (Signed)
 DISCHARGE SUMMARY    Christian Clark FMW:[id number] DOB: 1945-08-06 DOA: 11/29/2023  PCP: Lenon Layman ORN, MD  Admit date: 11/29/2023 Discharge date: 12/02/2023   Recommendations for Outpatient Follow-up:  Follow up with PCP in 1-2 recheck blood pressure and pulse and make further medication changes.  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  Arrival to the ED was hemodynamically stable.  CBC reveal anemia, EtOH level 118, CMP with AKI, hypokalemia, anion gap metabolic acidosis and elevated LFTs.  Hydroxybutyrate elevated at 2.06, CK5 01.  UA with ketones and high glucose.  Glucose on CMP 106.  Lactic acid elevated at 2.3.  CT abdomen pelvis with contrast does not show acute findings but does reveal umbilical hernia with nonobstructive small bowel present and findings concerning for early cirrhosis.  TRH was consulted for admission. Patient was admitted and volume resuscitated.  His abdominal pain gradually resolved.  He remained in house for continued monitoring as he was high risk for alcohol withdrawal.  He did not have any signs or symptoms of withdrawal and on 11/13 he is being discharged.  More detail below.  On day of discharge care plan was discussed directly with his patient, his bedside personal aide Rosa, his brother Vonn, his son Marolyn.  Anion gap metabolic acidosis - This was presumed secondary to EtOH, however patient is also taking farxiga  which has known side effect of euglycemic DKA.  Difficult to differentiate the 2 at this time however he did have significant ketones and glucose in his urine. - Acidosis resolved with IV fluids and sliding scale insulin . - Have discontinued farxiga  for now  Abdominal pain, resolved - Unclear etiology.  May have been  secondary to acute acidosis.  Resolved now. - CT scan without clear pathology.  Some ascites but not significant enough for paracentesis - Currently tolerating his diet   Diabetes - Most recent hemoglobin A1c 6.9%.   --He is not taking insulin , or other intentional diabetic medications at home. - He does take farxiga  at home which I suspect is keeping his blood glucose well-controlled.  As above, I am recommending we discontinue this medication for now in light of the acidosis on arrival.  I discussed with him directly that discontinuing this medication may cause his average blood glucoses to rise but we also discussed that if he discontinues alcohol this will likely be a net neutral consequence. - If he continues to drink he will need alternative diabetes medications.  Has been on metformin in the past but is not currently.  Does have CKD but GFR >60   Bradycardia - Complicated by atrial fibrillation - At home taking Coreg  25 mg twice daily.  Pulse consistently in the 40s and 50s here and has been unable to tolerate this medication.  Have recommended holding for now and discussed with his aide to take blood pressure and pulse daily and keep a log.  If pulse begins rising again at home can consider resuming Coreg  at a lower dose   Atrial fibrillation, chronic - Continue Eliquis  - Hold Coreg  due to bradycardia as above   Alcohol abuse - Patient acutely intoxicated on arrival.  Endorses ~9 shots of liquor daily - Completed 72 hours of in house monitoring, CIWA 0. - Counseled extensively on alcohol cessation.  At this time patient would like to try to go  cold turkey.  Discussed the possibility of naltrexone initiation outpatient if he desires. -- Continue multivitamin supplementation at DC   Hepatic steatosis vs cirrhosis - Patient has extensive history of alcohol abuse with elevated AST to ALT ratio, elevated bilirubin, thrombocytopenia, and ascites requiring paracentesis in the past.   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  Discussed options such as AA or naltrexone to assist him in achieving sobriety. - Patient reports he is highly motivated to discontinue alcohol - Will for to gastroenterology at discharge for continued surveillance   Chronic diastolic heart failure - Euvolemic currently - Patient is on Lasix , farxiga  and coreg  at home - Continue with home dose Lasix    CKD stage IIIa - Monitor closely - Renally dosed with a creatinine clearance of 66 when needed   Hypokalemia - Takes Lasix  at home.  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 - Outpatient follow up for lifestyle modification and risk factor management    Discharge Instructions  Discharge Instructions     Ambulatory referral to Gastroenterology   Complete by: As directed    What is the reason for referral?: Other Comment - cirrhosis surveillence   Call MD for:  difficulty breathing, headache or visual disturbances   Complete by: As directed    Call MD for:  persistant dizziness or light-headedness   Complete by: As directed    Call MD for:  persistant nausea and vomiting   Complete by: As directed    Call MD for:  severe uncontrolled pain   Complete by: As directed    Call MD for:  temperature >100.4   Complete by: As directed    Diet - low sodium heart healthy   Complete by: As directed    Discharge instructions   Complete by: As directed    While admitted we made some changes to your blood pressure medications. Please review your discharge medications closely to ensure you are taking the correct meds at home. Please take your blood pressure once a day and keep a log. See your primary care doctor in one week to review this log and make further medication changes.   Increase activity slowly   Complete by: As directed       Allergies as of 12/02/2023        Reactions   Shrimp Extract    Chocolate Hives, Itching   Dust Mite Extract    Iodine Hives   Iodine Strong    Peanut Oil Hives, Itching   Penicillins Hives   Shellfish Allergy Hives        Medication List     PAUSE taking these medications    carvedilol  25 MG tablet Wait to take this until your doctor or other care provider tells you to start again. Commonly known as: COREG  Take 1 tablet (25 mg total) by mouth 2 (two) times daily with a meal.       STOP taking these medications    cetirizine  10 MG tablet Commonly known as: ZYRTEC    dapagliflozin  propanediol 10 MG Tabs tablet Commonly known as: FARXIGA    hydrocortisone  cream 1 %   torsemide  20 MG tablet Commonly known as: DEMADEX        TAKE these medications    acetaminophen  500 MG tablet Commonly known as: TYLENOL  Take 1 tablet (500 mg total) by mouth every 6 (six) hours as needed for moderate pain or  mild pain.   atorvastatin  80 MG tablet Commonly known as: LIPITOR  Take 80 mg by mouth daily.   Eliquis  5 MG Tabs tablet Generic drug: apixaban  Take 1 tablet (5 mg total) by mouth 2 (two) times daily.   esomeprazole  40 MG capsule Commonly known as: NEXIUM  Take 1 capsule (40 mg total) by mouth daily.   furosemide  20 MG tablet Commonly known as: LASIX  Take 1 tablet (20 mg total) by mouth daily.   multivitamin with minerals Tabs tablet Take 1 tablet by mouth daily. Start taking on: December 03, 2023   traZODone  50 MG tablet Commonly known as: DESYREL  Take 1 tablet (50 mg total) by mouth at bedtime as needed for sleep.        Allergies  Allergen Reactions   Shrimp Extract    Chocolate Hives and Itching   Dust Mite Extract    Iodine Hives   Iodine Strong    Peanut Oil Hives and Itching   Penicillins Hives   Shellfish Allergy Hives    Consultations:    Procedures/Studies:     Discharge Exam: Vitals:   12/02/23 0847 12/02/23 1206  BP: 137/82 119/86  Pulse: (!) 45 (!) 54  Resp:  19 19  Temp: (!) 97.5 F (36.4 C) 97.8 F (36.6 C)  SpO2: 94% 94%   Vitals:   12/02/23 0444 12/02/23 0500 12/02/23 0847 12/02/23 1206  BP: 111/68  137/82 119/86  Pulse: (!) 53  (!) 45 (!) 54  Resp:   19 19  Temp: (!) 97.4 F (36.3 C)  (!) 97.5 F (36.4 C) 97.8 F (36.6 C)  TempSrc: Oral     SpO2: 95%  94% 94%  Weight:  98.6 kg    Height:        Constitutional:  Normal appearance. Non toxic-appearing.  HENT: Head Normocephalic and atraumatic.  Mucous membranes are moist.  Eyes:  Extraocular intact. Conjunctivae normal.  Cardiovascular: Rate and Rhythm: Normal rate and regular rhythm.  Pulmonary: Non labored, symmetric rise of chest wall.  Skin: warm and dry. not jaundiced.  Neurological: No focal deficit present. alert. Oriented.  Psychiatric: Mood and Affect congruent.    The results of significant diagnostics from this hospitalization (including imaging, microbiology, ancillary and laboratory) are listed below for reference.     Microbiology: No results found for this or any previous visit (from the past 240 hours).   Labs: BNP (last 3 results) Recent Labs    10/05/23 1731  BNP 236.9*   Basic Metabolic Panel: Recent Labs  Lab 11/29/23 1535 11/29/23 1751 11/30/23 0339 12/01/23 0959  NA 138  --  135 135  K 2.9*  --  3.1* 4.1  CL 86*  --  89* 92*  CO2 31  --  26 31  GLUCOSE 106*  --  117* 254*  BUN 28*  --  27* 17  CREATININE 1.40*  --  1.31* 1.09  CALCIUM  9.2  --  8.6* 9.7  MG  --  1.9  --   --   PHOS  --  4.0  --   --    Liver Function Tests: Recent Labs  Lab 11/29/23 1535 11/30/23 0339 12/01/23 0959  AST 138* 104* 92*  ALT 82* 63* 63*  ALKPHOS 75 66 82  BILITOT 2.4* 3.0* 1.7*  PROT 9.2* 7.2 7.4  ALBUMIN 4.2 3.6 4.0   Recent Labs  Lab 11/29/23 1535  LIPASE 46   Recent Labs  Lab 11/29/23 1739  AMMONIA <13  CBC: Recent Labs  Lab 11/29/23 1535 11/30/23 0339 12/01/23 0959  WBC 6.6 6.4 6.8  NEUTROABS  --   --  4.5  HGB 14.2  12.7* 13.9  HCT 42.8 38.8* 42.6  MCV 89.7 90.2 91.4  PLT 130* 113* 114*   Cardiac Enzymes: Recent Labs  Lab 11/29/23 1739  CKTOTAL 501*   BNP: Invalid input(s): POCBNP CBG: Recent Labs  Lab 11/30/23 0808 12/01/23 0752 12/02/23 0847  GLUCAP 97 145* 139*   D-Dimer No results for input(s): DDIMER in the last 72 hours. Hgb A1c No results for input(s): HGBA1C in the last 72 hours. Lipid Profile No results for input(s): CHOL, HDL, LDLCALC, TRIG, CHOLHDL, LDLDIRECT in the last 72 hours. Thyroid function studies No results for input(s): TSH, T4TOTAL, T3FREE, THYROIDAB in the last 72 hours.  Invalid input(s): FREET3 Anemia work up No results for input(s): VITAMINB12, FOLATE, FERRITIN, TIBC, IRON , RETICCTPCT in the last 72 hours. Urinalysis    Component Value Date/Time   COLORURINE YELLOW (A) 11/30/2023 0034   APPEARANCEUR CLEAR (A) 11/30/2023 0034   LABSPEC 1.014 11/30/2023 0034   PHURINE 5.0 11/30/2023 0034   GLUCOSEU >=500 (A) 11/30/2023 0034   HGBUR MODERATE (A) 11/30/2023 0034   BILIRUBINUR NEGATIVE 11/30/2023 0034   KETONESUR 5 (A) 11/30/2023 0034   PROTEINUR 30 (A) 11/30/2023 0034   NITRITE NEGATIVE 11/30/2023 0034   LEUKOCYTESUR NEGATIVE 11/30/2023 0034   Sepsis Labs Recent Labs  Lab 11/29/23 1535 11/30/23 0339 12/01/23 0959  WBC 6.6 6.4 6.8   Microbiology No results found for this or any previous visit (from the past 240 hours).   Time coordinating discharge: 32 min   SIGNED: Samrat Hayward, DO [facility] 12/02/2023, 2:05 PM Pager   If 7PM-7AM, please contact night-coverage

## 2023-12-02 NOTE — Plan of Care (Signed)
  Problem: Clinical Measurements: Goal: Will remain free from infection Outcome: Progressing Goal: Respiratory complications will improve Outcome: Progressing   Problem: Safety: Goal: Ability to remain free from injury will improve Outcome: Progressing   Problem: Skin Integrity: Goal: Risk for impaired skin integrity will decrease Outcome: Progressing   

## 2023-12-02 NOTE — Discharge Instructions (Signed)

## 2023-12-02 NOTE — TOC Initial Note (Addendum)
 Transition of Care [facility]) - Initial/Assessment Note    Patient Details  Name: Christian Clark MRN: [id number] Date of Birth: [birth year]  [facility]) CM/SW Contact:    Marinda Cooks, RN Phone Number: 12/02/2023, 9:39 AM  Clinical Narrative:                 This CM spoke with pt intorduced role  and completed Initial assessement. Pt A&O x4. Pt reports living in a  single family with  3 steps to enter through back door. Pt has family support provided by his (Son) Marsa Danzy that lives in [place].. Pt uses [facility] in [place]  & PCP is  Dr. Layman Piety located at the [facility] . Pt confirmed having DME at home that includes RW.  Pt reports not  having any  SNF rehab stays  prior to  this admission or  HH  in place . Pt denies having any  community resources coordinated currently or mental health or substance abuse services .  DC Planning :This CM spoke with pt in detail regarding DC Plan and recommendations for [facility] . Pt agreed with [facility] recommendation and was provided choice pt expressed he did not have a preference. This CM secured HH with [facility] when pt is medically cleared to dc Resources added to AVS for substance abuse , also this CM recommended pt call the back of his insurance card to get specific info on in network substance abuse / mental health resources . TOC will cont to follow dc planning / care coordination and update as applicable.    Expected Discharge Plan and Services      Home with [facility]          Prior Living Arrangements/Services  Pt from Home   Activities of Daily Living   ADL Screening (condition at time of admission) Independently performs ADLs?: No Does the patient have a NEW difficulty with bathing/dressing/toileting/self-feeding that is expected to last >3 days?: No Does the patient have a NEW difficulty with getting in/out of bed, walking, or climbing stairs that is expected to last >3 days?: No Does the patient  have a NEW difficulty with communication that is expected to last >3 days?: No Is the patient deaf or have difficulty hearing?: No Does the patient have difficulty seeing, even when wearing glasses/contacts?: No Does the patient have difficulty concentrating, remembering, or making decisions?: No  Permission Sought/Granted                  Emotional Assessment              Admission diagnosis:  Hypokalemia [E87.6] Starvation ketoacidosis [T73.0XXA, E87.29] Failure to thrive in adult [R62.7] Atrial fibrillation with slow ventricular response (HCC) [I48.91] AKI (acute kidney injury) [N17.9] EKG, abnormal [R94.31] Patient Active Problem List   Diagnosis Date Noted   Abdominal pain 11/30/2023   Bradycardia 11/30/2023   Thrombocytopenia 11/30/2023   Elevated liver function tests 11/30/2023   Lactic acidosis 11/30/2023   Hypokalemia 11/30/2023   CKD stage 3a, GFR 45-59 ml/min (HCC) 11/30/2023   Chronic diastolic CHF (congestive heart failure) (HCC) 11/30/2023   Failure to thrive in adult 11/29/2023   Acute exacerbation of CHF (congestive heart failure) (HCC) 10/05/2023   Acute CVA (cerebrovascular accident) (HCC) 06/04/2021   CVA (cerebral vascular accident) (HCC) 06/03/2021   Iron  deficiency anemia due to chronic blood loss 06/03/2021   GERD (gastroesophageal reflux disease)    Frequent falls 11/28/2020   Atrial  fibrillation, chronic (HCC) 11/28/2020   Sepsis (HCC) 11/28/2020   Alcohol abuse 11/28/2020   PCP:  Lenon Layman ORN, MD Pharmacy:   [id number] - [place], [place] - [address] AT [facility] [address] [place] [address] Phone: [phone] Fax: [phone]  [facility] - [place], [place] - [address] [place] [address] Phone: [phone] Fax: [phone]     Social Drivers of Health (SDOH) Social History: SDOH Screenings   Food Insecurity: No Food  Insecurity (11/30/2023)  Housing: Low Risk  (11/30/2023)  Recent Concern: Housing - High Risk (10/06/2023)  Transportation Needs: No Transportation Needs (11/30/2023)  Utilities: Not At Risk (11/30/2023)  Financial Resource Strain: Low Risk  (11/18/2023)   Received from [facility]  Social Connections: Moderately Integrated (11/30/2023)  Tobacco Use: Medium Risk (11/29/2023)   SDOH Interventions:     Readmission Risk Interventions     No data to display

## 2023-12-02 NOTE — Progress Notes (Signed)
 Mobility Specialist - Progress Note  During mobility: HR-84, SpO2-96%   12/02/23 0900  Mobility  Activity Ambulated with assistance;Stood at bedside;Respositioned in chair  Level of Assistance Standby assist, set-up cues, supervision of patient - no hands on  Assistive Device Front wheel walker  Distance Ambulated (ft) 480 ft  Range of Motion/Exercises Active  Activity Response Tolerated well  Mobility visit 1 Mobility  Mobility Specialist Start Time (ACUTE ONLY) 0847  Mobility Specialist Stop Time (ACUTE ONLY) [id number]  Mobility Specialist Time Calculation (min) (ACUTE ONLY) 29 min   Pt was supine in bed with HOB elevated on RA upon entry. Pt agreed to mobility. Pt is able today to get to the EOB independently with bed features. Pt is able today to STS with minA CGA and 2 WW. Pt did state that pain in right foot is present. Pt ambulated well. Pain in hand during ambulation. Pt used recovery break to check vitals throughout activity. After activity pt returned to room and repositioned in the recliner with needs in reach and chair alarm on and nurse in room upon exit.  Clem Rodes Mobility Specialist 12/02/23, 9:27 AM

## 2023-12-02 NOTE — Progress Notes (Signed)
 Physical Therapy Treatment Patient Details Name: Christian Clark MRN: [id number] DOB: 1945-05-11 Today's Date: 12/02/2023   History of Present Illness Pt is a 78 y/o M admitted on 11/29/23 after presenting with c/o feeling overall unwell & abdominal pain. Pt is being treated for failure to thrive. PMH: HTN, HLD, alcohol use disorder, polysubstance use disorder, paroxysmal a-fib, stroke, GERD    PT Comments  Pt was seated in recliner upon arrival. He is A but presents slightly more lethargic than previous date.  I took a sleeping pill last night and I think I'm still feeling foggy from it. He remains oriented and motivated.  I hope I go home today. Pt was able to stand and ambulate > 400 ft with RW. Only requires vcs for posture correction and staying inside of RW for better leverage from BUEs. Pt also performed ascending/descending 4 stair to simulate home entry. Lots of vcing for improved sequencing and technique for stairs. Pt's LLE  strength > than RLE. Pt has R knee OA (per pt). Recommend pt have physical assistance available whenever he is ascending/descending his home stairs. Overall continues to progress towards PT goals and will benefit from continued skilled PT at DC to maximize his independence and safety with all ADLs while decreasing caregiver burden.    If plan is discharge home, recommend the following: A little help with walking and/or transfers;A little help with bathing/dressing/bathroom;Assistance with cooking/housework;Assist for transportation;Help with stairs or ramp for entrance     Equipment Recommendations  None recommended by PT (pt endorses having all DME needs met already)       Precautions / Restrictions Precautions Precautions: Fall Recall of Precautions/Restrictions: Intact Restrictions Weight Bearing Restrictions Per Provider Order: No     Mobility  Bed Mobility  General bed mobility comments: pt was in recliner pre/post session     Transfers Overall transfer level: Needs assistance Equipment used: Rolling walker (2 wheels) Transfers: Sit to/from Stand Sit to Stand: Supervision  General transfer comment: Vcs for technique improvements only. No physical lifting assist to stand or sit    Ambulation/Gait Ambulation/Gait assistance: Supervision, Contact guard assist Gait Distance (Feet): 450 Feet Assistive device: Rolling walker (2 wheels) Gait Pattern/deviations: Step-through pattern Gait velocity: slightly decreased  General Gait Details: Pt continues to demonstrate safe steady gait kinematics however was cued for improved posture and staying inside RW more to promote more upright posture.   Stairs Stairs: Yes Stairs assistance: Min assist, Contact guard assist Stair Management: Two rails, Step to pattern Number of Stairs: 4 General stair comments: Pt performed ascending/descending stairs 2 x 4 with BUE support. performed step to pattern with leading with LLE (up) leading RLE coming down. pt struggles to remember proper sequencing. RLE knee deficits (OA?) make it unsafe for pt to perform with leading up with RLE.  author wrote down proper sequencing for home use    Balance Overall balance assessment: Needs assistance Sitting-balance support: No upper extremity supported, Feet supported Sitting balance-[name]: Good     Standing balance support: During functional activity, Bilateral upper extremity supported, Reliant on assistive device for balance Standing balance-[name]: Fair      Hotel Manager: No apparent difficulties  Cognition Arousal: Alert Behavior During Therapy: WFL for tasks assessed/performed   PT - Cognitive impairments: No apparent impairments    PT - Cognition Comments: Pt was A and O x 4. Extremely cooperative and motivated throughout Following commands: Intact      Cueing Cueing Techniques: Verbal cues, Tactile  cues     General Comments  General comments (skin integrity, edema, etc.): pt requesting to get a bath. RN staff made aware      Pertinent Vitals/Pain Pain Assessment Pain Assessment: 0-10 Pain Score: 2  Pain Location: R great toe Pain Descriptors / Indicators: Discomfort, Grimacing, Guarding Pain Intervention(s): Limited activity within patient's tolerance, Monitored during session, Premedicated before session, Repositioned     PT Goals (current goals can now be found in the care plan section) Acute Rehab PT Goals Patient Stated Goal: get better Progress towards PT goals: Progressing toward goals    Frequency    Min 2X/week       AM-PAC PT 6 Clicks Mobility   Outcome Measure  Help needed turning from your back to your side while in a flat bed without using bedrails?: None Help needed moving from lying on your back to sitting on the side of a flat bed without using bedrails?: A Little Help needed moving to and from a bed to a chair (including a wheelchair)?: A Little Help needed standing up from a chair using your arms (e.g., wheelchair or bedside chair)?: A Little Help needed to walk in hospital room?: A Little Help needed climbing 3-5 steps with a railing? : A Little 6 Click Score: 19    End of Session Equipment Utilized During Treatment: Gait belt Activity Tolerance: Patient tolerated treatment well Patient left: in chair;with call bell/phone within reach;with chair alarm set Nurse Communication: Mobility status PT Visit Diagnosis: Unsteadiness on feet (R26.81);Muscle weakness (generalized) (M62.81)     Time: 9062-8998 PT Time Calculation (min) (ACUTE ONLY): 24 min  Charges:    $Gait Training: 8-22 mins $Therapeutic Activity: 8-22 mins PT General Charges $$ ACUTE PT VISIT: 1 Visit                    Rankin Essex PTA 12/02/23, 10:43 AM

## 2023-12-02 NOTE — Care Management Important Message (Signed)
 Important Message  Patient Details  Name: Christian Clark MRN: [id number] Date of Birth: 04/10/1945   Important Message Given:  Yes - Medicare IM     Damiel Barthold W, CMA 12/02/2023, 2:49 PM
